# Patient Record
Sex: Male | Born: 1945 | Race: Black or African American | Hispanic: No | Smoking: Current every day smoker
Health system: Southern US, Community
[De-identification: ages and names within clinical notes are randomized; demographics above are authoritative.]

## PROBLEM LIST (undated history)

## (undated) DIAGNOSIS — R911 Solitary pulmonary nodule: Secondary | ICD-10-CM

## (undated) DIAGNOSIS — R918 Other nonspecific abnormal finding of lung field: Secondary | ICD-10-CM

## (undated) DIAGNOSIS — Z923 Personal history of irradiation: Secondary | ICD-10-CM

## (undated) DIAGNOSIS — I639 Cerebral infarction, unspecified: Secondary | ICD-10-CM

## (undated) DIAGNOSIS — R0902 Hypoxemia: Secondary | ICD-10-CM

## (undated) DIAGNOSIS — J449 Chronic obstructive pulmonary disease, unspecified: Secondary | ICD-10-CM

## (undated) DIAGNOSIS — I10 Essential (primary) hypertension: Secondary | ICD-10-CM

## (undated) DIAGNOSIS — E785 Hyperlipidemia, unspecified: Secondary | ICD-10-CM

## (undated) DIAGNOSIS — C3490 Malignant neoplasm of unspecified part of unspecified bronchus or lung: Secondary | ICD-10-CM

## (undated) HISTORY — PX: TONSILLECTOMY: SUR1361

## (undated) HISTORY — DX: Hypoxemia: R09.02

## (undated) HISTORY — DX: Chronic obstructive pulmonary disease, unspecified: J44.9

## (undated) HISTORY — DX: Cerebral infarction, unspecified: I63.9

## (undated) HISTORY — DX: Solitary pulmonary nodule: R91.1

## (undated) HISTORY — DX: Essential (primary) hypertension: I10

## (undated) HISTORY — DX: Hyperlipidemia, unspecified: E78.5

## (undated) HISTORY — DX: Malignant neoplasm of unspecified part of unspecified bronchus or lung: C34.90

## (undated) HISTORY — DX: Personal history of irradiation: Z92.3

## (undated) HISTORY — DX: Other nonspecific abnormal finding of lung field: R91.8

---

## 1997-10-20 ENCOUNTER — Emergency Department (HOSPITAL_COMMUNITY): Admission: EM | Admit: 1997-10-20 | Discharge: 1997-10-20 | Payer: Self-pay | Admitting: Emergency Medicine

## 1998-03-05 ENCOUNTER — Inpatient Hospital Stay (HOSPITAL_COMMUNITY): Admission: EM | Admit: 1998-03-05 | Discharge: 1998-03-10 | Payer: Self-pay | Admitting: Emergency Medicine

## 1998-03-12 ENCOUNTER — Encounter: Admission: RE | Admit: 1998-03-12 | Discharge: 1998-03-12 | Payer: Self-pay | Admitting: Hematology and Oncology

## 1998-05-25 ENCOUNTER — Inpatient Hospital Stay (HOSPITAL_COMMUNITY): Admission: EM | Admit: 1998-05-25 | Discharge: 1998-05-26 | Payer: Self-pay | Admitting: *Deleted

## 1998-05-25 ENCOUNTER — Encounter: Payer: Self-pay | Admitting: Internal Medicine

## 1998-05-25 ENCOUNTER — Encounter: Payer: Self-pay | Admitting: Emergency Medicine

## 1998-07-01 ENCOUNTER — Encounter: Admission: RE | Admit: 1998-07-01 | Discharge: 1998-07-01 | Payer: Self-pay | Admitting: Hematology and Oncology

## 1999-01-03 ENCOUNTER — Encounter: Payer: Self-pay | Admitting: Emergency Medicine

## 1999-01-03 ENCOUNTER — Emergency Department (HOSPITAL_COMMUNITY): Admission: EM | Admit: 1999-01-03 | Discharge: 1999-01-03 | Payer: Self-pay | Admitting: Emergency Medicine

## 2002-03-09 ENCOUNTER — Encounter: Payer: Self-pay | Admitting: *Deleted

## 2002-03-09 ENCOUNTER — Emergency Department (HOSPITAL_COMMUNITY): Admission: EM | Admit: 2002-03-09 | Discharge: 2002-03-09 | Payer: Self-pay | Admitting: Emergency Medicine

## 2003-03-23 ENCOUNTER — Emergency Department (HOSPITAL_COMMUNITY): Admission: EM | Admit: 2003-03-23 | Discharge: 2003-03-23 | Payer: Self-pay | Admitting: Emergency Medicine

## 2005-02-09 ENCOUNTER — Ambulatory Visit (HOSPITAL_COMMUNITY): Admission: RE | Admit: 2005-02-09 | Discharge: 2005-02-09 | Payer: Self-pay | Admitting: Internal Medicine

## 2005-02-09 ENCOUNTER — Ambulatory Visit: Payer: Self-pay | Admitting: Internal Medicine

## 2005-08-03 ENCOUNTER — Encounter: Payer: Self-pay | Admitting: Emergency Medicine

## 2005-08-31 ENCOUNTER — Emergency Department (HOSPITAL_COMMUNITY): Admission: EM | Admit: 2005-08-31 | Discharge: 2005-08-31 | Payer: Self-pay | Admitting: Emergency Medicine

## 2006-02-16 ENCOUNTER — Emergency Department (HOSPITAL_COMMUNITY): Admission: EM | Admit: 2006-02-16 | Discharge: 2006-02-16 | Payer: Self-pay | Admitting: Emergency Medicine

## 2007-05-02 ENCOUNTER — Emergency Department (HOSPITAL_COMMUNITY): Admission: EM | Admit: 2007-05-02 | Discharge: 2007-05-02 | Payer: Self-pay | Admitting: Emergency Medicine

## 2007-06-14 ENCOUNTER — Ambulatory Visit: Payer: Self-pay | Admitting: Internal Medicine

## 2007-06-14 DIAGNOSIS — I10 Essential (primary) hypertension: Secondary | ICD-10-CM | POA: Insufficient documentation

## 2007-06-14 LAB — CONVERTED CEMR LAB
Bilirubin Urine: NEGATIVE
Chloride: 102 meq/L (ref 96–112)
Creatinine, Ser: 0.9 mg/dL (ref 0.40–1.50)
Glucose, Urine, Semiquant: NEGATIVE
Potassium: 4.2 meq/L (ref 3.5–5.3)
Specific Gravity, Urine: 1.015
Urobilinogen, UA: NEGATIVE
pH: 6.5

## 2007-06-22 ENCOUNTER — Ambulatory Visit: Payer: Self-pay | Admitting: Internal Medicine

## 2007-07-06 ENCOUNTER — Ambulatory Visit: Payer: Self-pay | Admitting: Internal Medicine

## 2007-07-25 ENCOUNTER — Ambulatory Visit: Payer: Self-pay | Admitting: *Deleted

## 2007-08-03 ENCOUNTER — Ambulatory Visit: Payer: Self-pay | Admitting: Internal Medicine

## 2007-08-17 ENCOUNTER — Ambulatory Visit: Payer: Self-pay | Admitting: Internal Medicine

## 2007-12-11 ENCOUNTER — Encounter (INDEPENDENT_AMBULATORY_CARE_PROVIDER_SITE_OTHER): Payer: Self-pay | Admitting: *Deleted

## 2007-12-25 ENCOUNTER — Telehealth (INDEPENDENT_AMBULATORY_CARE_PROVIDER_SITE_OTHER): Payer: Self-pay | Admitting: Internal Medicine

## 2007-12-26 ENCOUNTER — Ambulatory Visit: Payer: Self-pay | Admitting: Internal Medicine

## 2008-01-03 ENCOUNTER — Encounter (INDEPENDENT_AMBULATORY_CARE_PROVIDER_SITE_OTHER): Payer: Self-pay | Admitting: Internal Medicine

## 2008-01-22 ENCOUNTER — Encounter (INDEPENDENT_AMBULATORY_CARE_PROVIDER_SITE_OTHER): Payer: Self-pay | Admitting: *Deleted

## 2008-02-16 ENCOUNTER — Emergency Department (HOSPITAL_COMMUNITY): Admission: EM | Admit: 2008-02-16 | Discharge: 2008-02-16 | Payer: Self-pay | Admitting: Emergency Medicine

## 2010-02-05 ENCOUNTER — Inpatient Hospital Stay (HOSPITAL_COMMUNITY)
Admission: EM | Admit: 2010-02-05 | Discharge: 2010-03-09 | Payer: Self-pay | Source: Home / Self Care | Attending: Internal Medicine | Admitting: Internal Medicine

## 2010-02-05 ENCOUNTER — Ambulatory Visit: Payer: Self-pay | Admitting: Cardiology

## 2010-02-05 ENCOUNTER — Encounter (INDEPENDENT_AMBULATORY_CARE_PROVIDER_SITE_OTHER): Payer: Self-pay | Admitting: Internal Medicine

## 2010-02-06 ENCOUNTER — Encounter (INDEPENDENT_AMBULATORY_CARE_PROVIDER_SITE_OTHER): Payer: Self-pay | Admitting: Internal Medicine

## 2010-02-10 ENCOUNTER — Encounter (INDEPENDENT_AMBULATORY_CARE_PROVIDER_SITE_OTHER): Payer: Self-pay | Admitting: Internal Medicine

## 2010-02-18 ENCOUNTER — Telehealth (INDEPENDENT_AMBULATORY_CARE_PROVIDER_SITE_OTHER): Payer: Self-pay | Admitting: Internal Medicine

## 2010-03-03 DIAGNOSIS — I69991 Dysphagia following unspecified cerebrovascular disease: Secondary | ICD-10-CM | POA: Insufficient documentation

## 2010-03-03 DIAGNOSIS — I69959 Hemiplegia and hemiparesis following unspecified cerebrovascular disease affecting unspecified side: Secondary | ICD-10-CM | POA: Insufficient documentation

## 2010-04-27 ENCOUNTER — Ambulatory Visit: Admit: 2010-04-27 | Payer: Self-pay | Admitting: Internal Medicine

## 2010-05-06 NOTE — Progress Notes (Signed)
Summary: HAD A STROKE  Phone Note Other Incoming Call back at [phone] OR [phone]   Caller: STEPDAUGHTER- SOPHIA HARPER Summary of Call: MULBERRY PT. MS HARPER CALLED TO LET YOU KNOW THAT MR Mcauliff IN [place], HE HAD A STROKE. SHE SAYS THAT IF YOU NEED TO TO CALL HER YOU CAN. Initial call taken by: Leodis Rains,  February 18, 2010 9:23 AM  Follow-up for Phone Call        Was admitted 02/05/10 and is still in the hospital, will be going to rehab after discharge.  Need to get him into rehab -- is there anything we can do to help?  Has no medicare or medicaid, can only go for two weeks then needs to go to another facility.  Only has use of of right side.  Is working with social workers at the hospital, but could use any assistance available to get him placed.  Hospital records are on your desk. Follow-up by: Dutch Quint RN,  February 18, 2010 10:11 AM  Additional Follow-up for Phone Call Additional follow up Details #1::        They can apply for disability and see if they can get help through that, but I believe that will take a while.  I believe someone has to be disabled for a period of time before they can get Medicaid or Medicare, but it is worth a try--I'm assuming they are already looking into this.  I am not aware of any place in particular to send him. He has not been here in over 2 years, so I am unsure as to what his family support system is Additional Follow-up by: Julieanne Manson MD,  February 19, 2010 12:08 PM    Additional Follow-up for Phone Call Additional follow up Details #2::    Left message on answer machine for [name] to return call. Gaylyn Cheers RN  February 19, 2010 5:24 PM  Spoke with Joretta Bachelor gave her info Dr. Delrae Alfred had suggested. Also recommended they work directly with the hospital social worker, which she said they have.   Follow-up by: Gaylyn Cheers RN,  February 22, 2010 12:54 PM

## 2010-05-12 NOTE — Letter (Signed)
Summary: Discharge Summary  Discharge Summary   Imported By: Arta Bruce 05/06/2010 10:54:17  _____________________________________________________________________  External Attachment:    Type:   Image     Comment:   External Document

## 2010-06-01 ENCOUNTER — Encounter (INDEPENDENT_AMBULATORY_CARE_PROVIDER_SITE_OTHER): Payer: Self-pay | Admitting: Internal Medicine

## 2010-06-01 ENCOUNTER — Telehealth (INDEPENDENT_AMBULATORY_CARE_PROVIDER_SITE_OTHER): Payer: Self-pay | Admitting: Internal Medicine

## 2010-06-08 ENCOUNTER — Telehealth (INDEPENDENT_AMBULATORY_CARE_PROVIDER_SITE_OTHER): Payer: Self-pay | Admitting: Internal Medicine

## 2010-06-10 NOTE — Progress Notes (Signed)
Summary: Needs meds refills s/p nursing home discharge  Phone Note Call from Patient   Summary of Call: Daughter Sheryle Spray called re father -- he currently hasMedicaid, only lasting until April.  Was just discharged from the nursing home today.  Had stroke in November 2011.  Needs medications refilled -- has only two more pills left of medications.  Is on blood-thinners and blood pressure medications.  Very upset over father's status re health -- Dr. Delrae Alfred had no appointments available this week -- placed in open appointment with Dr. Clelia Croft for 06/02/10.  Initial call taken by: Dutch Quint RN,  June 01, 2010 3:30 PM  Follow-up for Phone Call        This is going to be difficult for Dr. Clelia Croft to get through his records.  Can they not just bring in his meds and we can fill until I have an appt.--if all they need is med refills?  Called and spoke with Aggie Cosier to see if this could be arranged. Follow-up by: Julieanne Manson MD,  June 02, 2010 9:20 AM  Additional Follow-up for Phone Call Additional follow up Details #1::        Pt. arrived with daughter.  Spoke with daughter, pt. only c/o currently is feeling cold.  Discharge medications were ASA 81mg ., HCTZ 25 mg., Lisinopril 10 mg., Toprol XL 25 mg., Plavix 75 mg. and Zocor 40 mg. - all once daily.   Meds per written Rx are as follows:  Aspirin 81 mg. 1 tab by mouth once daily Tylenol 325 mg. 2 tabs by mouth four times a day if needed HCTZ 25 mg. 1 tab by mouth once daily Prinivil 10 mg. 1 tab by mouth once daily Zocor 40 mg. 1 tab by mouth once daily Toprol XL 25 mg. 1 tab by mouth once daily Plavix 75 mg. 1 tab by mouth once daily  They use [facility] on [address].   Additional Follow-up by: Dutch Quint RN,  June 02, 2010 10:25 AM    New/Updated Medications: ASPIR-LOW 81 MG TBEC (ASPIRIN) 1 tab by mouth with meal daily HYDROCHLOROTHIAZIDE 25 MG TABS (HYDROCHLOROTHIAZIDE) 1 tab by mouth in morning daily LISINOPRIL 10 MG  TABS (LISINOPRIL) 1 tab by mouth daily TOPROL XL 25 MG XR24H-TAB (METOPROLOL SUCCINATE) 1 tab by mouth daily PLAVIX 75 MG TABS (CLOPIDOGREL BISULFATE) 1 tab by mouth daily PRAVASTATIN SODIUM 40 MG TABS (PRAVASTATIN SODIUM) 1 tab by mouth daily Prescriptions: PRAVASTATIN SODIUM 40 MG TABS (PRAVASTATIN SODIUM) 1 tab by mouth daily  #30 x 4   Entered and Authorized by:   Julieanne Manson MD   Signed by:   Julieanne Manson MD on 06/02/2010   Method used:   Electronically to        [facility] [phone]* (retail)       [address]       [place], [place]  [address]       Ph: [phone]       Fax: [phone]   RxID:   [id number] PLAVIX 75 MG TABS (CLOPIDOGREL BISULFATE) 1 tab by mouth daily  #30 x 4   Entered and Authorized by:   Julieanne Manson MD   Signed by:   Julieanne Manson MD on 06/02/2010   Method used:   Electronically to        [facility] [address] [phone]* (retail)       [address]       [place], [place]  [address]  Ph: [phone]       Fax: [phone]   RxID:   [id number] TOPROL XL 25 MG XR24H-TAB (METOPROLOL SUCCINATE) 1 tab by mouth daily  #30 x 4   Entered and Authorized by:   Julieanne Manson MD   Signed by:   Julieanne Manson MD on 06/02/2010   Method used:   Electronically to        [facility] [phone]* (retail)       [address]       [place], [place]  [address]       Ph: [phone]       Fax: [phone]   RxID:   [id number] LISINOPRIL 10 MG TABS (LISINOPRIL) 1 tab by mouth daily  #30 x 4   Entered and Authorized by:   Julieanne Manson MD   Signed by:   Julieanne Manson MD on 06/02/2010   Method used:   Electronically to        [facility] [phone]* (retail)       [address]       [place], [place]  [address]       Ph: [phone]       Fax: [phone]   RxID:   [id number] HYDROCHLOROTHIAZIDE 25 MG TABS (HYDROCHLOROTHIAZIDE) 1 tab by mouth in morning daily  #30 x 4   Entered and  Authorized by:   Julieanne Manson MD   Signed by:   Julieanne Manson MD on 06/02/2010   Method used:   Electronically to        [facility] [address] [phone]* (retail)       [address]       [place], [place]  [address]       Ph: [phone]       Fax: [phone]   RxID:   [id number] ASPIR-LOW 81 MG TBEC (ASPIRIN) 1 tab by mouth with meal daily  #30 x 4   Entered and Authorized by:   Julieanne Manson MD   Signed by:   Julieanne Manson MD on 06/02/2010   Method used:   Electronically to        [facility] [address] [phone]* (retail)       [address]       [place], [place]  [address]       Ph: [phone]       Fax: [phone]   RxID:   [id number]

## 2010-06-10 NOTE — Letter (Signed)
Summary: SCRIPT  SCRIPT   Imported By: Arta Bruce 06/02/2010 12:17:39  _____________________________________________________________________  External Attachment:    Type:   Image     Comment:   External Document

## 2010-06-11 ENCOUNTER — Encounter (INDEPENDENT_AMBULATORY_CARE_PROVIDER_SITE_OTHER): Payer: Self-pay | Admitting: Internal Medicine

## 2010-06-11 ENCOUNTER — Encounter: Payer: Self-pay | Admitting: Internal Medicine

## 2010-06-11 DIAGNOSIS — M542 Cervicalgia: Secondary | ICD-10-CM | POA: Insufficient documentation

## 2010-06-11 DIAGNOSIS — F17200 Nicotine dependence, unspecified, uncomplicated: Secondary | ICD-10-CM | POA: Insufficient documentation

## 2010-06-11 DIAGNOSIS — E7800 Pure hypercholesterolemia, unspecified: Secondary | ICD-10-CM | POA: Insufficient documentation

## 2010-06-11 LAB — CONVERTED CEMR LAB: Blood Glucose, Fingerstick: 74

## 2010-06-15 DIAGNOSIS — J4489 Other specified chronic obstructive pulmonary disease: Secondary | ICD-10-CM | POA: Insufficient documentation

## 2010-06-15 DIAGNOSIS — J449 Chronic obstructive pulmonary disease, unspecified: Secondary | ICD-10-CM | POA: Insufficient documentation

## 2010-06-15 LAB — CBC
HCT: 39 % (ref 39.0–52.0)
HCT: 44.3 % (ref 39.0–52.0)
HCT: 47.5 % (ref 39.0–52.0)
HCT: 48.8 % (ref 39.0–52.0)
HCT: 49.3 % (ref 39.0–52.0)
HCT: 49.7 % (ref 39.0–52.0)
HCT: 50.9 % (ref 39.0–52.0)
Hemoglobin: 14.9 g/dL (ref 13.0–17.0)
Hemoglobin: 15.2 g/dL (ref 13.0–17.0)
Hemoglobin: 15.8 g/dL (ref 13.0–17.0)
Hemoglobin: 16.8 g/dL (ref 13.0–17.0)
Hemoglobin: 17 g/dL (ref 13.0–17.0)
Hemoglobin: 17.1 g/dL — ABNORMAL HIGH (ref 13.0–17.0)
Hemoglobin: 17.3 g/dL — ABNORMAL HIGH (ref 13.0–17.0)
Hemoglobin: 18.3 g/dL — ABNORMAL HIGH (ref 13.0–17.0)
MCH: 32.9 pg (ref 26.0–34.0)
MCH: 32.9 pg (ref 26.0–34.0)
MCH: 33.1 pg (ref 26.0–34.0)
MCH: 33.5 pg (ref 26.0–34.0)
MCH: 33.7 pg (ref 26.0–34.0)
MCH: 33.8 pg (ref 26.0–34.0)
MCH: 34.1 pg — ABNORMAL HIGH (ref 26.0–34.0)
MCHC: 33.9 g/dL (ref 30.0–36.0)
MCHC: 34.1 g/dL (ref 30.0–36.0)
MCHC: 34.2 g/dL (ref 30.0–36.0)
MCHC: 34.3 g/dL (ref 30.0–36.0)
MCHC: 34.8 g/dL (ref 30.0–36.0)
MCHC: 35 g/dL (ref 30.0–36.0)
MCHC: 35.1 g/dL (ref 30.0–36.0)
MCHC: 36 g/dL (ref 30.0–36.0)
MCV: 95.1 fL (ref 78.0–100.0)
MCV: 95.2 fL (ref 78.0–100.0)
MCV: 95.4 fL (ref 78.0–100.0)
MCV: 95.9 fL (ref 78.0–100.0)
MCV: 97.2 fL (ref 78.0–100.0)
MCV: 97.8 fL (ref 78.0–100.0)
Platelets: 156 10*3/uL (ref 150–400)
Platelets: 162 10*3/uL (ref 150–400)
Platelets: 187 10*3/uL (ref 150–400)
Platelets: 263 10*3/uL (ref 150–400)
Platelets: 272 10*3/uL (ref 150–400)
Platelets: 294 10*3/uL (ref 150–400)
RBC: 4.33 MIL/uL (ref 4.22–5.81)
RBC: 4.62 MIL/uL (ref 4.22–5.81)
RBC: 4.78 MIL/uL (ref 4.22–5.81)
RBC: 4.98 MIL/uL (ref 4.22–5.81)
RBC: 5.08 MIL/uL (ref 4.22–5.81)
RBC: 5.13 MIL/uL (ref 4.22–5.81)
RBC: 5.38 MIL/uL (ref 4.22–5.81)
RDW: 12.3 % (ref 11.5–15.5)
RDW: 12.4 % (ref 11.5–15.5)
RDW: 12.5 % (ref 11.5–15.5)
RDW: 13.1 % (ref 11.5–15.5)
RDW: 13.3 % (ref 11.5–15.5)
RDW: 13.3 % (ref 11.5–15.5)
RDW: 13.4 % (ref 11.5–15.5)
WBC: 5.6 10*3/uL (ref 4.0–10.5)
WBC: 5.9 10*3/uL (ref 4.0–10.5)
WBC: 6.3 10*3/uL (ref 4.0–10.5)
WBC: 6.3 10*3/uL (ref 4.0–10.5)

## 2010-06-15 LAB — BASIC METABOLIC PANEL
BUN: 10 mg/dL (ref 6–23)
BUN: 11 mg/dL (ref 6–23)
BUN: 11 mg/dL (ref 6–23)
BUN: 15 mg/dL (ref 6–23)
BUN: 18 mg/dL (ref 6–23)
BUN: 18 mg/dL (ref 6–23)
BUN: 19 mg/dL (ref 6–23)
BUN: 20 mg/dL (ref 6–23)
BUN: 23 mg/dL (ref 6–23)
BUN: 27 mg/dL — ABNORMAL HIGH (ref 6–23)
CO2: 22 mEq/L (ref 19–32)
CO2: 24 mEq/L (ref 19–32)
CO2: 25 mEq/L (ref 19–32)
CO2: 26 mEq/L (ref 19–32)
CO2: 26 mEq/L (ref 19–32)
CO2: 26 mEq/L (ref 19–32)
CO2: 26 mEq/L (ref 19–32)
CO2: 28 mEq/L (ref 19–32)
CO2: 33 mEq/L — ABNORMAL HIGH (ref 19–32)
Calcium: 10 mg/dL (ref 8.4–10.5)
Calcium: 10.4 mg/dL (ref 8.4–10.5)
Calcium: 8.9 mg/dL (ref 8.4–10.5)
Calcium: 8.9 mg/dL (ref 8.4–10.5)
Calcium: 9.1 mg/dL (ref 8.4–10.5)
Calcium: 9.1 mg/dL (ref 8.4–10.5)
Calcium: 9.2 mg/dL (ref 8.4–10.5)
Calcium: 9.4 mg/dL (ref 8.4–10.5)
Calcium: 9.6 mg/dL (ref 8.4–10.5)
Calcium: 9.7 mg/dL (ref 8.4–10.5)
Calcium: 9.9 mg/dL (ref 8.4–10.5)
Chloride: 102 mEq/L (ref 96–112)
Chloride: 105 mEq/L (ref 96–112)
Chloride: 106 mEq/L (ref 96–112)
Chloride: 106 mEq/L (ref 96–112)
Chloride: 113 mEq/L — ABNORMAL HIGH (ref 96–112)
Chloride: 115 mEq/L — ABNORMAL HIGH (ref 96–112)
Chloride: 118 mEq/L — ABNORMAL HIGH (ref 96–112)
Creatinine, Ser: 0.9 mg/dL (ref 0.4–1.5)
Creatinine, Ser: 0.92 mg/dL (ref 0.4–1.5)
Creatinine, Ser: 0.93 mg/dL (ref 0.4–1.5)
Creatinine, Ser: 0.94 mg/dL (ref 0.4–1.5)
Creatinine, Ser: 0.97 mg/dL (ref 0.4–1.5)
Creatinine, Ser: 0.97 mg/dL (ref 0.4–1.5)
Creatinine, Ser: 0.98 mg/dL (ref 0.4–1.5)
Creatinine, Ser: 1 mg/dL (ref 0.4–1.5)
Creatinine, Ser: 1.02 mg/dL (ref 0.4–1.5)
Creatinine, Ser: 1.03 mg/dL (ref 0.4–1.5)
Creatinine, Ser: 1.07 mg/dL (ref 0.4–1.5)
Creatinine, Ser: 1.09 mg/dL (ref 0.4–1.5)
GFR calc Af Amer: >60 mL/min (ref >60)
GFR calc Af Amer: >60 mL/min (ref >60)
GFR calc Af Amer: >60 mL/min (ref >60)
GFR calc Af Amer: >60 mL/min (ref >60)
GFR calc Af Amer: >60 mL/min (ref >60)
GFR calc Af Amer: >60 mL/min (ref >60)
GFR calc Af Amer: >60 mL/min (ref >60)
GFR calc Af Amer: >60 mL/min (ref >60)
GFR calc Af Amer: >60 mL/min (ref >60)
GFR calc Af Amer: >60 mL/min (ref >60)
GFR calc non Af Amer: >60 mL/min (ref >60)
GFR calc non Af Amer: >60 mL/min (ref >60)
GFR calc non Af Amer: >60 mL/min (ref >60)
GFR calc non Af Amer: >60 mL/min (ref >60)
GFR calc non Af Amer: >60 mL/min (ref >60)
GFR calc non Af Amer: >60 mL/min (ref >60)
GFR calc non Af Amer: >60 mL/min (ref >60)
GFR calc non Af Amer: >60 mL/min (ref >60)
GFR calc non Af Amer: >60 mL/min (ref >60)
Glucose, Bld: 102 mg/dL — ABNORMAL HIGH (ref 70–99)
Glucose, Bld: 107 mg/dL — ABNORMAL HIGH (ref 70–99)
Glucose, Bld: 115 mg/dL — ABNORMAL HIGH (ref 70–99)
Glucose, Bld: 123 mg/dL — ABNORMAL HIGH (ref 70–99)
Glucose, Bld: 123 mg/dL — ABNORMAL HIGH (ref 70–99)
Glucose, Bld: 159 mg/dL — ABNORMAL HIGH (ref 70–99)
Glucose, Bld: 90 mg/dL (ref 70–99)
Glucose, Bld: 95 mg/dL (ref 70–99)
Glucose, Bld: 96 mg/dL (ref 70–99)
Potassium: 3 mEq/L — ABNORMAL LOW (ref 3.5–5.1)
Potassium: 3.1 mEq/L — ABNORMAL LOW (ref 3.5–5.1)
Potassium: 3.4 mEq/L — ABNORMAL LOW (ref 3.5–5.1)
Potassium: 3.6 mEq/L (ref 3.5–5.1)
Potassium: 3.8 mEq/L (ref 3.5–5.1)
Potassium: 3.9 mEq/L (ref 3.5–5.1)
Potassium: 4 mEq/L (ref 3.5–5.1)
Potassium: 4 mEq/L (ref 3.5–5.1)
Potassium: 4.1 mEq/L (ref 3.5–5.1)
Sodium: 138 mEq/L (ref 135–145)
Sodium: 143 mEq/L (ref 135–145)
Sodium: 144 mEq/L (ref 135–145)
Sodium: 145 mEq/L (ref 135–145)
Sodium: 146 mEq/L — ABNORMAL HIGH (ref 135–145)
Sodium: 146 mEq/L — ABNORMAL HIGH (ref 135–145)
Sodium: 149 mEq/L — ABNORMAL HIGH (ref 135–145)

## 2010-06-15 LAB — GLUCOSE, CAPILLARY
Glucose-Capillary: 100 mg/dL — ABNORMAL HIGH (ref 70–99)
Glucose-Capillary: 101 mg/dL — ABNORMAL HIGH (ref 70–99)
Glucose-Capillary: 102 mg/dL — ABNORMAL HIGH (ref 70–99)
Glucose-Capillary: 102 mg/dL — ABNORMAL HIGH (ref 70–99)
Glucose-Capillary: 103 mg/dL — ABNORMAL HIGH (ref 70–99)
Glucose-Capillary: 103 mg/dL — ABNORMAL HIGH (ref 70–99)
Glucose-Capillary: 106 mg/dL — ABNORMAL HIGH (ref 70–99)
Glucose-Capillary: 108 mg/dL — ABNORMAL HIGH (ref 70–99)
Glucose-Capillary: 108 mg/dL — ABNORMAL HIGH (ref 70–99)
Glucose-Capillary: 109 mg/dL — ABNORMAL HIGH (ref 70–99)
Glucose-Capillary: 110 mg/dL — ABNORMAL HIGH (ref 70–99)
Glucose-Capillary: 110 mg/dL — ABNORMAL HIGH (ref 70–99)
Glucose-Capillary: 111 mg/dL — ABNORMAL HIGH (ref 70–99)
Glucose-Capillary: 112 mg/dL — ABNORMAL HIGH (ref 70–99)
Glucose-Capillary: 112 mg/dL — ABNORMAL HIGH (ref 70–99)
Glucose-Capillary: 114 mg/dL — ABNORMAL HIGH (ref 70–99)
Glucose-Capillary: 117 mg/dL — ABNORMAL HIGH (ref 70–99)
Glucose-Capillary: 120 mg/dL — ABNORMAL HIGH (ref 70–99)
Glucose-Capillary: 121 mg/dL — ABNORMAL HIGH (ref 70–99)
Glucose-Capillary: 121 mg/dL — ABNORMAL HIGH (ref 70–99)
Glucose-Capillary: 121 mg/dL — ABNORMAL HIGH (ref 70–99)
Glucose-Capillary: 122 mg/dL — ABNORMAL HIGH (ref 70–99)
Glucose-Capillary: 123 mg/dL — ABNORMAL HIGH (ref 70–99)
Glucose-Capillary: 123 mg/dL — ABNORMAL HIGH (ref 70–99)
Glucose-Capillary: 126 mg/dL — ABNORMAL HIGH (ref 70–99)
Glucose-Capillary: 127 mg/dL — ABNORMAL HIGH (ref 70–99)
Glucose-Capillary: 127 mg/dL — ABNORMAL HIGH (ref 70–99)
Glucose-Capillary: 133 mg/dL — ABNORMAL HIGH (ref 70–99)
Glucose-Capillary: 133 mg/dL — ABNORMAL HIGH (ref 70–99)
Glucose-Capillary: 133 mg/dL — ABNORMAL HIGH (ref 70–99)
Glucose-Capillary: 136 mg/dL — ABNORMAL HIGH (ref 70–99)
Glucose-Capillary: 136 mg/dL — ABNORMAL HIGH (ref 70–99)
Glucose-Capillary: 137 mg/dL — ABNORMAL HIGH (ref 70–99)
Glucose-Capillary: 141 mg/dL — ABNORMAL HIGH (ref 70–99)
Glucose-Capillary: 143 mg/dL — ABNORMAL HIGH (ref 70–99)
Glucose-Capillary: 148 mg/dL — ABNORMAL HIGH (ref 70–99)
Glucose-Capillary: 152 mg/dL — ABNORMAL HIGH (ref 70–99)
Glucose-Capillary: 71 mg/dL (ref 70–99)
Glucose-Capillary: 78 mg/dL (ref 70–99)
Glucose-Capillary: 80 mg/dL (ref 70–99)
Glucose-Capillary: 83 mg/dL (ref 70–99)
Glucose-Capillary: 86 mg/dL (ref 70–99)
Glucose-Capillary: 87 mg/dL (ref 70–99)
Glucose-Capillary: 88 mg/dL (ref 70–99)
Glucose-Capillary: 89 mg/dL (ref 70–99)
Glucose-Capillary: 89 mg/dL (ref 70–99)
Glucose-Capillary: 91 mg/dL (ref 70–99)
Glucose-Capillary: 92 mg/dL (ref 70–99)
Glucose-Capillary: 96 mg/dL (ref 70–99)
Glucose-Capillary: 98 mg/dL (ref 70–99)
Glucose-Capillary: 99 mg/dL (ref 70–99)

## 2010-06-15 LAB — MAGNESIUM
Magnesium: 1.7 mg/dL (ref 1.5–2.5)
Magnesium: 1.7 mg/dL (ref 1.5–2.5)
Magnesium: 1.9 mg/dL (ref 1.5–2.5)
Magnesium: 1.9 mg/dL (ref 1.5–2.5)
Magnesium: 2.1 mg/dL (ref 1.5–2.5)
Magnesium: 2.2 mg/dL (ref 1.5–2.5)

## 2010-06-15 LAB — BLOOD GAS, ARTERIAL
Drawn by: 270091
O2 Content: 3 L/min
TCO2: 25.7 mmol/L (ref 0–100)
pCO2 arterial: 48.3 mmHg — ABNORMAL HIGH (ref 35.0–45.0)
pH, Arterial: 7.321 — ABNORMAL LOW (ref 7.350–7.450)

## 2010-06-15 LAB — DIFFERENTIAL
Lymphocytes Relative: 35 % (ref 12–46)
Lymphs Abs: 1.9 10*3/uL (ref 0.7–4.0)
Monocytes Absolute: 0.5 10*3/uL (ref 0.1–1.0)
Monocytes Relative: 10 % (ref 3–12)
Neutro Abs: 2.8 10*3/uL (ref 1.7–7.7)

## 2010-06-15 LAB — COMPREHENSIVE METABOLIC PANEL
AST: 22 U/L (ref 0–37)
BUN: 24 mg/dL — ABNORMAL HIGH (ref 6–23)
CO2: 27 mEq/L (ref 19–32)
Chloride: 105 mEq/L (ref 96–112)
Creatinine, Ser: 1.09 mg/dL (ref 0.4–1.5)
GFR calc non Af Amer: >60 mL/min (ref >60)
Total Bilirubin: 0.9 mg/dL (ref 0.3–1.2)

## 2010-06-15 LAB — LIPID PANEL
HDL: 74 mg/dL (ref >39)
Triglycerides: 100 mg/dL (ref <150)

## 2010-06-15 LAB — CARDIAC PANEL(CRET KIN+CKTOT+MB+TROPI)
CK, MB: 2.3 ng/mL (ref 0.3–4.0)
Relative Index: 1.3 (ref 0.0–2.5)
Relative Index: 1.4 (ref 0.0–2.5)

## 2010-06-15 LAB — PHOSPHORUS
Phosphorus: 2.3 mg/dL (ref 2.3–4.6)
Phosphorus: 3.8 mg/dL (ref 2.3–4.6)

## 2010-06-15 LAB — POCT CARDIAC MARKERS
CKMB, poc: 1.4 ng/mL (ref 1.0–8.0)
Myoglobin, poc: 102 ng/mL (ref 12–200)
Troponin i, poc: <0.05 ng/mL (ref 0.00–0.09)

## 2010-06-15 LAB — CK TOTAL AND CKMB (NOT AT ARMC): Total CK: 166 U/L (ref 7–232)

## 2010-06-15 LAB — SAMPLE TO BLOOD BANK

## 2010-06-15 LAB — APTT: aPTT: 32 seconds (ref 24–37)

## 2010-06-15 LAB — PROTIME-INR: INR: 0.96 (ref 0.00–1.49)

## 2010-06-15 LAB — HEPARIN LEVEL (UNFRACTIONATED): Heparin Unfractionated: <0.1 IU/mL — ABNORMAL LOW (ref 0.30–0.70)

## 2010-06-16 ENCOUNTER — Encounter (INDEPENDENT_AMBULATORY_CARE_PROVIDER_SITE_OTHER): Payer: Self-pay | Admitting: Internal Medicine

## 2010-06-22 ENCOUNTER — Telehealth (INDEPENDENT_AMBULATORY_CARE_PROVIDER_SITE_OTHER): Payer: Self-pay | Admitting: Internal Medicine

## 2010-06-22 ENCOUNTER — Emergency Department (HOSPITAL_COMMUNITY)
Admission: EM | Admit: 2010-06-22 | Discharge: 2010-06-22 | Disposition: A | Discharge status: Home / Self Care | Payer: Medicaid Other | Attending: Emergency Medicine | Admitting: Emergency Medicine

## 2010-06-22 ENCOUNTER — Emergency Department (HOSPITAL_COMMUNITY): Payer: Medicaid Other

## 2010-06-22 DIAGNOSIS — Z8673 Personal history of transient ischemic attack (TIA), and cerebral infarction without residual deficits: Secondary | ICD-10-CM | POA: Insufficient documentation

## 2010-06-22 DIAGNOSIS — Z79899 Other long term (current) drug therapy: Secondary | ICD-10-CM | POA: Insufficient documentation

## 2010-06-22 DIAGNOSIS — I959 Hypotension, unspecified: Secondary | ICD-10-CM | POA: Insufficient documentation

## 2010-06-22 DIAGNOSIS — Z7982 Long term (current) use of aspirin: Secondary | ICD-10-CM | POA: Insufficient documentation

## 2010-06-22 DIAGNOSIS — M79609 Pain in unspecified limb: Secondary | ICD-10-CM | POA: Insufficient documentation

## 2010-06-22 DIAGNOSIS — R51 Headache: Secondary | ICD-10-CM | POA: Insufficient documentation

## 2010-06-22 DIAGNOSIS — R29898 Other symptoms and signs involving the musculoskeletal system: Secondary | ICD-10-CM | POA: Insufficient documentation

## 2010-06-22 DIAGNOSIS — I10 Essential (primary) hypertension: Secondary | ICD-10-CM | POA: Insufficient documentation

## 2010-06-22 LAB — CBC
Hemoglobin: 14.5 g/dL (ref 13.0–17.0)
MCH: 32.2 pg (ref 26.0–34.0)
Platelets: 261 10*3/uL (ref 150–400)
RBC: 4.5 MIL/uL (ref 4.22–5.81)
WBC: 5.3 10*3/uL (ref 4.0–10.5)

## 2010-06-22 LAB — POCT CARDIAC MARKERS
Myoglobin, poc: 50.7 ng/mL (ref 12–200)
Troponin i, poc: 0.37 ng/mL (ref 0.00–0.09)

## 2010-06-22 LAB — DIFFERENTIAL
Basophils Absolute: 0.1 10*3/uL (ref 0.0–0.1)
Basophils Relative: 2 % — ABNORMAL HIGH (ref 0–1)
Eosinophils Absolute: 0.3 10*3/uL (ref 0.0–0.7)
Monocytes Relative: 8 % (ref 3–12)
Neutro Abs: 2.1 10*3/uL (ref 1.7–7.7)
Neutrophils Relative %: 39 % — ABNORMAL LOW (ref 43–77)

## 2010-06-22 LAB — URINALYSIS, ROUTINE W REFLEX MICROSCOPIC
Hgb urine dipstick: NEGATIVE
Ketones, ur: 15 mg/dL — AB
Specific Gravity, Urine: 1.018 (ref 1.005–1.030)
Urobilinogen, UA: 1 mg/dL (ref 0.0–1.0)

## 2010-06-22 LAB — COMPREHENSIVE METABOLIC PANEL
ALT: 15 U/L (ref 0–53)
Alkaline Phosphatase: 69 U/L (ref 39–117)
BUN: 33 mg/dL — ABNORMAL HIGH (ref 6–23)
CO2: 24 mEq/L (ref 19–32)
Calcium: 9.5 mg/dL (ref 8.4–10.5)
GFR calc non Af Amer: 49 mL/min — ABNORMAL LOW (ref >60)
Glucose, Bld: 99 mg/dL (ref 70–99)
Potassium: 3.3 mEq/L — ABNORMAL LOW (ref 3.5–5.1)
Sodium: 139 mEq/L (ref 135–145)

## 2010-06-22 LAB — LIPASE, BLOOD: Lipase: 44 U/L (ref 11–59)

## 2010-06-22 NOTE — Letter (Signed)
[facility]: [facility]   Imported By: Arta Bruce 06/16/2010 10:59:18  _____________________________________________________________________  External Attachment:    Type:   Image     Comment:   External Document

## 2010-06-22 NOTE — Assessment & Plan Note (Signed)
Summary: F/u for stroke   Vital Signs:  Patient profile:   65 year old male Height:      685 inches Weight:      127.50 pounds BMI:     0.19 Temp:     98.3 degrees F oral Pulse rate:   80 / minute Pulse rhythm:   regular Resp:     20 per minute BP sitting:   115 / 81  (right arm) Cuff size:   regular  Vitals Entered By: Hale Drone CMA (June 11, 2010 4:48 PM) CC: F/u after having stroke... c/o left arm and continuous HA... Needs form completed for transportation assistance... Ms. Teresita Madura needs a letter from Provider stating that she will be responsible for the pt....  Pain Assessment Patient in pain? yes      CBG Result 74 CBG Device ID non fasting  Does patient need assistance? Functional Status Self care Ambulation Normal   CC:  F/u after having stroke... c/o left arm and continuous HA... Needs form completed for transportation assistance... Ms. Teresita Madura needs a letter from Provider stating that she will be responsible for the pt.... .  History of Present Illness: 1.  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  Needs to be set up with homehealth PT, OT, aide care.  Needs help with dressing, bathing, toileting, food preparation.  Pts stepdaughter is helping with him.  He has no formal medical power of attorney.  Also needs paperwork for [facility] and [employer]. Pt. hospitalized at [facility] from Nov-Dec, then in nursing home until 06/02/10.  Now living in his own home with stepdaughter.  2.  Dysphagia:  on regular diet with soft foods.  To drink from a straw.  To perform Chin tuck with swallow.    3.  Cold all the time, even when anyone else is warm  4.  COPD with chronic cough:  still smoking 2 ppd.    5.  Headache and left shoulder/trap pain.  Does also have hx of post stroke headache documented in discharge summary as well.       Current Medications (verified): 1)  Aspir-Low 81 Mg Tbec (Aspirin) .Marland Kitchen.. 1  Tab By Mouth With Meal Daily 2)  Hydrochlorothiazide 25 Mg Tabs (Hydrochlorothiazide) .Marland Kitchen.. 1 Tab By Mouth in Morning Daily 3)  Lisinopril 10 Mg Tabs (Lisinopril) .Marland Kitchen.. 1 Tab By Mouth Daily 4)  Toprol Xl 25 Mg Xr24h-Tab (Metoprolol Succinate) .Marland Kitchen.. 1 Tab By Mouth Daily 5)  Plavix 75 Mg Tabs (Clopidogrel Bisulfate) .Marland Kitchen.. 1 Tab By Mouth Daily 6)  Pravastatin Sodium 40 Mg Tabs (Pravastatin Sodium) .Marland Kitchen.. 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 Mother, died 62:  not aware of cause of death Father:  never knew Sister, 49:  healthy Brother, died 91s:  kidney failure--ultimately died of MI.  Was a drug and alcohol abuser.  Not certain of cause of kidney failure. No childrean  Social History: Reviewed history from 06/14/2007 and no changes required. Disabled since stroke 2010/02/22 Widower--wife died 10 or more years ago. Lives with stepdaughter since home from NH. Does have a girlfriend.  She checks in on him.  Physical Exam  General:  NAD, thin male,  NAD.  Speaks clearly Eyes:  PERRL, EOMI.  Lungs:  Normal respiratory effort, chest expands symmetrically. Lungs are clear to auscultation, no crackles or wheezes. Heart:  Normal rate and regular rhythm. S1 and S2 normal without gallop, murmur, click, rub or other extra sounds.  Radial pulses normal and equal.   Msk:  Tender over left trap--quit tight.  Tenderness extends up left paracervical musculature to nuchal ridge area--reproduces headache. Neurologic:  Decreased strength in left  arm throughout.  Significantly decreased left grip strength.     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 His updated medication list for this problem includes:    Aspir-low 81 Mg Tbec (Aspirin) .Marland Kitchen... 1 tab by mouth with meal daily    Plavix 75 Mg Tabs (Clopidogrel bisulfate) .Marland Kitchen... 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 1 tab by mouth with meal daily    Plavix 75 Mg Tabs (Clopidogrel bisulfate) .Marland Kitchen... 1 tab by mouth daily  Problem # 3:  TOBACCO ABUSE (ICD-305.1) Stop smoking to help improve stroke risk factors Not sure how motivated pt is. His updated medication list for this problem includes:    Nicotine 21 Mg/24hr Pt24 (Nicotine) .Marland Kitchen... Apply new patch to skin daily for 30 days, then decrease to 14 mg patch    Nicotine 7 Mg/24hr Pt24 (Nicotine) .Marland Kitchen... After 14 mg patches, start 7 mg daily for 14 days, then stop    Nicotine 14 Mg/24hr Pt24 (Nicotine) .Marland Kitchen... After completing 21 mg patches, start 14 mg and change daily for 14 days  Problem # 4:  HYPERCHOLESTEROLEMIA (ICD-272.0) Nonfasting currently-to return for fasting labs His updated medication list for this problem includes:    Pravastatin Sodium 40 Mg Tabs (Pravastatin sodium) .Marland Kitchen... 1 tab by mouth daily  Problem # 5:  HYPERTENSION (ICD-401.9) Controlled His updated medication list for this problem includes:    Hydrochlorothiazide 25 Mg Tabs (Hydrochlorothiazide) .Marland Kitchen... 1 tab by mouth in morning daily    Lisinopril 10 Mg Tabs (Lisinopril) .Marland Kitchen... 1 tab by mouth daily    Toprol Xl 25 Mg Xr24h-tab (Metoprolol succinate) .Marland Kitchen... 1 tab by mouth daily  Problem # 6:  COLD INTOLERANCE (ICD-780.99) Return for CBC and TSH with fasting labs  Problem # 7:  NECK PAIN, LEFT (ICD-723.1)  Suspect this is secondary to mechanics of how he  moves his left arm now. Trap muscle contraction appears to be extending into cervical area and causing radiation of pain for headaches. His updated medication list for this problem includes:    Aspir-low 81 Mg Tbec (Aspirin) .Marland Kitchen... 1 tab by mouth with meal daily  Orders: Physical Therapy Referral (PT)  Complete Medication List: 1)  Aspir-low 81 Mg Tbec (Aspirin) .Marland Kitchen.. 1 tab by mouth with meal daily 2)  Hydrochlorothiazide 25 Mg Tabs (Hydrochlorothiazide) .Marland Kitchen.. 1 tab by mouth in morning daily 3)  Lisinopril 10 Mg Tabs (Lisinopril) .Marland Kitchen.. 1 tab by mouth daily 4)  Toprol Xl 25 Mg Xr24h-tab (Metoprolol succinate) .Marland Kitchen.. 1 tab by mouth daily 5)  Plavix 75 Mg Tabs (Clopidogrel bisulfate) .Marland Kitchen.. 1 tab by mouth daily 6)  Pravastatin Sodium 40 Mg Tabs (Pravastatin sodium) .Marland Kitchen.. 1 tab by mouth daily 7)  Nicotine 21 Mg/24hr Pt24 (Nicotine) .... Apply new patch to skin daily for 30  days, then decrease to 14 mg patch 8)  Nicotine 7 Mg/24hr Pt24 (Nicotine) .... After 14 mg patches, start 7 mg daily for 14 days, then stop 9)  Nicotine 14 Mg/24hr Pt24 (Nicotine) .... After completing 21 mg patches, start 14 mg and change daily for 14 days  Other Orders: Capillary Blood Glucose/CBG (78469) Capillary Blood Glucose/CBG (62952) Capillary Blood Glucose/CBG (84132)  Patient Instructions: 1)  Fasting labs in next 2 weeks:  FLP, CBC, TSH, CMET 2)  Follow up with Dr. Delrae Alfred in 4 months --multiple health concerns 3)  Stop smoking when start patches Prescriptions: NICOTINE 14 MG/24HR PT24 (NICOTINE) After completing 21 mg patches, start 14 mg and change daily for 14 days  #14 x 0   Entered and Authorized by:   Julieanne Manson MD   Signed by:   Julieanne Manson MD on 06/11/2010   Method used:   Electronically to        [facility] [address] (retail)       [address]       [place], [place]  [address]       Ph: [phone]       Fax: [phone]   RxID:   [id number] NICOTINE 7 MG/24HR  PT24 (NICOTINE) After 14 mg patches, start 7 mg daily for 14 days, then stop  #14 x 0   Entered and Authorized by:   Julieanne Manson MD   Signed by:   Julieanne Manson MD on 06/11/2010   Method used:   Electronically to        [facility] [address] [address] (retail)       [address]       [place], [place]  [address]       Ph: [phone]       Fax: [phone]   RxID:   [id number] NICOTINE 21 MG/24HR PT24 (NICOTINE) apply new patch to skin daily for 30 days, then decrease to 14 mg patch  #30 x 0   Entered and Authorized by:   Julieanne Manson MD   Signed by:   Julieanne Manson MD on 06/11/2010   Method used:   Electronically to        [facility] [address] [address] (retail)       [address]       [place], [place]  [address]       Ph: [phone]       Fax: [phone]   RxID:   [id number]    Orders Added: 1)  Capillary Blood Glucose/CBG [27782] 2)  Capillary Blood Glucose/CBG [82948] 3)  Capillary Blood Glucose/CBG [82948] 4)  Est. Patient Level IV [42353] 5)  Physical Therapy Referral [PT]

## 2010-06-22 NOTE — Letter (Signed)
Summary: Generic Letter  [facility]  [address] [place], [place] [address]   Phone: [phone]  Fax: [phone]    06/16/2010  [facility] [address] [place], [place]  [address]  Dear Mr. Hagarty,  We have been unable to contact you by telephone.  Please call our office, at your earliest convenience, so that we may speak with you.  Sincerely,   Dutch Quint RN

## 2010-06-22 NOTE — Progress Notes (Signed)
Summary: IMMEDIATE HOME HEALTH CARE IS NEEDED  Phone Note Call from Patient Call back at [phone]   Reason for Call: Referral Summary of Call: THE FAMILY IS REQUESTING IMMEDIATE HOME HEALTH CARE FROM [facility] AND THEY ARE FAXING OVER PAPERS TODAY TO BE FILLED OUT IMMEDIATELY.  MR Stiner POA IS LANORE BEDDIE AND SHE MAY BE CONTACTED AT PHON# ABOVE. Initial call taken by: Ayesha Rumpf,  June 08, 2010 9:55 AM  Follow-up for Phone Call        Sent to E. Whitaker Holderman.  Has appt. 06/11/10.  Dutch Quint RN  June 08, 2010 1:45 PM   Additional Follow-up for Phone Call Additional follow up Details #1::        I have not seen this paperwork--did we receive yet? Additional Follow-up by: Julieanne Manson MD,  June 09, 2010 3:10 PM    Additional Follow-up for Phone Call Additional follow up Details #2::    Looks like Aggie Cosier got the form.I don't know where she put it.  Arta Bruce,  June 09, 2010 4:24 PM  I never received forms.  Tried to call pt. -- neither number is currently working.  I called [facility] - [phone]- Harriett Sine stated that she already re-faxed.  The referral has to be sent to [facility] and a copy gets sent back to [facility].  ICD-9 code and medical diagnosis needs to be included. Form in outguide on your desk -- please return completed form to me.  Dutch Quint RN  June 15, 2010 2:26 PM  Theresa--I filled out the form--needs my NPI #--did not have with me at time of filling out.  Needs to be copied and faxed.  The stepdaughter can pick up Medicaid transportation voucher.  I thought it was something that needed filling out for future use, but was just for his visit the other day.  He has more attached vouchers, so important it gets back to him.  Julieanne Manson MD  June 15, 2010 10:30 PM   Attempted to call pt./stepdaughter.  Neither telephone # accepting calls/in service.  Holding transportation vouchers at my desk if they call back. Forms faxed to Home  [facility] and [facility].  Letter sent to pt.  Dutch Quint RN  June 16, 2010 2:26 PM

## 2010-07-02 ENCOUNTER — Encounter (INDEPENDENT_AMBULATORY_CARE_PROVIDER_SITE_OTHER): Payer: Self-pay | Admitting: Internal Medicine

## 2010-07-06 NOTE — Progress Notes (Signed)
Summary: BP low, weak -- go to ED?  Phone Note Other Incoming   Caller: Home Health Summary of Call: Benjamin Moss received call from Home Health Nurse-states BP 86/60 and pt. was weak.  Wanted to know if he should go to ER.  We were having trouble with phones, nurse had hung up before speaking with her.  Attempted to call pt. and his stepdaughter, pt.'s phone not in service, other phone did not answer. Initial call taken by: Dutch Quint RN,  June 22, 2010 3:34 PM  Follow-up for Phone Call        Any other symptoms than a low bp and weakness?  What was his pulse? What meds has he taken today? Please come find me with answers. Have them at least hold the HCTZ. Follow-up by: Julieanne Manson MD,  June 22, 2010 4:01 PM  Additional Follow-up for Phone Call Additional follow up Details #1::        Went to ED per echart records.  ED report on your desk in office.  Dutch Quint RN  June 23, 2010 5:32 PM  Normal exam, a bit prerenal.  Bolused fluids and apparently improved and discharged.  No changes to meds noted.   Pt. should follow up with me in next 2-4 weeks for this--please schedule (can wait for fasting labs to be done same day--should have that already scheduled--change to fall on same visit please.  Julieanne Manson MD  June 24, 2010 8:18 AM      Additional Follow-up for Phone Call Additional follow up Details #2::    Please schedule as requested by Dr. Delrae Alfred. Gaylyn Cheers RN  June 24, 2010 12:43 PM *Pt phone out of the area .Marland KitchenCheryll Dessert  June 25, 2010 3:52 PM  I call her sister nobody answer Can I send a letter to pt to call us back to schedule an appt .Marland KitchenCheryll Dessert  June 25, 2010 4:04 PM  Called [name], "not available".  Left message on voicemail of [name] to return my call.   Pt.'s phone not in service.  No lab appt. scheduled presently -- needs fasting labs for FLP, CBC, TSH and CMET.  Dutch Quint RN  June 25, 2010 4:33 PM  May also send letter with request for OV  and labs--do not need to see this back  Julieanne Manson MD  July 02, 2010 8:41 AM   Letter sent.  Dutch Quint RN  July 02, 2010 12:37 PM

## 2010-07-06 NOTE — Letter (Signed)
Summary: Generic Letter  [facility]  [address] [place], [place] [address]   Phone: [phone]  Fax: [phone]        07/02/2010  [facility] [address] [place], [place]  [address]  Dear Mr. Kray,  We have been unable to contact you by telephone regarding your recent call to this office.  Please call our office, at your earliest convenience, so that we may speak with you.  We need to schedule a follow-up office visit and fasting labs.  Sincerely,   Dutch Quint RN

## 2010-07-19 ENCOUNTER — Emergency Department (HOSPITAL_COMMUNITY)
Admission: EM | Admit: 2010-07-19 | Discharge: 2010-07-19 | Disposition: A | Discharge status: Home / Self Care | Payer: Medicaid Other | Attending: Emergency Medicine | Admitting: Emergency Medicine

## 2010-07-19 ENCOUNTER — Emergency Department (HOSPITAL_COMMUNITY): Payer: Medicaid Other

## 2010-07-19 DIAGNOSIS — I440 Atrioventricular block, first degree: Secondary | ICD-10-CM | POA: Insufficient documentation

## 2010-07-19 DIAGNOSIS — I10 Essential (primary) hypertension: Secondary | ICD-10-CM | POA: Insufficient documentation

## 2010-07-19 DIAGNOSIS — Z8679 Personal history of other diseases of the circulatory system: Secondary | ICD-10-CM | POA: Insufficient documentation

## 2010-07-19 DIAGNOSIS — R0609 Other forms of dyspnea: Secondary | ICD-10-CM | POA: Insufficient documentation

## 2010-07-19 DIAGNOSIS — R0989 Other specified symptoms and signs involving the circulatory and respiratory systems: Secondary | ICD-10-CM | POA: Insufficient documentation

## 2010-07-19 LAB — CBC
HCT: 39.4 % (ref 39.0–52.0)
Hemoglobin: 13.7 g/dL (ref 13.0–17.0)
MCH: 31.6 pg (ref 26.0–34.0)
MCHC: 34.8 g/dL (ref 30.0–36.0)
MCV: 91 fL (ref 78.0–100.0)

## 2010-07-19 LAB — BASIC METABOLIC PANEL
CO2: 26 mEq/L (ref 19–32)
Calcium: 9 mg/dL (ref 8.4–10.5)
Chloride: 106 mEq/L (ref 96–112)
GFR calc Af Amer: 50 mL/min — ABNORMAL LOW (ref >60)
Potassium: 3.7 mEq/L (ref 3.5–5.1)
Sodium: 140 mEq/L (ref 135–145)

## 2011-09-12 ENCOUNTER — Ambulatory Visit (HOSPITAL_COMMUNITY)
Admission: RE | Admit: 2011-09-12 | Discharge: 2011-09-12 | Disposition: A | Discharge status: Home / Self Care | Payer: Medicare Other | Source: Ambulatory Visit | Attending: Internal Medicine | Admitting: Internal Medicine

## 2011-09-12 ENCOUNTER — Other Ambulatory Visit: Payer: Self-pay | Admitting: Internal Medicine

## 2011-09-12 DIAGNOSIS — J449 Chronic obstructive pulmonary disease, unspecified: Secondary | ICD-10-CM | POA: Insufficient documentation

## 2011-09-12 DIAGNOSIS — R079 Chest pain, unspecified: Secondary | ICD-10-CM

## 2011-09-12 DIAGNOSIS — R05 Cough: Secondary | ICD-10-CM | POA: Insufficient documentation

## 2011-09-12 DIAGNOSIS — R059 Cough, unspecified: Secondary | ICD-10-CM | POA: Insufficient documentation

## 2011-09-12 DIAGNOSIS — J4489 Other specified chronic obstructive pulmonary disease: Secondary | ICD-10-CM | POA: Insufficient documentation

## 2011-11-11 ENCOUNTER — Other Ambulatory Visit (HOSPITAL_COMMUNITY): Payer: Self-pay | Admitting: Internal Medicine

## 2011-11-11 ENCOUNTER — Ambulatory Visit (HOSPITAL_COMMUNITY)
Admission: RE | Admit: 2011-11-11 | Discharge: 2011-11-11 | Disposition: A | Discharge status: Home / Self Care | Payer: Medicare Other | Source: Ambulatory Visit | Attending: Internal Medicine | Admitting: Internal Medicine

## 2011-11-11 DIAGNOSIS — R9389 Abnormal findings on diagnostic imaging of other specified body structures: Secondary | ICD-10-CM

## 2011-11-11 DIAGNOSIS — R918 Other nonspecific abnormal finding of lung field: Secondary | ICD-10-CM | POA: Insufficient documentation

## 2011-11-11 DIAGNOSIS — R05 Cough: Secondary | ICD-10-CM | POA: Insufficient documentation

## 2011-11-11 DIAGNOSIS — J438 Other emphysema: Secondary | ICD-10-CM | POA: Insufficient documentation

## 2011-11-11 DIAGNOSIS — R059 Cough, unspecified: Secondary | ICD-10-CM | POA: Insufficient documentation

## 2011-11-22 ENCOUNTER — Other Ambulatory Visit (HOSPITAL_COMMUNITY): Payer: Self-pay | Admitting: Internal Medicine

## 2011-11-22 DIAGNOSIS — J439 Emphysema, unspecified: Secondary | ICD-10-CM

## 2011-11-22 DIAGNOSIS — R9389 Abnormal findings on diagnostic imaging of other specified body structures: Secondary | ICD-10-CM

## 2011-11-30 ENCOUNTER — Inpatient Hospital Stay (HOSPITAL_COMMUNITY): Admission: RE | Admit: 2011-11-30 | Payer: Medicare Other | Source: Ambulatory Visit

## 2011-12-06 ENCOUNTER — Other Ambulatory Visit (HOSPITAL_COMMUNITY): Payer: Medicare Other

## 2011-12-14 ENCOUNTER — Ambulatory Visit (HOSPITAL_COMMUNITY)
Admission: RE | Admit: 2011-12-14 | Discharge: 2011-12-14 | Disposition: A | Discharge status: Home / Self Care | Payer: Medicare Other | Source: Ambulatory Visit | Attending: Internal Medicine | Admitting: Internal Medicine

## 2011-12-14 DIAGNOSIS — R9389 Abnormal findings on diagnostic imaging of other specified body structures: Secondary | ICD-10-CM

## 2011-12-14 DIAGNOSIS — R599 Enlarged lymph nodes, unspecified: Secondary | ICD-10-CM | POA: Insufficient documentation

## 2011-12-14 DIAGNOSIS — J438 Other emphysema: Secondary | ICD-10-CM | POA: Insufficient documentation

## 2011-12-14 DIAGNOSIS — J439 Emphysema, unspecified: Secondary | ICD-10-CM

## 2011-12-14 DIAGNOSIS — R911 Solitary pulmonary nodule: Secondary | ICD-10-CM | POA: Insufficient documentation

## 2011-12-14 LAB — CREATININE, SERUM
Creatinine, Ser: 1.18 mg/dL (ref 0.50–1.35)
GFR calc Af Amer: 73 mL/min — ABNORMAL LOW (ref >90)
GFR calc non Af Amer: 63 mL/min — ABNORMAL LOW (ref >90)

## 2011-12-14 MED ORDER — IOHEXOL 300 MG/ML  SOLN
80.0000 mL | Freq: Once | INTRAMUSCULAR | Status: AC | PRN
  Administered 2011-12-14: 80 mL via INTRAVENOUS

## 2011-12-29 ENCOUNTER — Other Ambulatory Visit (HOSPITAL_COMMUNITY): Payer: Self-pay | Admitting: Internal Medicine

## 2011-12-29 DIAGNOSIS — R918 Other nonspecific abnormal finding of lung field: Secondary | ICD-10-CM

## 2011-12-29 DIAGNOSIS — R9389 Abnormal findings on diagnostic imaging of other specified body structures: Secondary | ICD-10-CM

## 2012-01-05 ENCOUNTER — Encounter: Payer: Medicare Other | Admitting: Thoracic Surgery (Cardiothoracic Vascular Surgery)

## 2012-01-05 DIAGNOSIS — J449 Chronic obstructive pulmonary disease, unspecified: Secondary | ICD-10-CM | POA: Insufficient documentation

## 2012-01-05 DIAGNOSIS — I10 Essential (primary) hypertension: Secondary | ICD-10-CM | POA: Insufficient documentation

## 2012-01-05 DIAGNOSIS — I639 Cerebral infarction, unspecified: Secondary | ICD-10-CM | POA: Insufficient documentation

## 2012-01-05 DIAGNOSIS — R918 Other nonspecific abnormal finding of lung field: Secondary | ICD-10-CM | POA: Insufficient documentation

## 2012-01-05 DIAGNOSIS — E785 Hyperlipidemia, unspecified: Secondary | ICD-10-CM | POA: Insufficient documentation

## 2012-01-06 ENCOUNTER — Institutional Professional Consult (permissible substitution) (INDEPENDENT_AMBULATORY_CARE_PROVIDER_SITE_OTHER): Payer: Medicare Other | Admitting: Thoracic Surgery (Cardiothoracic Vascular Surgery)

## 2012-01-06 ENCOUNTER — Encounter: Payer: Self-pay | Admitting: Thoracic Surgery (Cardiothoracic Vascular Surgery)

## 2012-01-06 ENCOUNTER — Other Ambulatory Visit: Payer: Self-pay | Admitting: Thoracic Surgery (Cardiothoracic Vascular Surgery)

## 2012-01-06 ENCOUNTER — Ambulatory Visit
Admission: RE | Admit: 2012-01-06 | Discharge: 2012-01-06 | Disposition: A | Discharge status: Home / Self Care | Payer: Medicare Other | Source: Ambulatory Visit | Attending: Thoracic Surgery (Cardiothoracic Vascular Surgery) | Admitting: Thoracic Surgery (Cardiothoracic Vascular Surgery)

## 2012-01-06 ENCOUNTER — Other Ambulatory Visit: Payer: Self-pay | Admitting: *Deleted

## 2012-01-06 VITALS — BP 100/68 | HR 80 | Resp 18 | Ht 66.0 in | Wt 112.0 lb

## 2012-01-06 DIAGNOSIS — R222 Localized swelling, mass and lump, trunk: Secondary | ICD-10-CM

## 2012-01-06 DIAGNOSIS — R918 Other nonspecific abnormal finding of lung field: Secondary | ICD-10-CM

## 2012-01-06 DIAGNOSIS — R911 Solitary pulmonary nodule: Secondary | ICD-10-CM

## 2012-01-06 DIAGNOSIS — C7931 Secondary malignant neoplasm of brain: Secondary | ICD-10-CM

## 2012-01-06 DIAGNOSIS — J439 Emphysema, unspecified: Secondary | ICD-10-CM

## 2012-01-06 DIAGNOSIS — J438 Other emphysema: Secondary | ICD-10-CM

## 2012-01-06 DIAGNOSIS — D381 Neoplasm of uncertain behavior of trachea, bronchus and lung: Secondary | ICD-10-CM

## 2012-01-06 DIAGNOSIS — R51 Headache: Secondary | ICD-10-CM

## 2012-01-06 NOTE — Progress Notes (Signed)
PCP is Julieanne Manson, MD Referring Provider is Quitman Livings, MD  Chief Complaint  Patient presents with  . Lung Mass    Referral from Dr Roseanne Reno for surgical eval on lung mass,    HPI: Benjamin Moss presents for evaluation of a right lung mass.  He is here alone and is a very poor historian. He was being evaluated for a cough and shortness of breath this summer. Chest x-ray showed a right sided infiltrate. He was treated with antibiotics without relief. A repeat chest x-ray showed persistence of the right lung opacity and a CT scan was ordered. A CT on 12/14/2011 showed a 5 cm mass in the superior segment right lower lobe posteriorly adjacent to the vertebral column. There also was a 1 cm mass in the superior segment of the left lower lobe. There was some mild mediastinal adenopathy. Patient now is referred for surgical consideration.  He says these have this cough for several months. It has been productive of times. He does not have hemoptysis. He says he has had some chest pain with a cough that feels like someone "hitting me with a hammer in the chest." With further questioning he does not have exertional chest tightness or pressure as best I can tell. He does have wheezing "sometimes". He says his appetite is good but he is lost about 40 pounds since his stroke in 2012 appear. 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 He says that he walks with a cane or walker.   Past Medical History  Diagnosis Date  . Hyperlipidemia   . Hypertension   . COPD (chronic obstructive pulmonary disease)   . Stroke     left side weakness, ambulates with cane/ walker  . Right lower lobe lung mass   . Hypoxia   . Nodule of left lung     Past Surgical History  Procedure Date  .  Tonsillectomy     No family history on file. Patient is unaware of cause of death parents. He denies any history of heart disease or lung cancer in his family  Social History History  Substance Use Topics  . Smoking status: Current Every Day Smoker -- 2.0 packs/day for 50 years    Types: Cigarettes  . Smokeless tobacco: Never Used  . Alcohol Use: Yes    Current Outpatient Prescriptions  Medication Sig Dispense Refill  . amLODipine (NORVASC) 10 MG tablet Take 10 mg by mouth daily.       . hydrochlorothiazide (HYDRODIURIL) 25 MG tablet Take 25 mg by mouth daily.       Marland Kitchen HYDROcodone-acetaminophen (NORCO) 10-325 MG per tablet every 6 (six) hours as needed.       Marland Kitchen lisinopril (PRINIVIL,ZESTRIL) 10 MG tablet Take 10 mg by mouth daily.       . pravastatin (PRAVACHOL) 80 MG tablet Take 80 mg by mouth daily.       Marland Kitchen albuterol (PROVENTIL HFA;VENTOLIN HFA) 108 (90 BASE) MCG/ACT inhaler Inhale 2 puffs into the lungs every 6 (six) hours as needed.      . clopidogrel (PLAVIX) 75 MG tablet Take 75 mg by mouth daily.         No Known Allergies  Review of Systems  Constitutional: Positive for unexpected weight change (lost 40  pounds since stroke in 2011).  Respiratory: Positive for cough (productive, no hemoptysis), shortness of breath (with exertion) and wheezing.   Cardiovascular: Negative for palpitations and leg swelling. Chest pain: "like being hit with a hammer in the chest"  Genitourinary: Positive for dysuria and urgency. Negative for discharge.  Neurological: Positive for weakness (left sided since stroke in 2011).       Walks with cane/ walker  All other systems reviewed and are negative.    BP 100/68  Pulse 80  Resp 18  Ht 5\' 6"  (1.676 m)  Wt 112 lb (50.803 kg)  BMI 18.08 kg/m2  SpO2 94% Physical Exam  Vitals reviewed. Constitutional: He is oriented to person, place, and time.       Thin 66 yo male in NAD, appears undernourished with temporal and thenar wasting  HENT:    Head: Normocephalic and atraumatic.  Eyes: EOM are normal. Pupils are equal, round, and reactive to light.  Neck: Neck supple. No thyromegaly present.  Cardiovascular: Normal rate, regular rhythm and normal heart sounds.   No murmur heard. Pulmonary/Chest:       Diminished BS bilaterally  Abdominal: Soft. There is no tenderness.  Lymphadenopathy:    He has no cervical adenopathy.  Neurological: He is alert and oriented to person, place, and time. Coordination (walks with limp, lifting left leg) abnormal.       5/5 on right, 4/5 LUE and LLE  Skin: Skin is warm and dry.     Diagnostic Tests: CXR 11/11/11 Clinical Data: Productive cough for 2 months. Abnormal chest x-  ray.  CHEST - 2 VIEW  Comparison: Two-view chest 09/12/2011.  Findings: The heart size is normal. Ill-defined superior segment  right lower lobe airspace disease is again noted. There is slight  improvement since the prior study. Emphysematous changes are seen.  No other focal airspace disease is evident.  IMPRESSION:  1. Persistent medial superior segment right lower lobe airspace  opacification. Although there is some improvement, this has not  resolved over 2 months. CT of the chest with contrast is  recommended for further evaluation.  2. Emphysema.   CT Chest 12/14/11 Clinical Data: Emphysema. Abnormal chest radiograph.  CT CHEST WITH CONTRAST  Technique: Multidetector CT imaging of the chest was performed  following the standard protocol during bolus administration of  intravenous contrast.  Contrast: 80mL OMNIPAQUE IOHEXOL 300 MG/ML SOLN  Comparison: Chest radiograph 11/11/2011.  Findings: Right paratracheal lymph node measures 12 mm (image 17).  There is right hilar lymphoid tissue without discretely enlarged  lymph node. No left hilar or axillary adenopathy. Atherosclerotic  calcification of the arterial vasculature, including coronary  arteries. Heart size normal. No pericardial effusion.  A  spiculated mass in the superior segment right lower lobe measures  3.9 x 3.8 cm. It retracts the adjacent major fissure. Emphysema.  An irregular nodular density in the superior segment left lower  lobe measures 7 x 9 mm (image 25). No pleural fluid. Airway is  unremarkable.  Incidental imaging of the upper abdomen shows calcification along  the splenic capsule. No worrisome lytic or sclerotic lesions.  Degenerative changes are seen in the spine.  IMPRESSION:  1. Spiculated right lower lobe mass is most consistent with  primary bronchogenic carcinoma. Consultation with the [facility] [phone]) should be  considered. These results will be called to the ordering clinician  or representative by the Radiologist Assistant, and communication  documented in the PACS Dashboard.  2.  Irregular left lower lobe nodule. Synchronous bronchogenic  carcinoma could have this appearance.  3. Minimally enlarged right paratracheal lymph node. Right hilar  lymphoid tissue without discrete lymph node.   Impression: 66 year old gentleman with ongoing heavy tobacco abuse and COPD who has been found to have a 5 cm right lung mass, which almost surely is a non-small cell lung carcinoma. He also has about a 1 cm mass in his superior segment of his left lower lobe, which is suspicious for a synchronous lesion. He has some mildly enlarged mediastinal nodes. I discussed as well as I could with Mr. Kandler these findings. He does seem to understand that this almost certainly is lung cancer, it needs to be treated as such. I discussed with him the diagnostic and staging workup.  I recommended to him that we proceed with clinical staging and biopsy. He will need a PET/CT as well as an MRI of the brain to assess the extent of disease. He also needs pulmonary function testing room air blood gas to assess his pulmonary reserve. Think the best approach in terms of diagnosis would be to  proceed with electromagnetic navigational bronchoscopy to biopsy both lower lobe masses, at the same setting we would also do an EBUS to assess the mediastinal nodes. I discussed in detail with him the operative procedure. He understands that this would be done in the operating room under general anesthesia, we would do this on an outpatient basis. He understands that this is not guaranteed to be definitively diagnostic. We did discuss the risk which include complications related to anesthesia such as stroke or MI, bleeding, pneumothorax, and inability to establish a diagnosis. He accepts these risks and wishes to proceed.  Plan: Needs: 1. PET/ CT- for clinical staging  2. PFTs with RA ABG to determine pulmonary reserve  3. MR Brain- clinical staging to r/o brain mets.  4. ENB to biopsy right and left LL nodules/ EBUS to sample mediastinal lymph nodes- tentatively scheduled for Monday 10/14

## 2012-01-06 NOTE — Patient Instructions (Signed)
Stop taking clopidigrel (plavix) after October 9th. You need to be off of it for 5 days prior to your biopsy  Continue to take your other medications

## 2012-01-09 ENCOUNTER — Ambulatory Visit (HOSPITAL_COMMUNITY)
Admission: RE | Admit: 2012-01-09 | Discharge: 2012-01-09 | Disposition: A | Discharge status: Home / Self Care | Payer: Medicare Other | Source: Ambulatory Visit | Attending: Thoracic Surgery (Cardiothoracic Vascular Surgery) | Admitting: Thoracic Surgery (Cardiothoracic Vascular Surgery)

## 2012-01-09 ENCOUNTER — Emergency Department (HOSPITAL_COMMUNITY)
Admission: RE | Admit: 2012-01-09 | Discharge: 2012-01-09 | Disposition: A | Discharge status: Home / Self Care | Payer: Medicare Other | Source: Ambulatory Visit | Attending: Thoracic Surgery (Cardiothoracic Vascular Surgery) | Admitting: Thoracic Surgery (Cardiothoracic Vascular Surgery)

## 2012-01-09 ENCOUNTER — Other Ambulatory Visit: Payer: Self-pay | Admitting: Thoracic Surgery (Cardiothoracic Vascular Surgery)

## 2012-01-09 ENCOUNTER — Encounter (HOSPITAL_COMMUNITY)
Admission: RE | Admit: 2012-01-09 | Discharge: 2012-01-09 | Disposition: A | Discharge status: Home / Self Care | Payer: Medicare Other | Source: Ambulatory Visit | Attending: Internal Medicine | Admitting: Internal Medicine

## 2012-01-09 ENCOUNTER — Emergency Department (HOSPITAL_COMMUNITY)
Admission: EM | Admit: 2012-01-09 | Discharge: 2012-01-09 | Discharge status: Against Medical Advice | Payer: Medicare Other | Attending: Psychiatry | Admitting: Psychiatry

## 2012-01-09 ENCOUNTER — Ambulatory Visit (HOSPITAL_COMMUNITY): Admission: RE | Admit: 2012-01-09 | Payer: Medicare Other | Source: Ambulatory Visit

## 2012-01-09 DIAGNOSIS — R51 Headache: Secondary | ICD-10-CM

## 2012-01-09 DIAGNOSIS — R9389 Abnormal findings on diagnostic imaging of other specified body structures: Secondary | ICD-10-CM

## 2012-01-09 DIAGNOSIS — D381 Neoplasm of uncertain behavior of trachea, bronchus and lung: Secondary | ICD-10-CM

## 2012-01-09 DIAGNOSIS — C7931 Secondary malignant neoplasm of brain: Secondary | ICD-10-CM

## 2012-01-09 DIAGNOSIS — C3430 Malignant neoplasm of lower lobe, unspecified bronchus or lung: Secondary | ICD-10-CM | POA: Insufficient documentation

## 2012-01-09 DIAGNOSIS — R599 Enlarged lymph nodes, unspecified: Secondary | ICD-10-CM | POA: Insufficient documentation

## 2012-01-09 DIAGNOSIS — C7949 Secondary malignant neoplasm of other parts of nervous system: Secondary | ICD-10-CM | POA: Insufficient documentation

## 2012-01-09 DIAGNOSIS — R222 Localized swelling, mass and lump, trunk: Secondary | ICD-10-CM | POA: Insufficient documentation

## 2012-01-09 DIAGNOSIS — R918 Other nonspecific abnormal finding of lung field: Secondary | ICD-10-CM

## 2012-01-09 DIAGNOSIS — Z8673 Personal history of transient ischemic attack (TIA), and cerebral infarction without residual deficits: Secondary | ICD-10-CM | POA: Insufficient documentation

## 2012-01-09 MED ORDER — GADOBENATE DIMEGLUMINE 529 MG/ML IV SOLN
10.0000 mL | Freq: Once | INTRAVENOUS | Status: AC | PRN
  Administered 2012-01-09: 10 mL via INTRAVENOUS

## 2012-01-09 MED ORDER — FLUDEOXYGLUCOSE F - 18 (FDG) INJECTION
19.1000 | Freq: Once | INTRAVENOUS | Status: AC | PRN
  Administered 2012-01-09: 19.1 via INTRAVENOUS

## 2012-01-10 ENCOUNTER — Ambulatory Visit (HOSPITAL_COMMUNITY)
Admission: RE | Admit: 2012-01-10 | Discharge: 2012-01-10 | Disposition: A | Discharge status: Home / Self Care | Payer: Medicare Other | Source: Ambulatory Visit | Attending: Thoracic Surgery (Cardiothoracic Vascular Surgery) | Admitting: Thoracic Surgery (Cardiothoracic Vascular Surgery)

## 2012-01-10 DIAGNOSIS — F17200 Nicotine dependence, unspecified, uncomplicated: Secondary | ICD-10-CM | POA: Insufficient documentation

## 2012-01-10 DIAGNOSIS — J988 Other specified respiratory disorders: Secondary | ICD-10-CM | POA: Insufficient documentation

## 2012-01-10 DIAGNOSIS — R0609 Other forms of dyspnea: Secondary | ICD-10-CM | POA: Insufficient documentation

## 2012-01-10 DIAGNOSIS — R0989 Other specified symptoms and signs involving the circulatory and respiratory systems: Secondary | ICD-10-CM | POA: Insufficient documentation

## 2012-01-10 LAB — BLOOD GAS, ARTERIAL
Bicarbonate: 23.5 mEq/L (ref 20.0–24.0)
FIO2: 0.21 %
Patient temperature: 98.6
pCO2 arterial: 36.3 mmHg (ref 35.0–45.0)
pH, Arterial: 7.426 (ref 7.350–7.450)
pO2, Arterial: 54.1 mmHg — ABNORMAL LOW (ref 80.0–100.0)

## 2012-01-10 LAB — PULMONARY FUNCTION TEST

## 2012-01-10 MED ORDER — ALBUTEROL SULFATE (5 MG/ML) 0.5% IN NEBU
2.5000 mg | INHALATION_SOLUTION | Freq: Once | RESPIRATORY_TRACT | Status: AC
  Administered 2012-01-10: 2.5 mg via RESPIRATORY_TRACT

## 2012-01-13 ENCOUNTER — Encounter (HOSPITAL_COMMUNITY): Payer: Self-pay | Admitting: *Deleted

## 2012-01-13 NOTE — Progress Notes (Signed)
Pt states he is taking cab here, doesn't have anyone to stay with him after procedure. Notified Dr. Sunday Corn office.

## 2012-01-16 ENCOUNTER — Encounter (HOSPITAL_COMMUNITY)
Admission: RE | Disposition: A | Discharge status: Home / Self Care | Payer: Self-pay | Source: Ambulatory Visit | Attending: Thoracic Surgery (Cardiothoracic Vascular Surgery)

## 2012-01-16 ENCOUNTER — Ambulatory Visit (HOSPITAL_COMMUNITY): Payer: Medicare Other

## 2012-01-16 ENCOUNTER — Encounter (HOSPITAL_COMMUNITY): Payer: Self-pay | Admitting: Certified Registered Nurse Anesthetist

## 2012-01-16 ENCOUNTER — Ambulatory Visit (HOSPITAL_COMMUNITY)
Admission: RE | Admit: 2012-01-16 | Discharge: 2012-01-17 | Disposition: A | Discharge status: Home / Self Care | Payer: Medicare Other | Source: Ambulatory Visit | Attending: Thoracic Surgery (Cardiothoracic Vascular Surgery) | Admitting: Thoracic Surgery (Cardiothoracic Vascular Surgery)

## 2012-01-16 ENCOUNTER — Encounter (HOSPITAL_COMMUNITY): Payer: Self-pay | Admitting: *Deleted

## 2012-01-16 ENCOUNTER — Ambulatory Visit (HOSPITAL_COMMUNITY): Payer: Medicare Other | Admitting: Certified Registered Nurse Anesthetist

## 2012-01-16 DIAGNOSIS — R599 Enlarged lymph nodes, unspecified: Secondary | ICD-10-CM | POA: Insufficient documentation

## 2012-01-16 DIAGNOSIS — J449 Chronic obstructive pulmonary disease, unspecified: Secondary | ICD-10-CM | POA: Insufficient documentation

## 2012-01-16 DIAGNOSIS — R918 Other nonspecific abnormal finding of lung field: Secondary | ICD-10-CM

## 2012-01-16 DIAGNOSIS — D381 Neoplasm of uncertain behavior of trachea, bronchus and lung: Secondary | ICD-10-CM

## 2012-01-16 DIAGNOSIS — Z23 Encounter for immunization: Secondary | ICD-10-CM | POA: Insufficient documentation

## 2012-01-16 DIAGNOSIS — R29898 Other symptoms and signs involving the musculoskeletal system: Secondary | ICD-10-CM | POA: Insufficient documentation

## 2012-01-16 DIAGNOSIS — I69998 Other sequelae following unspecified cerebrovascular disease: Secondary | ICD-10-CM | POA: Insufficient documentation

## 2012-01-16 DIAGNOSIS — E785 Hyperlipidemia, unspecified: Secondary | ICD-10-CM | POA: Insufficient documentation

## 2012-01-16 DIAGNOSIS — C3430 Malignant neoplasm of lower lobe, unspecified bronchus or lung: Secondary | ICD-10-CM | POA: Insufficient documentation

## 2012-01-16 DIAGNOSIS — F17200 Nicotine dependence, unspecified, uncomplicated: Secondary | ICD-10-CM | POA: Insufficient documentation

## 2012-01-16 DIAGNOSIS — J4489 Other specified chronic obstructive pulmonary disease: Secondary | ICD-10-CM | POA: Insufficient documentation

## 2012-01-16 DIAGNOSIS — I10 Essential (primary) hypertension: Secondary | ICD-10-CM | POA: Insufficient documentation

## 2012-01-16 LAB — SURGICAL PCR SCREEN: Staphylococcus aureus: NEGATIVE

## 2012-01-16 LAB — CBC
HCT: 43.6 % (ref 39.0–52.0)
Hemoglobin: 15.5 g/dL (ref 13.0–17.0)
MCHC: 35.6 g/dL (ref 30.0–36.0)
RBC: 4.76 MIL/uL (ref 4.22–5.81)
WBC: 5.4 10*3/uL (ref 4.0–10.5)

## 2012-01-16 LAB — COMPREHENSIVE METABOLIC PANEL
ALT: 9 U/L (ref 0–53)
Alkaline Phosphatase: 94 U/L (ref 39–117)
BUN: 29 mg/dL — ABNORMAL HIGH (ref 6–23)
Chloride: 102 mEq/L (ref 96–112)
GFR calc Af Amer: 77 mL/min — ABNORMAL LOW (ref >90)
Glucose, Bld: 105 mg/dL — ABNORMAL HIGH (ref 70–99)
Potassium: 3.5 mEq/L (ref 3.5–5.1)
Sodium: 139 mEq/L (ref 135–145)
Total Bilirubin: 0.3 mg/dL (ref 0.3–1.2)
Total Protein: 8 g/dL (ref 6.0–8.3)

## 2012-01-16 LAB — PROTIME-INR: Prothrombin Time: 12.8 seconds (ref 11.6–15.2)

## 2012-01-16 LAB — APTT: aPTT: 30 seconds (ref 24–37)

## 2012-01-16 SURGERY — VIDEO BRONCHOSCOPY WITH ENDOBRONCHIAL NAVIGATION
Anesthesia: General | Site: Chest | Wound class: Clean Contaminated

## 2012-01-16 MED ORDER — LACTATED RINGERS IV SOLN
INTRAVENOUS | Status: DC | PRN
  Administered 2012-01-16 (×2): via INTRAVENOUS

## 2012-01-16 MED ORDER — PHENYLEPHRINE HCL 10 MG/ML IJ SOLN
INTRAMUSCULAR | Status: DC | PRN
  Administered 2012-01-16 (×2): 80 ug via INTRAVENOUS
  Administered 2012-01-16 (×3): 40 ug via INTRAVENOUS
  Administered 2012-01-16 (×3): 80 ug via INTRAVENOUS
  Administered 2012-01-16: 40 ug via INTRAVENOUS
  Administered 2012-01-16 (×3): 80 ug via INTRAVENOUS
  Administered 2012-01-16: 40 ug via INTRAVENOUS
  Administered 2012-01-16: 80 ug via INTRAVENOUS
  Administered 2012-01-16: 40 ug via INTRAVENOUS

## 2012-01-16 MED ORDER — LIDOCAINE HCL (CARDIAC) 20 MG/ML IV SOLN
INTRAVENOUS | Status: DC | PRN
  Administered 2012-01-16: 70 mg via INTRAVENOUS

## 2012-01-16 MED ORDER — SIMVASTATIN 40 MG PO TABS
40.0000 mg | ORAL_TABLET | Freq: Every day | ORAL | Status: DC
  Filled 2012-01-16: qty 1

## 2012-01-16 MED ORDER — ACETAMINOPHEN 160 MG/5ML PO SOLN
650.0000 mg | Freq: Four times a day (QID) | ORAL | Status: DC | PRN
  Administered 2012-01-16 – 2012-01-17 (×2): 650 mg via ORAL
  Filled 2012-01-16: qty 20.3

## 2012-01-16 MED ORDER — ONDANSETRON HCL 4 MG/2ML IJ SOLN
INTRAMUSCULAR | Status: DC | PRN
  Administered 2012-01-16: 4 mg via INTRAVENOUS

## 2012-01-16 MED ORDER — ROCURONIUM BROMIDE 100 MG/10ML IV SOLN
INTRAVENOUS | Status: DC | PRN
  Administered 2012-01-16: 5 mg via INTRAVENOUS
  Administered 2012-01-16: 40 mg via INTRAVENOUS

## 2012-01-16 MED ORDER — ALBUTEROL SULFATE HFA 108 (90 BASE) MCG/ACT IN AERS
1.0000 | INHALATION_SPRAY | Freq: Four times a day (QID) | RESPIRATORY_TRACT | Status: DC | PRN
  Filled 2012-01-16: qty 6.7

## 2012-01-16 MED ORDER — DEXTROSE IN LACTATED RINGERS 5 % IV SOLN
INTRAVENOUS | Status: DC
  Administered 2012-01-16 – 2012-01-17 (×2): via INTRAVENOUS

## 2012-01-16 MED ORDER — AMLODIPINE BESYLATE 10 MG PO TABS
10.0000 mg | ORAL_TABLET | Freq: Every day | ORAL | Status: DC
  Administered 2012-01-17: 10 mg via ORAL
  Filled 2012-01-16 (×2): qty 1

## 2012-01-16 MED ORDER — FENTANYL CITRATE 0.05 MG/ML IJ SOLN
INTRAMUSCULAR | Status: DC | PRN
  Administered 2012-01-16: 75 ug via INTRAVENOUS

## 2012-01-16 MED ORDER — HYDROMORPHONE HCL PF 1 MG/ML IJ SOLN: 0.2500 mg | INTRAMUSCULAR | Status: DC | PRN

## 2012-01-16 MED ORDER — EPINEPHRINE HCL 1 MG/ML IJ SOLN
INTRAMUSCULAR | Status: AC
  Filled 2012-01-16: qty 2

## 2012-01-16 MED ORDER — PNEUMOCOCCAL VAC POLYVALENT 25 MCG/0.5ML IJ INJ
0.5000 mL | INJECTION | INTRAMUSCULAR | Status: AC
  Administered 2012-01-17: 0.5 mL via INTRAMUSCULAR
  Filled 2012-01-16: qty 0.5

## 2012-01-16 MED ORDER — ONDANSETRON HCL 4 MG/2ML IJ SOLN: 4.0000 mg | Freq: Three times a day (TID) | INTRAMUSCULAR | Status: DC | PRN

## 2012-01-16 MED ORDER — INFLUENZA VIRUS VACC SPLIT PF IM SUSP
0.5000 mL | INTRAMUSCULAR | Status: AC
  Administered 2012-01-17: 0.5 mL via INTRAMUSCULAR
  Filled 2012-01-16: qty 0.5

## 2012-01-16 MED ORDER — EPHEDRINE SULFATE 50 MG/ML IJ SOLN
INTRAMUSCULAR | Status: DC | PRN
  Administered 2012-01-16: 10 mg via INTRAVENOUS
  Administered 2012-01-16: 20 mg via INTRAVENOUS
  Administered 2012-01-16 (×11): 10 mg via INTRAVENOUS

## 2012-01-16 MED ORDER — PHENYLEPHRINE HCL 10 MG/ML IJ SOLN
10.0000 mg | INTRAVENOUS | Status: DC | PRN
  Administered 2012-01-16: 20 ug/min via INTRAVENOUS

## 2012-01-16 MED ORDER — GLYCOPYRROLATE 0.2 MG/ML IJ SOLN
INTRAMUSCULAR | Status: DC | PRN
  Administered 2012-01-16: .6 mg via INTRAVENOUS

## 2012-01-16 MED ORDER — ALBUMIN HUMAN 5 % IV SOLN
INTRAVENOUS | Status: DC | PRN
  Administered 2012-01-16: 10:00:00 via INTRAVENOUS

## 2012-01-16 MED ORDER — HYDROCODONE-ACETAMINOPHEN 10-325 MG PO TABS
1.0000 | ORAL_TABLET | Freq: Four times a day (QID) | ORAL | Status: DC | PRN
  Administered 2012-01-17: 1 via ORAL
  Filled 2012-01-16: qty 1

## 2012-01-16 MED ORDER — CLOPIDOGREL BISULFATE 75 MG PO TABS
75.0000 mg | ORAL_TABLET | Freq: Every day | ORAL | Status: DC
  Administered 2012-01-17: 75 mg via ORAL
  Filled 2012-01-16 (×2): qty 1

## 2012-01-16 MED ORDER — MIDAZOLAM HCL 5 MG/5ML IJ SOLN
INTRAMUSCULAR | Status: DC | PRN
  Administered 2012-01-16: 2 mg via INTRAVENOUS

## 2012-01-16 MED ORDER — HYDROCHLOROTHIAZIDE 25 MG PO TABS
25.0000 mg | ORAL_TABLET | Freq: Every day | ORAL | Status: DC
  Administered 2012-01-17: 25 mg via ORAL
  Filled 2012-01-16 (×2): qty 1

## 2012-01-16 MED ORDER — LISINOPRIL 10 MG PO TABS
10.0000 mg | ORAL_TABLET | Freq: Every day | ORAL | Status: DC
  Administered 2012-01-17: 10 mg via ORAL
  Filled 2012-01-16 (×2): qty 1

## 2012-01-16 MED ORDER — SODIUM CHLORIDE 0.9 % IR SOLN
Status: DC | PRN
  Administered 2012-01-16: 1000 mL

## 2012-01-16 MED ORDER — PRAVASTATIN SODIUM 80 MG PO TABS
80.0000 mg | ORAL_TABLET | Freq: Every day | ORAL | Status: DC
  Administered 2012-01-16: 80 mg via ORAL
  Filled 2012-01-16 (×2): qty 1

## 2012-01-16 MED ORDER — ARTIFICIAL TEARS OP OINT
TOPICAL_OINTMENT | OPHTHALMIC | Status: DC | PRN
  Administered 2012-01-16: 1 via OPHTHALMIC

## 2012-01-16 MED ORDER — NEOSTIGMINE METHYLSULFATE 1 MG/ML IJ SOLN
INTRAMUSCULAR | Status: DC | PRN
  Administered 2012-01-16: 4 mg via INTRAVENOUS

## 2012-01-16 MED ORDER — MUPIROCIN 2 % EX OINT
TOPICAL_OINTMENT | CUTANEOUS | Status: AC
  Filled 2012-01-16: qty 22

## 2012-01-16 MED ORDER — GI COCKTAIL ~~LOC~~
30.0000 mL | Freq: Three times a day (TID) | ORAL | Status: DC | PRN
  Filled 2012-01-16: qty 30

## 2012-01-16 MED ORDER — MAGNESIUM HYDROXIDE 400 MG/5ML PO SUSP: 30.0000 mL | Freq: Every day | ORAL | Status: DC | PRN

## 2012-01-16 MED ORDER — PANTOPRAZOLE SODIUM 40 MG PO TBEC
40.0000 mg | DELAYED_RELEASE_TABLET | Freq: Every day | ORAL | Status: DC
  Administered 2012-01-16: 40 mg via ORAL
  Filled 2012-01-16: qty 1

## 2012-01-16 MED ORDER — PROPOFOL 10 MG/ML IV BOLUS
INTRAVENOUS | Status: DC | PRN
  Administered 2012-01-16: 20 mg via INTRAVENOUS
  Administered 2012-01-16: 30 mg via INTRAVENOUS
  Administered 2012-01-16: 50 mg via INTRAVENOUS
  Administered 2012-01-16 (×4): 30 mg via INTRAVENOUS
  Administered 2012-01-16: 120 mg via INTRAVENOUS
  Administered 2012-01-16 (×2): 30 mg via INTRAVENOUS

## 2012-01-16 SURGICAL SUPPLY — 47 items
BALL CTTN LRG ABS STRL LF (GAUZE/BANDAGES/DRESSINGS)
BRUSH CYTOL CELLEBRITY 1.5X140 (MISCELLANEOUS) IMPLANT
BRUSH SUPERTRAX BIOPSY (INSTRUMENTS) IMPLANT
BRUSH SUPERTRAX NDL-TIP CYTO (INSTRUMENTS) ×4 IMPLANT
CANISTER SUCTION 2500CC (MISCELLANEOUS) ×2 IMPLANT
CHANNEL WORK EXTEND EDGE 180 (KITS) IMPLANT
CHANNEL WORK EXTEND EDGE 45 (KITS) IMPLANT
CHANNEL WORK EXTEND EDGE 90 (KITS) IMPLANT
CLOTH BEACON ORANGE TIMEOUT ST (SAFETY) ×2 IMPLANT
CONT SPEC 4OZ CLIKSEAL STRL BL (MISCELLANEOUS) ×10 IMPLANT
COTTONBALL LRG STERILE PKG (GAUZE/BANDAGES/DRESSINGS) IMPLANT
COVER TABLE BACK 60X90 (DRAPES) ×2 IMPLANT
FILTER STRAW FLUID ASPIR (MISCELLANEOUS) IMPLANT
FORCEPS BIOP RJ4 1.8 (CUTTING FORCEPS) IMPLANT
FORCEPS BIOP SUPERTRX PREMAR (INSTRUMENTS) ×2 IMPLANT
GLOVE BIOGEL PI IND STRL 6.5 (GLOVE) ×1 IMPLANT
GLOVE BIOGEL PI INDICATOR 6.5 (GLOVE) ×1
GLOVE SURG SIGNA 7.5 PF LTX (GLOVE) ×2 IMPLANT
GOWN BRE IMP SLV AUR LG STRL (GOWN DISPOSABLE) ×2 IMPLANT
GOWN PREVENTION PLUS XLARGE (GOWN DISPOSABLE) IMPLANT
KIT LOCATABLE GUIDE (CANNULA) IMPLANT
KIT MARKER FIDUCIAL DELIVERY (KITS) IMPLANT
KIT PROCEDURE EDGE 180 (KITS) ×2 IMPLANT
KIT PROCEDURE EDGE 45 (KITS) IMPLANT
KIT PROCEDURE EDGE 90 (KITS) ×2 IMPLANT
KIT ROOM TURNOVER OR (KITS) ×2 IMPLANT
MARKER SKIN DUAL TIP RULER LAB (MISCELLANEOUS) ×2 IMPLANT
NEEDLE 22X1 1/2 (OR ONLY) (NEEDLE) IMPLANT
NEEDLE BIOPSY TRANSBRONCH 21G (NEEDLE) IMPLANT
NEEDLE BLUNT 18X1 FOR OR ONLY (NEEDLE) IMPLANT
NEEDLE SUPERTRX PREMARK BIOPSY (NEEDLE) ×2 IMPLANT
NEEDLE SYS SONOTIP II EBUSTBNA (NEEDLE) IMPLANT
NS IRRIG 1000ML POUR BTL (IV SOLUTION) ×2 IMPLANT
OIL SILICONE PENTAX (PARTS (SERVICE/REPAIRS)) ×2 IMPLANT
PAD ARMBOARD 7.5X6 YLW CONV (MISCELLANEOUS) ×4 IMPLANT
PATCHES PATIENT (LABEL) ×2 IMPLANT
SPONGE GAUZE 4X4 12PLY (GAUZE/BANDAGES/DRESSINGS) ×2 IMPLANT
SYR 20ML ECCENTRIC (SYRINGE) ×4 IMPLANT
SYR 30ML LL (SYRINGE) IMPLANT
SYR 5ML LUER SLIP (SYRINGE) IMPLANT
SYR CONTROL 10ML LL (SYRINGE) IMPLANT
TOWEL OR 17X24 6PK STRL BLUE (TOWEL DISPOSABLE) ×2 IMPLANT
TRAP SPECIMEN MUCOUS 40CC (MISCELLANEOUS) ×2 IMPLANT
TUBE CONNECTING 12X1/4 (SUCTIONS) ×2 IMPLANT
VALVE BIOPSY  SINGLE USE (MISCELLANEOUS)
VALVE BIOPSY SINGLE USE (MISCELLANEOUS) IMPLANT
VALVE SUCTION BRONCHIO DISP (MISCELLANEOUS) IMPLANT

## 2012-01-16 NOTE — Anesthesia Preprocedure Evaluation (Addendum)
Anesthesia Evaluation  Patient identified by MRN, date of birth, ID band Patient awake    Reviewed: Allergy & Precautions, H&P , NPO status , Patient's Chart, lab work & pertinent test results  Airway Mallampati: II      Dental   Pulmonary COPD breath sounds clear to auscultation        Cardiovascular hypertension, Rhythm:Regular Rate:Normal     Neuro/Psych CVA    GI/Hepatic negative GI ROS, Neg liver ROS,   Endo/Other  negative endocrine ROS  Renal/GU negative Renal ROS     Musculoskeletal   Abdominal   Peds  Hematology   Anesthesia Other Findings   Reproductive/Obstetrics                          Anesthesia Physical Anesthesia Plan  ASA: III  Anesthesia Plan: General   Post-op Pain Management:    Induction: Intravenous  Airway Management Planned:   Additional Equipment:   Intra-op Plan:   Post-operative Plan: Possible Post-op intubation/ventilation  Informed Consent:   Plan Discussed with: CRNA, Anesthesiologist and Surgeon  Anesthesia Plan Comments:         Anesthesia Quick Evaluation

## 2012-01-16 NOTE — H&P (View-Only) (Signed)
PCP is MULBERRY,ELIZABETH, MD Referring Provider is Hassan, Sami, MD  Chief Complaint  Patient presents with  . Lung Mass    Referral from Dr Hassan for surgical eval on lung mass,    HPI: Benjamin Moss presents for evaluation of a right lung mass.  He is here alone and is a very poor historian. He was being evaluated for a cough and shortness of breath this summer. Chest x-ray showed a right sided infiltrate. He was treated with antibiotics without relief. A repeat chest x-ray showed persistence of the right lung opacity and a CT scan was ordered. A CT on 12/14/2011 showed a 5 cm mass in the superior segment right lower lobe posteriorly adjacent to the vertebral column. There also was a 1 cm mass in the superior segment of the left lower lobe. There was some mild mediastinal adenopathy. Patient now is referred for surgical consideration.  He says these have this cough for several months. It has been productive of times. He does not have hemoptysis. He says he has had some chest pain with a cough that feels like someone "hitting me with a hammer in the chest." With further questioning he does not have exertional chest tightness or pressure as best I can tell. He does have wheezing "sometimes". He says his appetite is good but he is lost about 40 pounds since his stroke in 2012 appear. 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 He says that he walks with a cane or walker.   Past Medical History  Diagnosis Date  . Hyperlipidemia   . Hypertension   . COPD (chronic obstructive pulmonary disease)   . Stroke     left side weakness, ambulates with cane/ walker  . Right lower lobe lung mass   . Hypoxia   . Nodule of left lung     Past Surgical History  Procedure Date  .  Tonsillectomy     No family history on file. Patient is unaware of cause of death parents. He denies any history of heart disease or lung cancer in his family  Social History History  Substance Use Topics  . Smoking status: Current Every Day Smoker -- 2.0 packs/day for 50 years    Types: Cigarettes  . Smokeless tobacco: Never Used  . Alcohol Use: Yes    Current Outpatient Prescriptions  Medication Sig Dispense Refill  . amLODipine (NORVASC) 10 MG tablet Take 10 mg by mouth daily.       . hydrochlorothiazide (HYDRODIURIL) 25 MG tablet Take 25 mg by mouth daily.       . HYDROcodone-acetaminophen (NORCO) 10-325 MG per tablet every 6 (six) hours as needed.       . lisinopril (PRINIVIL,ZESTRIL) 10 MG tablet Take 10 mg by mouth daily.       . pravastatin (PRAVACHOL) 80 MG tablet Take 80 mg by mouth daily.       . albuterol (PROVENTIL HFA;VENTOLIN HFA) 108 (90 BASE) MCG/ACT inhaler Inhale 2 puffs into the lungs every 6 (six) hours as needed.      . clopidogrel (PLAVIX) 75 MG tablet Take 75 mg by mouth daily.         No Known Allergies  Review of Systems  Constitutional: Positive for unexpected weight change (lost 40   pounds since stroke in 2011).  Respiratory: Positive for cough (productive, no hemoptysis), shortness of breath (with exertion) and wheezing.   Cardiovascular: Negative for palpitations and leg swelling. Chest pain: "like being hit with a hammer in the chest"  Genitourinary: Positive for dysuria and urgency. Negative for discharge.  Neurological: Positive for weakness (left sided since stroke in 2011).       Walks with cane/ walker  All other systems reviewed and are negative.    BP 100/68  Pulse 80  Resp 18  Ht 5' 6" (1.676 m)  Wt 112 lb (50.803 kg)  BMI 18.08 kg/m2  SpO2 94% Physical Exam  Vitals reviewed. Constitutional: He is oriented to person, place, and time.       Thin 66 yo male in NAD, appears undernourished with temporal and thenar wasting  HENT:    Head: Normocephalic and atraumatic.  Eyes: EOM are normal. Pupils are equal, round, and reactive to light.  Neck: Neck supple. No thyromegaly present.  Cardiovascular: Normal rate, regular rhythm and normal heart sounds.   No murmur heard. Pulmonary/Chest:       Diminished BS bilaterally  Abdominal: Soft. There is no tenderness.  Lymphadenopathy:    He has no cervical adenopathy.  Neurological: He is alert and oriented to person, place, and time. Coordination (walks with limp, lifting left leg) abnormal.       5/5 on right, 4/5 LUE and LLE  Skin: Skin is warm and dry.     Diagnostic Tests: CXR 11/11/11 Clinical Data: Productive cough for 2 months. Abnormal chest x-  ray.  CHEST - 2 VIEW  Comparison: Two-view chest 09/12/2011.  Findings: The heart size is normal. Ill-defined superior segment  right lower lobe airspace disease is again noted. There is slight  improvement since the prior study. Emphysematous changes are seen.  No other focal airspace disease is evident.  IMPRESSION:  1. Persistent medial superior segment right lower lobe airspace  opacification. Although there is some improvement, this has not  resolved over 2 months. CT of the chest with contrast is  recommended for further evaluation.  2. Emphysema.   CT Chest 12/14/11 Clinical Data: Emphysema. Abnormal chest radiograph.  CT CHEST WITH CONTRAST  Technique: Multidetector CT imaging of the chest was performed  following the standard protocol during bolus administration of  intravenous contrast.  Contrast: 80mL OMNIPAQUE IOHEXOL 300 MG/ML SOLN  Comparison: Chest radiograph 11/11/2011.  Findings: Right paratracheal lymph node measures 12 mm (image 17).  There is right hilar lymphoid tissue without discretely enlarged  lymph node. No left hilar or axillary adenopathy. Atherosclerotic  calcification of the arterial vasculature, including coronary  arteries. Heart size normal. No pericardial effusion.  A  spiculated mass in the superior segment right lower lobe measures  3.9 x 3.8 cm. It retracts the adjacent major fissure. Emphysema.  An irregular nodular density in the superior segment left lower  lobe measures 7 x 9 mm (image 25). No pleural fluid. Airway is  unremarkable.  Incidental imaging of the upper abdomen shows calcification along  the splenic capsule. No worrisome lytic or sclerotic lesions.  Degenerative changes are seen in the spine.  IMPRESSION:  1. Spiculated right lower lobe mass is most consistent with  primary bronchogenic carcinoma. Consultation with the [facility] ([phone]) should be  considered. These results will be called to the ordering clinician  or representative by the Radiologist Assistant, and communication  documented in the PACS Dashboard.  2.   Irregular left lower lobe nodule. Synchronous bronchogenic  carcinoma could have this appearance.  3. Minimally enlarged right paratracheal lymph node. Right hilar  lymphoid tissue without discrete lymph node.   Impression: 66-year-old gentleman with ongoing heavy tobacco abuse and COPD who has been found to have a 5 cm right lung mass, which almost surely is a non-small cell lung carcinoma. He also has about a 1 cm mass in his superior segment of his left lower lobe, which is suspicious for a synchronous lesion. He has some mildly enlarged mediastinal nodes. I discussed as well as I could with Benjamin Moss these findings. He does seem to understand that this almost certainly is lung cancer, it needs to be treated as such. I discussed with him the diagnostic and staging workup.  I recommended to him that we proceed with clinical staging and biopsy. He will need a PET/CT as well as an MRI of the brain to assess the extent of disease. He also needs pulmonary function testing room air blood gas to assess his pulmonary reserve. Think the best approach in terms of diagnosis would be to  proceed with electromagnetic navigational bronchoscopy to biopsy both lower lobe masses, at the same setting we would also do an EBUS to assess the mediastinal nodes. I discussed in detail with him the operative procedure. He understands that this would be done in the operating room under general anesthesia, we would do this on an outpatient basis. He understands that this is not guaranteed to be definitively diagnostic. We did discuss the risk which include complications related to anesthesia such as stroke or MI, bleeding, pneumothorax, and inability to establish a diagnosis. He accepts these risks and wishes to proceed.  Plan: Needs: 1. PET/ CT- for clinical staging  2. PFTs with RA ABG to determine pulmonary reserve  3. MR Brain- clinical staging to r/o brain mets.  4. ENB to biopsy right and left LL nodules/ EBUS to sample mediastinal lymph nodes- tentatively scheduled for Monday 10/14 

## 2012-01-16 NOTE — Preoperative (Signed)
Beta Blockers   Reason not to administer Beta Blockers:Not Applicable 

## 2012-01-16 NOTE — Interval H&P Note (Signed)
History and Physical Interval Note:  01/16/2012 7:30 AM  Benjamin Moss  has presented today for surgery, with the diagnosis of (R)LUNG MASS  The various methods of treatment have been discussed with the patient and family. After consideration of risks, benefits and other options for treatment, the patient has consented to  Procedure(s) (LRB) with comments: VIDEO BRONCHOSCOPY WITH ENDOBRONCHIAL NAVIGATION (N/A) VIDEO BRONCHOSCOPY WITH ENDOBRONCHIAL ULTRASOUND (N/A) as a surgical intervention .  The patient's history has been reviewed, patient examined, no change in status, stable for surgery.  I have reviewed the patient's chart and labs.  Questions were answered to the patient's satisfaction.     Salil Raineri C

## 2012-01-16 NOTE — Anesthesia Postprocedure Evaluation (Signed)
  Anesthesia Post-op Note  Patient: Benjamin Moss  Procedure(s) Performed: Procedure(s) (LRB) with comments: VIDEO BRONCHOSCOPY WITH ENDOBRONCHIAL NAVIGATION (N/A) VIDEO BRONCHOSCOPY WITH ENDOBRONCHIAL ULTRASOUND (N/A)  Patient Location: PACU  Anesthesia Type: General  Level of Consciousness: awake  Airway and Oxygen Therapy: Patient Spontanous Breathing  Post-op Pain: mild  Post-op Assessment: Post-op Vital signs reviewed  Post-op Vital Signs: Reviewed  Complications: No apparent anesthesia complications

## 2012-01-16 NOTE — Brief Op Note (Addendum)
01/16/2012  11:04 AM  PATIENT:  Benjamin Moss  66 y.o. male  PRE-OPERATIVE DIAGNOSIS:  Right LUNG MASS  POST-OPERATIVE DIAGNOSIS:  Right Lung Mass  PROCEDURE:  Procedure(s) (LRB) with comments: VIDEO BRONCHOSCOPY WITH ENDOBRONCHIAL NAVIGATION (N/A) VIDEO BRONCHOSCOPY WITH ENDOBRONCHIAL ULTRASOUND (N/A)  SURGEON:  Surgeon(s) and Role:    * Loreli Slot, MD - Primary   ANESTHESIA:   general  EBL:  Total I/O In: 1000 [I.V.:1000] Out: 0   SPECIMEN:  Source of Specimen:  RLL mass, LLL mass, Level 4R and 7 nodes  DISPOSITION OF SPECIMEN:  PATHOLOGY  PLAN OF CARE: Admit for overnight observation  PATIENT DISPOSITION:  PACU - hemodynamically stable.   Delay start of Pharmacological VTE agent (>24hrs) due to surgical blood loss or risk of bleeding: not applicable  2nd set of brushings from RLL + NSCCA

## 2012-01-16 NOTE — Anesthesia Procedure Notes (Signed)
Procedure Name: Intubation Date/Time: 01/16/2012 7:51 AM Performed by: Angelica Pou Pre-anesthesia Checklist: Patient identified, Emergency Drugs available, Suction available, Patient being monitored and Timeout performed Patient Re-evaluated:Patient Re-evaluated prior to inductionOxygen Delivery Method: Circle system utilized Preoxygenation: Pre-oxygenation with 100% oxygen Intubation Type: IV induction Ventilation: Mask ventilation without difficulty and Oral airway inserted - appropriate to patient size Laryngoscope Size: Mac and 3 Grade View: Grade I Tube type: Oral Number of attempts: 1 Airway Equipment and Method: Stylet and Oral airway Placement Confirmation: ETT inserted through vocal cords under direct vision,  breath sounds checked- equal and bilateral and positive ETCO2 Secured at: 22 cm Tube secured with: Tape Dental Injury: Teeth and Oropharynx as per pre-operative assessment

## 2012-01-16 NOTE — Transfer of Care (Signed)
Immediate Anesthesia Transfer of Care Note  Patient: Benjamin Moss  Procedure(s) Performed: Procedure(s) (LRB) with comments: VIDEO BRONCHOSCOPY WITH ENDOBRONCHIAL NAVIGATION (N/A) VIDEO BRONCHOSCOPY WITH ENDOBRONCHIAL ULTRASOUND (N/A)  Patient Location: PACU  Anesthesia Type: General  Level of Consciousness: awake and sedated  Airway & Oxygen Therapy: Patient Spontanous Breathing and Patient connected to face mask oxygen  Post-op Assessment: Report given to PACU RN and Post -op Vital signs reviewed and stable  Post vital signs: Reviewed and stable  Complications: No apparent anesthesia complications

## 2012-01-17 MED ORDER — HYDROCODONE-ACETAMINOPHEN 10-325 MG PO TABS
1.0000 | ORAL_TABLET | Freq: Four times a day (QID) | ORAL | Status: DC | PRN
Start: 2012-01-17 — End: 2012-02-06

## 2012-01-17 NOTE — Discharge Summary (Signed)
He will follow up at [facility] clinic

## 2012-01-17 NOTE — Progress Notes (Addendum)
                   [address]            [place] [address]          [phone]    1 Day Post-Op Procedure(s) (LRB): VIDEO BRONCHOSCOPY WITH ENDOBRONCHIAL NAVIGATION (N/A) VIDEO BRONCHOSCOPY WITH ENDOBRONCHIAL ULTRASOUND (N/A)  Subjective: Patient's only complaint is that of a little sore throat.  Objective: Vital signs in last 24 hours: Patient Vitals for the past 24 hrs:  BP Temp Temp src Pulse Resp SpO2 Height  01/17/12 0525 118/89 mmHg 97.8 F (36.6 C) Oral 67  16  99 % -  01/16/12 2100 100/71 mmHg 97.9 F (36.6 C) Oral 65  20  90 % -  01/16/12 1804 117/78 mmHg 98.1 F (36.7 C) - 63  18  91 % -  01/16/12 1801 90/58 mmHg - - - - - -  01/16/12 1301 98/56 mmHg 97.5 F (36.4 C) - 50  18  95 % 5\' 5"  (1.651 m)  01/16/12 1249 - 97.5 F (36.4 C) - 212  - - -  01/16/12 1215 105/77 mmHg - - 53  18  93 % -  01/16/12 1200 101/73 mmHg - - 53  19  92 % -  01/16/12 1145 100/73 mmHg 97.5 F (36.4 C) - 54  22  93 % -  01/16/12 1130 100/72 mmHg - - 56  21  94 % -  01/16/12 1118 - - - - - 88 % -  01/16/12 1115 99/76 mmHg - - 57  18  93 % -  01/16/12 1100 107/73 mmHg 96.8 F (36 C) - 58  18  100 % -  01/16/12 1045 110/77 mmHg - - 69  23  100 % -  01/16/12 1038 108/78 mmHg - - 61  19  100 % -      Intake/Output from previous day: 10/14 0701 - 10/15 0700 In: 2058.3 [P.O.:240; I.V.:1818.3] Out: 0    Physical Exam:  Cardiovascular: RRR, no murmurs, gallops, or rubs. Pulmonary: Clear to auscultation bilaterally; no rales, wheezes, or rhonchi.  Lab Results: CBC: Basename 01/16/12 0602  WBC 5.4  HGB 15.5  HCT 43.6  PLT 191   BMET:  Basename 01/16/12 0602  NA 139  K 3.5  CL 102  CO2 20  GLUCOSE 105*  BUN 29*  CREATININE 1.13  CALCIUM 10.0    PT/INR:  Basename 01/16/12 0602  LABPROT 12.8  INR 0.97   ABG:  INR: Will add last result for INR, ABG once components are confirmed Will add last 4 CBG results once components are  confirmed  Assessment/Plan:  1. CV - SR 2.  Pulmonary - stable.Pathology results not available yet. 3.Discharge home  ZIMMERMAN,DONIELLE MPA-C 01/17/2012,8:03 AM   Will d/c home today Informed patient of path results- [facility] of RLL Results for LLL and mediastinal nodes still pending Will have him follow up in [facility] either this week or next

## 2012-01-17 NOTE — Progress Notes (Signed)
Patient discharged to home with instructions. 

## 2012-01-17 NOTE — Discharge Summary (Signed)
Physician Discharge Summary  Patient ID: Benjamin Moss MRN: [id number] DOB/AGE: 65-06-1945 65 y.o.  Admit date: 01/16/2012 Discharge date: 01/17/2012  Admission Diagnoses: 1. RLL lung mass (NSCC) 2.LLL lung mass 3.Mediastinal adenopathy 4.History of COPD 5.History of hyperlipidemia 6.History of HTN 7.History of tobacco abuse 8.History of stroke   Discharge Diagnoses:  1. RLL lung mass (NSCC) 2.LLL lung mass 3.Mediastinal adenopathy 4.History of COPD 5.History of hyperlipidemia 6.History of HTN 7.History of tobacco abuse 8.History of stroke  Procedure (s):  Electromagnetic navigational bronchoscopy with brushings,  biopsies and washings of both right lower lobe and left lower lobe  lesions, endobronchial ultrasound with aspiration of level 4R and 7  lymph nodes.   History of Presenting Illness: This is a 66 year old African American male who was being evaluated for a cough and shortness of breath this summer. Chest x-ray showed a right sided infiltrate. He was treated with antibiotics without relief. A repeat chest x-ray showed a persistent right lung opacity and a CT scan was ordered. A CT on 12/14/2011 showed a 5 cm mass in the superior segment right lower lobe posteriorly adjacent to the vertebral column. There also was a 1 cm mass in the superior segment of the left lower lobe. There was also some mild mediastinal adenopathy. He was then is referred to Dr. Dorris Fetch for surgical consideration.       He says he has had a cough for several months. It has been productive at times. He does not have hemoptysis. He says he has had some chest pain with a cough that feels like someone "hitting me with a hammer in the chest." With further questioning, he does not have exertional chest tightness or pressure as best I can tell. He does have wheezing "sometimes". He says his appetite is good but he is lost about 40 pounds since his stroke in 2012 appear. 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 He says that he walks with a cane or walker. Potential risks, complications, and benefits of the surgery were discussed with the patient and he agreed to proceed. He was admitted to [facility] on 01/16/2012 in order to undergo an electromagnetic navigational bronchoscopy  with brushings and biopsies of RLL and LLL masses and and EBUS of lymph nodes.   Brief Hospital Course:  He has remained afebrile and hemodynamically stable.His only complaint was that of a sore throat. He was tolerating a diet this am. He is felts surgically stable for discharge today.   Latest Vital Signs: Blood pressure 118/89, pulse 67, temperature 97.8 F (36.6 C), temperature source Oral, resp. rate 16, height 5\' 5"  (1.651 m), weight 107 lb 4 oz (48.648 kg), SpO2 99.00%.  Physical Exam:  Cardiovascular: RRR, no murmurs, gallops, or rubs.  Pulmonary: Clear to auscultation bilaterally; no rales, wheezes, or rhonchi.   Discharge Condition:Stable  Recent laboratory studies:  Lab Results  Component Value Date   WBC 5.4 01/16/2012   HGB 15.5 01/16/2012   HCT 43.6 01/16/2012   MCV 91.6 01/16/2012   PLT 191 01/16/2012   Lab Results  Component Value Date   NA 139 01/16/2012   K 3.5 01/16/2012   CL 102 01/16/2012   CO2 20 01/16/2012   CREATININE 1.13 01/16/2012   GLUCOSE  105* 01/16/2012      Diagnostic Studies:    Dg Chest 2 View Within Previous 72 Hours.  Films Obtained On Friday Are Acceptable For Monday And Tuesday Cases  01/16/2012  *RADIOLOGY REPORT*  Clinical Data: Preop for lung surgery and bronchoscopy.  Shortness of breath.  Chest pain.  Headache.  Cough.  CHEST - 2 VIEW  Comparison: 11/10/2037  Findings: Mass in the superior segment right lower lung  medially measuring 3.8 cm diameter.  Stable appearance since previous study. Diffuse emphysematous changes and scattered fibrosis in the lungs. No blunting of costophrenic angles.  No pneumothorax.  Calcified and tortuous aorta.  Normal heart size and pulmonary vascularity. No pneumothorax.  Degenerative changes in the thoracic spine.  IMPRESSION: Stable appearance of mass in the medial superior segment right lower lung.  Diffuse emphysematous changes.   Original Report Authenticated By: Marlon Pel, M.D.    Dg C-arm Bronchoscopy  01/16/2012  CLINICAL DATA: Right Lower Lobe Mass, Navigation Bronchoscopy   C-ARM BRONCHOSCOPY  Fluoroscopy was utilized by the requesting physician.  No radiographic  interpretation.     Discharge Medications:   Medication List     As of 01/17/2012  8:36 AM    TAKE these medications         albuterol 108 (90 BASE) MCG/ACT inhaler   Commonly known as: PROVENTIL HFA;VENTOLIN HFA   Inhale 2 puffs into the lungs every 6 (six) hours as needed. For shortness of breath      amLODipine 10 MG tablet   Commonly known as: NORVASC   Take 10 mg by mouth daily.      clopidogrel 75 MG tablet   Commonly known as: PLAVIX   Take 75 mg by mouth daily.      hydrochlorothiazide 25 MG tablet   Commonly known as: HYDRODIURIL   Take 25 mg by mouth daily.      HYDROcodone-acetaminophen 10-325 MG per tablet   Commonly known as: NORCO   Take 1 tablet by mouth every 6 (six) hours as needed for pain.      lisinopril 10 MG tablet   Commonly known as: PRINIVIL,ZESTRIL   Take 10 mg by mouth daily.      pravastatin 80 MG tablet   Commonly known as: PRAVACHOL   Take 80 mg by mouth daily.        Follow Up Appointments:     Follow-up Information    Follow up with Loreli Slot, MD. (Office will call with an appointment)    Contact information:   [address] [place] [address] [phone]          Signed: Doree Fudge  MPA-C 01/17/2012, 8:36 AM

## 2012-01-17 NOTE — Op Note (Signed)
NAME:  JAMON, HAYHURST NO.:  [id number]  MEDICAL RECORD NO.:  [id number]  LOCATION:  [id number]                        FACILITY:  [facility]  PHYSICIAN:  Salvatore Decent. Roschelle Calandra, M.D.DATE OF BIRTH:  1945-04-09  DATE OF PROCEDURE:  01/16/2012 DATE OF DISCHARGE:                              OPERATIVE REPORT   PREOPERATIVE DIAGNOSES:  Right lower lobe mass, left lower lobe mass, mediastinal adenopathy.  POSTOPERATIVE DIAGNOSES:  Right lower lobe mass, left lower lobe mass, mediastinal adenopathy, right lower lobe mass, non-small cell carcinoma.  PROCEDURE:  Electromagnetic navigational bronchoscopy with brushings, biopsies and washings of both right lower lobe and left lower lobe lesions, endobronchial ultrasound with aspiration of level 4R and 7 lymph nodes.  SURGEON:  Salvatore Decent. Dorris Fetch, MD  ASSISTANT:  None.  ANESTHESIA:  General.  FINDINGS:  Initial quick prep of brushings of right lower lobe mass, nondiagnostic.  Repeat specimens on second try revealed non-small-cell carcinoma.  CLINICAL NOTE:  Mr. Azucena is a 66 year old gentleman with a history of tobacco abuse and COPD, who presented with a persistent cough  Workup revealed a 5-cm mass in the superior segment of the right lower lobe as well as a 1-cm mass in the superior segment of the left lower lobe.  PET scan showed hypermetabolic activity in both these lesions as well as in mildly enlarged mediastinal lymph nodes.  The patient was advised to undergo electromagnetic navigational bronchoscopy and endobronchial ultrasound for diagnostic and staging purposes.  The indications, risks, benefits, and alternatives were discussed in detail with the patient.  He understood and accepted the risks and agreed to proceed.  OPERATIVE NOTE:  Mr. Debella was brought to the preoperative holding area on January 16, 2012, there Anesthesia established intravenous access. He was taken to the operating room, anesthetized, and  intubated. Flexible fiberoptic bronchoscopy was performed.  It revealed normal endobronchial anatomy and no endobronchial lesions.  There were thick clear secretions.  The locatable guide for the electromagnetic navigational bronchoscopy then was advanced.  Initially, a 90-degree sheath was used.  The scope was placed at the origin of the superior segmental bronchus of the right lower lobe and then the sheath and locatable guide were advanced, navigating to the vicinity of the mass.  The locatable guide was removed.  Brushings, biopsies and then a second set of brushings were obtained visualizing the sampling with fluoroscopy.By fluoroscopy and the navigational guide it appeared that this was along the edge of the mass, but did appear to be close enough to potentially make a diagnosis.  After these specimens were sent for quick prep, the bronchoscope then was directed to the origin of the superior segmental bronchus of the left lower lobe.  Again using the 90-degree sheath and locatable guide, the sheath was navigated to within 1.5 cm of the left lower lobe mass. Again, multiple brushings, multiple biopsies and a bronchoalveolar lavage were performed.  The specimens were all sent for permanent pathology.  By this point, the initial quick preps of the brushings from the right lower lobe mass came back nondiagnostic. Decision was made to change out the sheath to a 180-degree sheath. Locatable guide was placed into the  sheath and then once again advanced to the right superior segmental mass.  The locatable guide was removed.  Additional brushings and biopsies were performed.  At this time, fluoroscopy showed the instruments to be more centered within the mass and the brushings were sent for quick prep once again.  Bronchoalveolar lavage was performed while awaiting the results of the brushings, those subsequently returned positive for non-small cell carcinoma.  Next, the bronchoscope was  removed.  The endobronchial ultrasound probe then was advanced through the endotracheal tube.  Level 4R and 7 nodes were identified, these did appear to be slightly enlarged, but were not massively enlarged.  Aspirations were performed with two aspirations performed at both the level of 4R and 7 sites, these were done with visualization of the needle within the node and multiple passes with the needle on each occasion as visualized with the ultrasound probe, these specimens were sent for permanent cytology.  The ultrasound scope was removed.  The regular bronchoscope then was replaced, there was no active ongoing bleeding.  Secretions were cleared, the bronchoscope was removed.  The patient was extubated in the operating room and taken to the postanesthetic care unit in good condition.     Salvatore Decent Dorris Fetch, M.D.     SCH/MEDQ  D:  01/16/2012  T:  01/17/2012  Job:  [id number]

## 2012-01-19 ENCOUNTER — Ambulatory Visit
Admission: RE | Admit: 2012-01-19 | Discharge: 2012-01-19 | Disposition: A | Discharge status: Home / Self Care | Payer: Medicare Other | Source: Ambulatory Visit | Attending: Radiation Oncology | Admitting: Radiation Oncology

## 2012-01-19 ENCOUNTER — Encounter: Payer: Self-pay | Admitting: *Deleted

## 2012-01-19 ENCOUNTER — Encounter: Payer: Self-pay | Admitting: Internal Medicine

## 2012-01-19 ENCOUNTER — Encounter: Payer: Self-pay | Admitting: Radiation Oncology

## 2012-01-19 ENCOUNTER — Ambulatory Visit (HOSPITAL_BASED_OUTPATIENT_CLINIC_OR_DEPARTMENT_OTHER): Payer: Medicare Other | Admitting: Internal Medicine

## 2012-01-19 VITALS — BP 125/84 | HR 79 | Temp 98.0°F | Resp 20 | Ht 66.0 in | Wt 112.0 lb

## 2012-01-19 DIAGNOSIS — F17200 Nicotine dependence, unspecified, uncomplicated: Secondary | ICD-10-CM

## 2012-01-19 DIAGNOSIS — R51 Headache: Secondary | ICD-10-CM

## 2012-01-19 DIAGNOSIS — C3430 Malignant neoplasm of lower lobe, unspecified bronchus or lung: Secondary | ICD-10-CM

## 2012-01-19 DIAGNOSIS — R599 Enlarged lymph nodes, unspecified: Secondary | ICD-10-CM

## 2012-01-19 DIAGNOSIS — C3490 Malignant neoplasm of unspecified part of unspecified bronchus or lung: Secondary | ICD-10-CM

## 2012-01-19 HISTORY — DX: Malignant neoplasm of unspecified part of unspecified bronchus or lung: C34.90

## 2012-01-19 NOTE — Progress Notes (Signed)
[facility] Telephone:([phone]) [phone]   Fax:([phone]  CONSULT NOTE  REASON FOR CONSULTATION:  66 years old [place] American male diagnosed with lung cancer.  HPI Benjamin Moss is a 66 y.o. male was past medical history significant for COPD, dyslipidemia, hypertension and stroke 2 years ago as well as long history of smoking. The patient mentions that he presented in early August of 2013 to the emergency Department complaining of cough, shortness of breath as well as chest pain. Chest x-ray was performed on 11/11/2011 and it showed persistent medial superior segment right lower lobe airspace opacification that has not resolved over 2 months. CT of the chest with contrast was performed on 12/14/2014 and it showed a spiculated mass in the superior segment of the right lower lobe measured 3.9 x 3.8 CM. There was also an irregular left lower lobe nodule. Synchronous bronchogenic carcinoma could have this appearance. There was also many many enlarging right paratracheal lymph node, right hilar lymphoid tissue without discrete lymph node. The patient was seen by Dr. Dorris Fetch and a PET scan was performed on 01/09/2012 and it showed intensely hypermetabolic right lower lobe mass abutting the pleural surfaces consistent with bronchogenic carcinoma. There was also concern for synchronous bronchogenic carcinoma in the superior aspect of the left lower lobe and there was hypermetabolic Precarinal and AP window adenopathy concerning for mediastinal metastasis. MRI of the brain on 01/09/2012 was negative for intracranial metastatic deposits but there was chronic right parietal infarct. There was also chronic ischemic changes and chronic areas of micro hemorrhage present in the brain. On 01/17/2012 the patient underwent electromagnetic navigational bronchoscopy with brushings, biopsies and washings of posterior right lower lobe and left lower lobe lesions, endobronchial ultrasound with aspiration  of level 4R and 7 lymph nodes. 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 Dr. Dorris Fetch kindly referred the patient to me today for further evaluation and recommendation regarding treatment of his condition. The patient was seen at the multidisciplinary thoracic oncology clinic [facility]).  His feeling fine today except for occasional pain in the epigastric area as well as shortness breath on exertion and mild cough but no hemoptysis and he lost around 12 pounds in the last few weeks. The patient also has occasional headache.  He doesn't know much about his family history but his mother died at age 41 and his father died in his 27s. The patient is single and has no children. He lives in [place] and used to work in [employer]. He has a history of smoking 2 packs per day for around 50 years and unfortunately he continues to smoke and I strongly encouraged him to quit smoking and offer him smoke cessation programs. No history of alcohol or drug abuse.  @SFHPI @   Past Medical History  Diagnosis Date  . Hyperlipidemia   . Hypertension   . COPD (chronic obstructive pulmonary disease)   . Stroke     left side weakness, ambulates with cane/ walker  . Right lower lobe lung mass   . Hypoxia   . Nodule of left lung   . Lung cancer 01/19/2012    Past Surgical History  Procedure Date  . Tonsillectomy     No family history on file.  Social History History  Substance Use Topics  . Smoking status: Current Every Day  Smoker -- 2.0 packs/day for 50 years    Types: Cigarettes  . Smokeless tobacco: Never Used  . Alcohol Use: No    No Known Allergies  Current Outpatient Prescriptions  Medication Sig Dispense Refill  . albuterol (PROVENTIL HFA;VENTOLIN HFA) 108 (90 BASE) MCG/ACT inhaler Inhale 2 puffs into the lungs  every 6 (six) hours as needed. For shortness of breath      . amLODipine (NORVASC) 10 MG tablet Take 10 mg by mouth daily.       . clopidogrel (PLAVIX) 75 MG tablet Take 75 mg by mouth daily.       . hydrochlorothiazide (HYDRODIURIL) 25 MG tablet Take 25 mg by mouth daily.       Marland Kitchen HYDROcodone-acetaminophen (NORCO) 10-325 MG per tablet Take 1 tablet by mouth every 6 (six) hours as needed for pain.  20 tablet  0  . lisinopril (PRINIVIL,ZESTRIL) 10 MG tablet Take 10 mg by mouth daily.       . pravastatin (PRAVACHOL) 80 MG tablet Take 80 mg by mouth daily.         Review of Systems  A comprehensive review of systems was negative except for: Constitutional: positive for fatigue and weight loss Respiratory: positive for cough, dyspnea on exertion and pleurisy/chest pain Gastrointestinal: positive for abdominal pain  Physical Exam  ZOX:WRUEA, healthy, no distress, well nourished and well developed SKIN: skin color, texture, turgor are normal HEAD: Normocephalic EYES: normal, PERRLA EARS: External ears normal OROPHARYNX:no exudate and no erythema  NECK: supple, no adenopathy LYMPH:  no palpable lymphadenopathy, no hepatosplenomegaly LUNGS: clear to auscultation  HEART: regular rate & rhythm, no murmurs and no gallops ABDOMEN:abdomen soft, non-tender, normal bowel sounds and no masses or organomegaly BACK: Back symmetric, no curvature. EXTREMITIES:no edema, no skin discoloration, no clubbing  NEURO: alert & oriented x 3 with fluent speech, no focal motor/sensory deficits  PERFORMANCE STATUS: ECOG 1  LABORATORY DATA: Lab Results  Component Value Date   WBC 5.4 01/16/2012   HGB 15.5 01/16/2012   HCT 43.6 01/16/2012   MCV 91.6 01/16/2012   PLT 191 01/16/2012      Chemistry      Component Value Date/Time   NA 139 01/16/2012 0602   K 3.5 01/16/2012 0602   CL 102 01/16/2012 0602   CO2 20 01/16/2012 0602   BUN 29* 01/16/2012 0602   CREATININE 1.13 01/16/2012 0602      Component  Value Date/Time   CALCIUM 10.0 01/16/2012 0602   ALKPHOS 94 01/16/2012 0602   AST 20 01/16/2012 0602   ALT 9 01/16/2012 0602   BILITOT 0.3 01/16/2012 0602       RADIOGRAPHIC STUDIES: Dg Skull 1-3 Views  01/09/2012  *RADIOLOGY REPORT*  Clinical Data: Evaluate for metal in the skull.  SKULL - 1-3 VIEW  Comparison: CT head 02/08/2010.  Findings: The calvarium is intact.  There is no visible radiopaque foreign body or defect.  IMPRESSION: The patient is cleared for MRI on the basis of metallic foreign body in the skull.   Original Report Authenticated By: Elsie Stain, M.D.    Dg Chest 2 View Within Previous 72 Hours.  Films Obtained On Friday Are Acceptable For Monday And Tuesday Cases  01/16/2012  *RADIOLOGY REPORT*  Clinical Data: Preop for lung surgery and bronchoscopy.  Shortness of breath.  Chest pain.  Headache.  Cough.  CHEST - 2 VIEW  Comparison: 11/10/2037  Findings: Mass in the superior segment right lower lung medially measuring 3.8 cm  diameter.  Stable appearance since previous study. Diffuse emphysematous changes and scattered fibrosis in the lungs. No blunting of costophrenic angles.  No pneumothorax.  Calcified and tortuous aorta.  Normal heart size and pulmonary vascularity. No pneumothorax.  Degenerative changes in the thoracic spine.  IMPRESSION: Stable appearance of mass in the medial superior segment right lower lung.  Diffuse emphysematous changes.   Original Report Authenticated By: Marlon Pel, M.D.    Mr Laqueta Jean Wo Contrast  01/09/2012  *RADIOLOGY REPORT*  Clinical Data: New diagnosis of lung mass.  Rule out metastatic disease.  Headache.  MRI HEAD WITHOUT AND WITH CONTRAST  Technique:  Multiplanar, multiecho pulse sequences of the brain and surrounding structures were obtained according to standard protocol without and with intravenous contrast  Contrast: 10mL MULTIHANCE GADOBENATE DIMEGLUMINE 529 MG/ML IV SOLN  Comparison: CT 02/08/2010, MRI 02/06/2010  Findings:  Chronic right parietal infarct.  Chronic ischemia throughout the cerebral white matter bilaterally.  Chronic left thalamic infarct and chronic infarct in the right internal capsule and periventricular white matter.  Brainstem and cerebellum are intact.  Negative for acute infarct.  Scattered areas of micro hemorrhage in the brain.  Chronic hemorrhagic infarct left thalamus again noted.  Findings are suggestive of chronic hypertension and micro bleed.  Postcontrast imaging reveals no enhancing lesion.  No evidence of metastatic disease to the brain.  IMPRESSION: Negative for intracranial metastatic deposits.  Chronic right parietal infarct.  Chronic ischemic changes and chronic areas of micro hemorrhage are present in the brain.   Original Report Authenticated By: Camelia Phenes, M.D.    Nm Pet Image Initial (pi) Skull Base To Thigh  01/09/2012  *RADIOLOGY REPORT*  Clinical Data: Initial treatment strategy for right lung mass.  NUCLEAR MEDICINE PET SKULL BASE TO THIGH  Fasting Blood Glucose:  104  Technique:  19.1 mCi F-18 FDG was injected intravenously. CT data was obtained and used for attenuation correction and anatomic localization only.  (This was not acquired as a diagnostic CT examination.) Additional exam technical data entered on technologist worksheet.  Comparison:  CT 12/14/2011  Findings:  Neck: No hypermetabolic lymph nodes in the neck.  Chest:  There is a hypermetabolic right lower lobe mass measuring 41 by 36 mm with intense metabolic activity ( SUV max = 16.1). Hypermetabolic precarinal lymph node with SUV max = 5.2. There is a hypermetabolic activity in the APwindow  whcih is suspicious for adenopathy. Nodes difficult to eight the difficult to define on this scan.  SUV max = 4.6.  Within the superior aspect of the left lower lobe there is an irregular 8 mm nodule which abuts the fissure with SUV max = 2.3.  Abdomen/Pelvis:  No abnormal hypermetabolic activity within the liver, pancreas, adrenal  glands, or spleen.  No hypermetabolic lymph nodes in the abdomen or pelvis.  Skeleton:  No focal hypermetabolic activity to suggest skeletal metastasis.  IMPRESSION:  1.  Intensely hypermetabolic right lower lobe mass abutting the pleural surfaces consistent with primary bronchogenic carcinoma. 2.  Concern for synchronous bronchogenic carcinoma in the superior aspect of the left lower lobe. 3.  Hypermetabolic precarinal and AP window adenopathy is concerning for mediastinal metastasis.   Original Report Authenticated By: Genevive Bi, M.D.    Ct Super D Chest Wo Contrast  01/06/2012  *RADIOLOGY REPORT*  Clinical Data:  Right lower lobe lung lesion, follow-up  CT CHEST WITHOUT CONTRAST  Technique:  Multidetector CT imaging of the chest was performed using thin slice collimation for  electromagnetic bronchoscopy planning purposes, without intravenous contrast.  Comparison:  CT chest of 12/14/2011  Findings:  Diffuse changes of centrilobular emphysema are noted. The irregularly marginated mass within the superior segment the right lower lobe posteromedially again is noted measuring 3.5 x 3.3 x 3.5 cm consistent with primary lung carcinoma.  This mass does abut the pleura in the posterior medial right lower lobe, as well as abutting the major fissure superiorly.  No additional lung lesion is seen.  No effusion is noted.  Some mucus i1s noted within the right mainstem bronchus but no other bronchial lesion is evident.  On soft tissue window images, the thyroid gland is unremarkable. On this unenhanced study, no mediastinal or hilar adenopathy is evident.  Coronary artery calcifications are noted which are advanced for age.  The portion of the upper abdomen that is visualized is unremarkable other than the presence of bilateral renal calculi which are not obstructing.  No bony abnormality is seen.  IMPRESSION:  1.  3.5 x 3.3 x 3.5 cm irregular mass in the superior segment of the right lower lobe consistent with  primary lung carcinoma. 2.  Centrilobular emphysema. 3.  Small bilateral renal calculi.  No obstruction.   Original Report Authenticated By: Juline Patch, M.D.    Dg C-arm Bronchoscopy  01/16/2012  CLINICAL DATA: Right Lower Lobe Mass, Navigation Bronchoscopy   C-ARM BRONCHOSCOPY  Fluoroscopy was utilized by the requesting physician.  No radiographic  interpretation.      ASSESSMENT: this is a very pleasant 66 years old African American male recently diagnosed with non-small cell lung cancer, squamous cell carcinoma presenting with synchronous tumor as stage IIIA (T2a., N2, M0) in the right lung and questionably stage IA (T1a., N0, M0) in the left lung based on the imaging studies also the final pathology did not show metastatic squamous cell carcinoma in the mediastinal lymph nodes or the left lower lobe mass which could be sampling error.  PLAN:I have a lengthy discussion with the patient today about his disease stage, prognosis and treatment options. We discussed his case earlier today at the weakness thoracic conference and the patient will be seen later today by Dr. Roselind Messier from radiation oncology. Recommendation was to consider the patient for a course of concurrent chemoradiation for the stage IIIa non-small cell lung cancer involving the right lower lobe with close monitoring of the left lower lobe lung nodule and consideration of stereotactic radiotherapy to this area if it showed evidence for progression in the future. I discussed with the patient his initial treatment for with concurrent chemoradiation. The chemotherapy would be in the form of carboplatin for AUC of 2 and paclitaxel 45 mg/M2 on a weekly basis concurrent with radiation for a total of 6-7 weeks. I discussed with the patient adverse effect of the chemotherapy including but not limited to alopecia, myelosuppression, nausea and vomiting, peripheral neuropathy, liver or renal dysfunction.   I will arrange for the patient to have a  chemotherapy education class next week. He is expected to start the first cycle of his treatment on 01/30/2012. The patient would come back for followup visit in 3 weeks for reevaluation and management any adverse effect of his treatment.   I will call his pharmacy with prescription for Compazine 10 mg by mouth every 6 hours as needed for nausea. The patient was also seen today by the thoracic Oncology Navigator for as well as the social worker at the cancer Center.  I gave the patient the time  to ask questions and I answered them completely to his satisfaction. The patient was advised to call immediately if he has any concerning symptoms and interval.  All questions were answered. The patient knows to call the clinic with any problems, questions or concerns. We can certainly see the patient much sooner if necessary.  Thank you so much for allowing me to participate in the care of Benjamin Moss. I will continue to follow up the patient with you and assist in his care.  I spent 30 minutes counseling the patient face to face. The total time spent in the appointment was 60 minutes.  CODE STATUS: Full code.  Elmond Poehlman K. 01/19/2012, 4:24 PM

## 2012-01-19 NOTE — Progress Notes (Signed)
[facility]  Clinical Social Work  Clinical Social Work met with patient and Systems developer at [employer] today. Medical oncologist discussed diagnosis and treatment plan.  Patient is agreeable to chemoradiation and has no questions at this time. Mr. Mastro has minimal support and received ride from friend today.  CSW registered patient for [facility] transportation. CSW will contact patient once appintments have been scheduled.  Kathrin Penner, MSW, LCSW Clinical Social Worker [facility] [phone]

## 2012-01-19 NOTE — Progress Notes (Signed)
Radiation Oncology         (336) [phone] ________________________________  Initial outpatient Consultation  Name: Benjamin Moss MRN: [id number]  Date: 01/19/2012  DOB: May 11, 1945  JX:[id number], MD  Loreli Slot, *   REFERRING PHYSICIAN: Loreli Slot, *  DIAGNOSIS: The encounter diagnosis was Lung cancer.  HISTORY OF PRESENT ILLNESS::Benjamin Moss is a 66 y.o. male who is seen out of the courtesy of Dr. Edwina Barth as part of the multidisciplinary breast clinic along with Dr. Arbutus Ped.  The patient presented earlier this year with cough and shortness of breath.  He was initially treated with antibiotics but a lesion in the right lower lobe persisted. 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  A biopsy was also performed of the left lower lobe lesion which revealed benign lung parenchyma although  given the size of this lesion this may have not been adequately sampled.  A PET scan was performed which revealed stage IIIa disease with activity noted in the precarinal and AP window lymph node  Areas. There was no activity outside the chest on the patient's PET scan. MRI the brain showed no intracranial metastasis.  Patient is now seen to be considered for therapy and evaluation.   PREVIOUS RADIATION THERAPY: No  PAST MEDICAL HISTORY:  has a past medical history of Hyperlipidemia; Hypertension; COPD (chronic obstructive pulmonary disease); Stroke; Right lower lobe lung mass; Hypoxia; Nodule of left lung; and Lung cancer (01/19/2012).    PAST SURGICAL HISTORY: Past Surgical History  Procedure Date  . Tonsillectomy     FAMILY HISTORY: family history is not on file.  SOCIAL HISTORY:  reports that he has been smoking Cigarettes.  He has a 100 pack-year smoking history. He has never used smokeless tobacco. He reports that he does not drink alcohol or use illicit drugs.  ALLERGIES: Review of patient's  allergies indicates no known allergies.  MEDICATIONS:  Current Outpatient Prescriptions  Medication Sig Dispense Refill  . albuterol (PROVENTIL HFA;VENTOLIN HFA) 108 (90 BASE) MCG/ACT inhaler Inhale 2 puffs into the lungs every 6 (six) hours as needed. For shortness of breath      . amLODipine (NORVASC) 10 MG tablet Take 10 mg by mouth daily.       . clopidogrel (PLAVIX) 75 MG tablet Take 75 mg by mouth daily.       . hydrochlorothiazide (HYDRODIURIL) 25 MG tablet Take 25 mg by mouth daily.       Marland Kitchen HYDROcodone-acetaminophen (NORCO) 10-325 MG per tablet Take 1 tablet by mouth every 6 (six) hours as needed for pain.  20 tablet  0  . lisinopril (PRINIVIL,ZESTRIL) 10 MG tablet Take 10 mg by mouth daily.       . pravastatin (PRAVACHOL) 80 MG tablet Take 80 mg by mouth daily.         REVIEW OF SYSTEMS:  A 15 point review of systems is documented in the electronic medical record. This was obtained by the nursing staff. However, I reviewed this with the patient to discuss relevant findings and make appropriate changes.  Patient complains of some weakness in his left hand since his prior stroke. Also complains of weakness in his right lower leg since his stroke. he does have occasional hemoptysis.  he also has some pain along the anterior chest region.  PHYSICAL EXAM:  height is 5\' 6"  (1.676 m) and weight is 112 lb (50.803 kg). His oral temperature is 98 F (36.7  C). His blood pressure is 125/84 and his pulse is 79. His respiration is 20 and oxygen saturation is 93%.  oxygen saturation without supplemental oxygen initially was 84% BP 125/84  Pulse 79  Temp 98 F (36.7 C) (Oral)  Resp 20  Ht 5\' 6"  (1.676 m)  Wt 112 lb (50.803 kg)  BMI 18.08 kg/m2  SpO2 93%     Head:   temporal wasting   Eyes:    PERRL, conjunctiva/corneas clear, EOM's intact,       Nose:   Nares normal, septum midline, mucosa normal, no drainage    or sinus tenderness  Throat:   Lips, mucosa, and tongue normal; teeth and gums  normal, poor dentition with several teeth missing   Neck:   Supple, symmetrical, trachea midline, no adenopathy;       thyroid:  No enlargement/tenderness/nodules; no carotid   bruit or JVD  Back:     Symmetric, no curvature, ROM normal, no CVA tenderness  Lungs:     Clear to auscultation bilaterally, respirations unlabored  Chest wall:    No tenderness or deformity  Heart:    Regular rate and rhythm,   Abdomen:     Soft, non-tender, bowel sounds active all four quadrants,    no masses, no organomegaly        Extremities:   Extremities normal, atraumatic, no cyanosis or edema  Pulses:   2+ and symmetric all extremities  Skin:   Skin color, texture, turgor normal, no rashes or lesions  Lymph nodes:   Cervical, supraclavicular, and axillary nodes normal  Neurologic:   CNII-XII intact. Normal strength, sensation and reflexes      Throughout, possibly left grip strength decreased     LABORATORY DATA:  Lab Results  Component Value Date   WBC 5.4 01/16/2012   HGB 15.5 01/16/2012   HCT 43.6 01/16/2012   MCV 91.6 01/16/2012   PLT 191 01/16/2012   Lab Results  Component Value Date   NA 139 01/16/2012   K 3.5 01/16/2012   CL 102 01/16/2012   CO2 20 01/16/2012   Lab Results  Component Value Date   ALT 9 01/16/2012   AST 20 01/16/2012   ALKPHOS 94 01/16/2012   BILITOT 0.3 01/16/2012     RADIOGRAPHY: Dg Skull 1-3 Views  01/09/2012  *RADIOLOGY REPORT*  Clinical Data: Evaluate for metal in the skull.  SKULL - 1-3 VIEW  Comparison: CT head 02/08/2010.  Findings: The calvarium is intact.  There is no visible radiopaque foreign body or defect.  IMPRESSION: The patient is cleared for MRI on the basis of metallic foreign body in the skull.   Original Report Authenticated By: Elsie Stain, M.D.    Dg Chest 2 View Within Previous 72 Hours.  Films Obtained On Friday Are Acceptable For Monday And Tuesday Cases  01/16/2012  *RADIOLOGY REPORT*  Clinical Data: Preop for lung surgery and  bronchoscopy.  Shortness of breath.  Chest pain.  Headache.  Cough.  CHEST - 2 VIEW  Comparison: 11/10/2037  Findings: Mass in the superior segment right lower lung medially measuring 3.8 cm diameter.  Stable appearance since previous study. Diffuse emphysematous changes and scattered fibrosis in the lungs. No blunting of costophrenic angles.  No pneumothorax.  Calcified and tortuous aorta.  Normal heart size and pulmonary vascularity. No pneumothorax.  Degenerative changes in the thoracic spine.  IMPRESSION: Stable appearance of mass in the medial superior segment right lower lung.  Diffuse emphysematous changes.  Original Report Authenticated By: Marlon Pel, M.D.    Mr Laqueta Jean Wo Contrast  01/09/2012  *RADIOLOGY REPORT*  Clinical Data: New diagnosis of lung mass.  Rule out metastatic disease.  Headache.  MRI HEAD WITHOUT AND WITH CONTRAST  Technique:  Multiplanar, multiecho pulse sequences of the brain and surrounding structures were obtained according to standard protocol without and with intravenous contrast  Contrast: 10mL MULTIHANCE GADOBENATE DIMEGLUMINE 529 MG/ML IV SOLN  Comparison: CT 02/08/2010, MRI 02/06/2010  Findings: Chronic right parietal infarct.  Chronic ischemia throughout the cerebral white matter bilaterally.  Chronic left thalamic infarct and chronic infarct in the right internal capsule and periventricular white matter.  Brainstem and cerebellum are intact.  Negative for acute infarct.  Scattered areas of micro hemorrhage in the brain.  Chronic hemorrhagic infarct left thalamus again noted.  Findings are suggestive of chronic hypertension and micro bleed.  Postcontrast imaging reveals no enhancing lesion.  No evidence of metastatic disease to the brain.  IMPRESSION: Negative for intracranial metastatic deposits.  Chronic right parietal infarct.  Chronic ischemic changes and chronic areas of micro hemorrhage are present in the brain.   Original Report Authenticated By: Camelia Phenes, M.D.    Nm Pet Image Initial (pi) Skull Base To Thigh  01/09/2012  *RADIOLOGY REPORT*  Clinical Data: Initial treatment strategy for right lung mass.  NUCLEAR MEDICINE PET SKULL BASE TO THIGH  Fasting Blood Glucose:  104  Technique:  19.1 mCi F-18 FDG was injected intravenously. CT data was obtained and used for attenuation correction and anatomic localization only.  (This was not acquired as a diagnostic CT examination.) Additional exam technical data entered on technologist worksheet.  Comparison:  CT 12/14/2011  Findings:  Neck: No hypermetabolic lymph nodes in the neck.  Chest:  There is a hypermetabolic right lower lobe mass measuring 41 by 36 mm with intense metabolic activity ( SUV max = 40.9). Hypermetabolic precarinal lymph node with SUV max = 5.2. There is a hypermetabolic activity in the APwindow  whcih is suspicious for adenopathy. Nodes difficult to eight the difficult to define on this scan.  SUV max = 4.6.  Within the superior aspect of the left lower lobe there is an irregular 8 mm nodule which abuts the fissure with SUV max = 2.3.  Abdomen/Pelvis:  No abnormal hypermetabolic activity within the liver, pancreas, adrenal glands, or spleen.  No hypermetabolic lymph nodes in the abdomen or pelvis.  Skeleton:  No focal hypermetabolic activity to suggest skeletal metastasis.  IMPRESSION:  1.  Intensely hypermetabolic right lower lobe mass abutting the pleural surfaces consistent with primary bronchogenic carcinoma. 2.  Concern for synchronous bronchogenic carcinoma in the superior aspect of the left lower lobe. 3.  Hypermetabolic precarinal and AP window adenopathy is concerning for mediastinal metastasis.   Original Report Authenticated By: Genevive Bi, M.D.    Ct Super D Chest Wo Contrast  01/06/2012  *RADIOLOGY REPORT*  Clinical Data:  Right lower lobe lung lesion, follow-up  CT CHEST WITHOUT CONTRAST  Technique:  Multidetector CT imaging of the chest was performed using thin slice  collimation for electromagnetic bronchoscopy planning purposes, without intravenous contrast.  Comparison:  CT chest of 12/14/2011  Findings:  Diffuse changes of centrilobular emphysema are noted. The irregularly marginated mass within the superior segment the right lower lobe posteromedially again is noted measuring 3.5 x 3.3 x 3.5 cm consistent with primary lung carcinoma.  This mass does abut the pleura in the posterior medial right lower  lobe, as well as abutting the major fissure superiorly.  No additional lung lesion is seen.  No effusion is noted.  Some mucus i1s noted within the right mainstem bronchus but no other bronchial lesion is evident.  On soft tissue window images, the thyroid gland is unremarkable. On this unenhanced study, no mediastinal or hilar adenopathy is evident.  Coronary artery calcifications are noted which are advanced for age.  The portion of the upper abdomen that is visualized is unremarkable other than the presence of bilateral renal calculi which are not obstructing.  No bony abnormality is seen.  IMPRESSION:  1.  3.5 x 3.3 x 3.5 cm irregular mass in the superior segment of the right lower lobe consistent with primary lung carcinoma. 2.  Centrilobular emphysema. 3.  Small bilateral renal calculi.  No obstruction.   Original Report Authenticated By: Juline Patch, M.D.    Dg C-arm Bronchoscopy  01/16/2012  CLINICAL DATA: Right Lower Lobe Mass, Navigation Bronchoscopy   C-ARM BRONCHOSCOPY  Fluoroscopy was utilized by the requesting physician.  No radiographic  interpretation.        IMPRESSION: Synchronous right stage III-a, left stage I-A non-small cell lung cancer.  The patient would be a good candidate for definitive treatment with a combination of radiation and radiosensitizing chemotherapy.  Given the very early presentation of the lesion within the left lung, this will be followed but would be amenable to stereotactic body radiotherapy in the future should this become  more of an issue.  Patient has lost a significant amount of weight and is somewhat emaciated. He therefore may not tolerate the treatments very well but an attempt at definitive therapy will be undertaken. He will need nutritional support and social work support given his overall social situation.  PLAN: Simulation and planning early next week with treatments to begin the week of October 28. Anticipate approximately 6-1/2 weeks of radiation therapy as part of his overall management      ------------------------------------------------    Billie Lade, PhD, MD

## 2012-01-19 NOTE — Progress Notes (Signed)
Spoke with pt at [facility] today.  Educational/Resource information given and explained.  Questions and concerns addressed

## 2012-01-20 ENCOUNTER — Telehealth: Payer: Self-pay | Admitting: *Deleted

## 2012-01-20 ENCOUNTER — Telehealth: Payer: Self-pay | Admitting: Internal Medicine

## 2012-01-20 NOTE — Telephone Encounter (Signed)
h# busy x sev. hrs,cell #d/c,called wm pendergrass at [phone] and he states that he was just w/ him but that he went to the store.  he took my # and stateshe will have him c/b when he returns.

## 2012-01-20 NOTE — Telephone Encounter (Signed)
Per staff message and POF I have scheduled appts. JMw 

## 2012-01-21 MED ORDER — PROCHLORPERAZINE MALEATE 10 MG PO TABS: 10.0000 mg | ORAL_TABLET | Freq: Four times a day (QID) | ORAL | Status: DC | PRN

## 2012-01-21 NOTE — Patient Instructions (Signed)
You have synchronous tumor presenting as a stage IIIa in the right lower lobe and a stage IA in the lower lobe. Will consider him for a course of concurrent chemotherapy and radiation

## 2012-01-23 ENCOUNTER — Telehealth: Payer: Self-pay | Admitting: Internal Medicine

## 2012-01-23 NOTE — Telephone Encounter (Signed)
phone still busy/aom

## 2012-01-23 NOTE — Telephone Encounter (Signed)
was able to get a new/correct  home # which is above,pt has class on 10/24 and pt is aware.

## 2012-01-24 ENCOUNTER — Encounter: Payer: Self-pay | Admitting: Internal Medicine

## 2012-01-24 NOTE — Progress Notes (Signed)
Spoke w/ Lauren regarding pt needing financial assistance.  I talked w/ pt and he is aware that he has Medicare and Medicaid.  I talked with Ms. Vear Clock (case worker) and his Medicaid pays for his Medicare premium only.  She sd he makes well over to qualify for full Medicaid.  She sd he's in the best program to benefit his needs right now.  If he switches over to the deductible plan he will have to pay $4830 every 6 months.  He will get reevaluated in Dec 2013 but more than likely he still won't qualify for full Medicaid because his income will increase.  I will give pt an EPP application when he comes in on 01/26/12 for his chemo class. I also requested that pt bring in his SSI letter so we can process the application when he's here.

## 2012-01-26 ENCOUNTER — Telehealth: Payer: Self-pay | Admitting: *Deleted

## 2012-01-26 ENCOUNTER — Ambulatory Visit
Admission: RE | Admit: 2012-01-26 | Discharge: 2012-01-26 | Disposition: A | Discharge status: Home / Self Care | Payer: Medicare Other | Source: Ambulatory Visit | Attending: Radiation Oncology | Admitting: Radiation Oncology

## 2012-01-26 ENCOUNTER — Other Ambulatory Visit: Payer: Medicare Other

## 2012-01-26 ENCOUNTER — Ambulatory Visit: Payer: Medicare Other | Admitting: Radiation Oncology

## 2012-01-26 ENCOUNTER — Encounter: Payer: Self-pay | Admitting: *Deleted

## 2012-01-26 DIAGNOSIS — Z79899 Other long term (current) drug therapy: Secondary | ICD-10-CM | POA: Insufficient documentation

## 2012-01-26 DIAGNOSIS — R1310 Dysphagia, unspecified: Secondary | ICD-10-CM | POA: Insufficient documentation

## 2012-01-26 DIAGNOSIS — M549 Dorsalgia, unspecified: Secondary | ICD-10-CM | POA: Insufficient documentation

## 2012-01-26 DIAGNOSIS — C3490 Malignant neoplasm of unspecified part of unspecified bronchus or lung: Secondary | ICD-10-CM | POA: Insufficient documentation

## 2012-01-26 DIAGNOSIS — R29898 Other symptoms and signs involving the musculoskeletal system: Secondary | ICD-10-CM | POA: Insufficient documentation

## 2012-01-26 DIAGNOSIS — Z7902 Long term (current) use of antithrombotics/antiplatelets: Secondary | ICD-10-CM | POA: Insufficient documentation

## 2012-01-26 DIAGNOSIS — R51 Headache: Secondary | ICD-10-CM | POA: Insufficient documentation

## 2012-01-26 DIAGNOSIS — Z510 Encounter for antineoplastic radiation therapy: Secondary | ICD-10-CM | POA: Insufficient documentation

## 2012-01-26 DIAGNOSIS — I69998 Other sequelae following unspecified cerebrovascular disease: Secondary | ICD-10-CM | POA: Insufficient documentation

## 2012-01-26 DIAGNOSIS — B370 Candidal stomatitis: Secondary | ICD-10-CM | POA: Insufficient documentation

## 2012-01-26 NOTE — Telephone Encounter (Signed)
Left message for pt. To call back to reschedule chemo class.

## 2012-01-27 ENCOUNTER — Telehealth: Payer: Self-pay | Admitting: Internal Medicine

## 2012-01-27 NOTE — Telephone Encounter (Signed)
s/w pt and he is aware of his nut appt on 10/31   aom

## 2012-01-30 ENCOUNTER — Other Ambulatory Visit (HOSPITAL_BASED_OUTPATIENT_CLINIC_OR_DEPARTMENT_OTHER): Payer: Medicare Other | Admitting: Lab

## 2012-01-30 ENCOUNTER — Ambulatory Visit (HOSPITAL_BASED_OUTPATIENT_CLINIC_OR_DEPARTMENT_OTHER): Payer: Medicare Other

## 2012-01-30 ENCOUNTER — Other Ambulatory Visit: Payer: Self-pay | Admitting: Internal Medicine

## 2012-01-30 VITALS — BP 128/87 | HR 55 | Temp 97.7°F | Resp 18

## 2012-01-30 DIAGNOSIS — C3490 Malignant neoplasm of unspecified part of unspecified bronchus or lung: Secondary | ICD-10-CM

## 2012-01-30 DIAGNOSIS — C3430 Malignant neoplasm of lower lobe, unspecified bronchus or lung: Secondary | ICD-10-CM

## 2012-01-30 DIAGNOSIS — Z5111 Encounter for antineoplastic chemotherapy: Secondary | ICD-10-CM

## 2012-01-30 LAB — CBC WITH DIFFERENTIAL/PLATELET
BASO%: 2.5 % — ABNORMAL HIGH (ref 0.0–2.0)
Basophils Absolute: 0.1 10*3/uL (ref 0.0–0.1)
HCT: 39.7 % (ref 38.4–49.9)
HGB: 13.9 g/dL (ref 13.0–17.1)
LYMPH%: 35.1 % (ref 14.0–49.0)
MCH: 31.4 pg (ref 27.2–33.4)
MCHC: 35 g/dL (ref 32.0–36.0)
MONO#: 0.3 10*3/uL (ref 0.1–0.9)
NEUT%: 50.3 % (ref 39.0–75.0)
Platelets: 226 10*3/uL (ref 140–400)
WBC: 4.8 10*3/uL (ref 4.0–10.3)
lymph#: 1.7 10*3/uL (ref 0.9–3.3)

## 2012-01-30 LAB — COMPREHENSIVE METABOLIC PANEL (CC13)
Albumin: 4.2 g/dL (ref 3.5–5.0)
BUN: 12 mg/dL (ref 7.0–26.0)
Calcium: 10.4 mg/dL (ref 8.4–10.4)
Chloride: 101 mEq/L (ref 98–107)
Creatinine: 1.3 mg/dL (ref 0.7–1.3)
Glucose: 91 mg/dl (ref 70–99)
Potassium: 3.3 mEq/L — ABNORMAL LOW (ref 3.5–5.1)

## 2012-01-30 MED ORDER — DIPHENHYDRAMINE HCL 50 MG/ML IJ SOLN
50.0000 mg | Freq: Once | INTRAMUSCULAR | Status: AC
  Administered 2012-01-30: 50 mg via INTRAVENOUS

## 2012-01-30 MED ORDER — PACLITAXEL CHEMO INJECTION 300 MG/50ML
45.0000 mg/m2 | Freq: Once | INTRAVENOUS | Status: AC
  Administered 2012-01-30: 72 mg via INTRAVENOUS
  Filled 2012-01-30: qty 12

## 2012-01-30 MED ORDER — FAMOTIDINE IN NACL 20-0.9 MG/50ML-% IV SOLN
20.0000 mg | Freq: Once | INTRAVENOUS | Status: AC
  Administered 2012-01-30: 20 mg via INTRAVENOUS

## 2012-01-30 MED ORDER — ONDANSETRON 16 MG/50ML IVPB (CHCC)
16.0000 mg | Freq: Once | INTRAVENOUS | Status: AC
  Administered 2012-01-30: 16 mg via INTRAVENOUS

## 2012-01-30 MED ORDER — SODIUM CHLORIDE 0.9 % IV SOLN
Freq: Once | INTRAVENOUS | Status: AC
  Administered 2012-01-30: 11:00:00 via INTRAVENOUS

## 2012-01-30 MED ORDER — SODIUM CHLORIDE 0.9 % IV SOLN
143.6000 mg | Freq: Once | INTRAVENOUS | Status: AC
  Administered 2012-01-30: 140 mg via INTRAVENOUS
  Filled 2012-01-30: qty 14

## 2012-01-30 MED ORDER — DEXAMETHASONE SODIUM PHOSPHATE 4 MG/ML IJ SOLN
20.0000 mg | Freq: Once | INTRAMUSCULAR | Status: AC
  Administered 2012-01-30: 20 mg via INTRAVENOUS

## 2012-01-30 NOTE — Progress Notes (Signed)
  Radiation Oncology         (336) [phone] ________________________________  Name: Benjamin Moss MRN: [id number]  Date: 01/26/2012  DOB: 1945/09/15  SIMULATION AND TREATMENT PLANNING NOTE  DIAGNOSIS:  Stage IIIa non-small cell lung cancer  NARRATIVE:  The patient was brought to the CT Simulation planning suite.  Identity was confirmed.  All relevant records and images related to the planned course of therapy were reviewed.  The patient freely provided informed written consent to proceed with treatment after reviewing the details related to the planned course of therapy. The consent form was witnessed and verified by the simulation staff.  Then, the patient was set-up in a stable reproducible  supine position for radiation therapy.  CT images were obtained.  Surface markings were placed.  The CT images were loaded into the planning software.  Then the target and avoidance structures were contoured.  Treatment planning then occurred.  The radiation prescription was entered and confirmed.  A total of 0 complex treatment devices were fabricated. I have requested : Intensity Modulated Radiotherapy (IMRT) is medically necessary for this case for the following reason:  Spinal cord sparing in light of the  location of the primary tumor (paraspinal) and contralateral mediastinal node involvement.  I have ordered:Nutrition Consult  PLAN:  The patient will receive 63.0 Gy in 35 fractions.  ________________________________   Billie Lade, PhD, MD

## 2012-01-30 NOTE — Patient Instructions (Addendum)
[facility] Discharge Instructions for Patients Receiving Chemotherapy  Today you received the following chemotherapy agents taxol/ carboplatin  To help prevent nausea and vomiting after your treatment, we encourage you to take your nausea medication as needed   If you develop nausea and vomiting that is not controlled by your nausea medication, call the clinic. If it is after clinic hours your family physician or the after hours number for the clinic or go to the Emergency Department.   BELOW ARE SYMPTOMS THAT SHOULD BE REPORTED IMMEDIATELY:  *FEVER GREATER THAN 100.5 F  *CHILLS WITH OR WITHOUT FEVER  NAUSEA AND VOMITING THAT IS NOT CONTROLLED WITH YOUR NAUSEA MEDICATION  *UNUSUAL SHORTNESS OF BREATH  *UNUSUAL BRUISING OR BLEEDING  TENDERNESS IN MOUTH AND THROAT WITH OR WITHOUT PRESENCE OF ULCERS  *URINARY PROBLEMS  *BOWEL PROBLEMS  UNUSUAL RASH Items with * indicate a potential emergency and should be followed up as soon as possible.  One of the nurses will contact you 24 hours after your treatment. Please let the nurse know about any problems that you may have experienced. Feel free to call the clinic you have any questions or concerns. The clinic phone number is [phone].

## 2012-01-31 ENCOUNTER — Telehealth: Payer: Self-pay | Admitting: Internal Medicine

## 2012-01-31 ENCOUNTER — Other Ambulatory Visit: Payer: Self-pay | Admitting: *Deleted

## 2012-01-31 ENCOUNTER — Telehealth: Payer: Self-pay | Admitting: *Deleted

## 2012-01-31 DIAGNOSIS — C3490 Malignant neoplasm of unspecified part of unspecified bronchus or lung: Secondary | ICD-10-CM

## 2012-01-31 MED ORDER — LIDOCAINE-PRILOCAINE 2.5-2.5 % EX CREA: TOPICAL_CREAM | CUTANEOUS | Status: DC | PRN

## 2012-01-31 NOTE — Telephone Encounter (Signed)
Benjamin Moss is doing well.  Denies n/v and reports no side effects.  Bowels and bladder moving well.  Says the "less I knows the better" and left his handouts on the bus.  Instructed to get a second copy on Monday.  He asked about next appointments.  Appointments given through the next week.  Encouraged also to call if any changes.

## 2012-01-31 NOTE — Progress Notes (Signed)
Pt has very poor venous access, pt would like a port.  Per Dr Donnald Garre okay to get a port.  Orders processed, called and informed pt that IR will be calling with an appt.  EMLA cream called into local pharmacy.  Instructions given to pt regarding EMLA cream.  Pt verbalized understanding.  Asked pt to repeat back instructions regarding EMLA cream.  SLJ

## 2012-01-31 NOTE — Telephone Encounter (Signed)
S/w tina and she has the order for the port and will call the pt with the appt.

## 2012-01-31 NOTE — Telephone Encounter (Signed)
Message copied by Augusto Garbe on Tue Jan 31, 2012  3:37 PM ------      Message from: Caren Griffins      Created: Mon Jan 30, 2012  2:12 PM      Regarding: chemo f/u       1st taxol/ Ledell Noss, Dr Donnald Garre

## 2012-02-01 ENCOUNTER — Ambulatory Visit: Payer: Medicare Other

## 2012-02-01 ENCOUNTER — Telehealth: Payer: Self-pay | Admitting: Medical Oncology

## 2012-02-01 NOTE — Telephone Encounter (Signed)
Erroneous encounter

## 2012-02-02 ENCOUNTER — Ambulatory Visit: Payer: Medicare Other | Admitting: Nutrition

## 2012-02-02 ENCOUNTER — Other Ambulatory Visit: Payer: Self-pay | Admitting: Radiology

## 2012-02-02 ENCOUNTER — Ambulatory Visit: Payer: Medicare Other

## 2012-02-02 NOTE — Progress Notes (Signed)
Patient is a 66 year old male diagnosed with lung cancer.  History includes hyperlipidemia, hypertension, COPD and stroke.  Medications include Compazine, Plavix, and Pravachol.  Labs include glucose of 105 and BUN 29 on October 14.  Height: 66 inches. Weight: 112 pounds October 17. Usual body weight: 140 pounds March of 2009. Weight was documented as 127.5 pounds in March of 2012. BMI: 18.09  Patient presents to nutrition appointment alone. He reports he has a good appetite. He does report nausea and vomiting after breakfast. He states that he usually returns to eating after he is done vomiting. He has not taken nausea medication yet because he has not been able to pick it up from the pharmacy. He reports that he plans to pick up his nausea medicine today. He denies vomiting the remainder of the day. He has poor dentition.  Nutrition diagnosis:  Inadequate oral intake related to lung cancer diagnosis and associated treatments as evidenced by BMI of 18.09 and progressive weight loss from usual body weight.  Intervention: I educated patient on strategies for increased oral intake to include small frequent meals and snacks throughout the day. I've educated him on foods that are high in calories and protein. I've also educated him on strategies for eating with nausea. I have encouraged him to take his nausea medications as prescribed. I've also provided him with one complimentary case of Ensure Plus to take with him today. I've given him fact sheets and coupons.   Monitoring, evaluation, goals: Patient will tolerate increased oral intake to minimize any further weight loss.  Next visit: Monday, November 11, during chemotherapy.

## 2012-02-03 ENCOUNTER — Ambulatory Visit: Payer: Medicare Other

## 2012-02-06 ENCOUNTER — Ambulatory Visit (HOSPITAL_BASED_OUTPATIENT_CLINIC_OR_DEPARTMENT_OTHER): Payer: Medicare Other | Admitting: Internal Medicine

## 2012-02-06 ENCOUNTER — Ambulatory Visit (HOSPITAL_BASED_OUTPATIENT_CLINIC_OR_DEPARTMENT_OTHER): Payer: Medicare Other

## 2012-02-06 ENCOUNTER — Telehealth: Payer: Self-pay | Admitting: Internal Medicine

## 2012-02-06 ENCOUNTER — Encounter: Payer: Self-pay | Admitting: Radiation Oncology

## 2012-02-06 ENCOUNTER — Other Ambulatory Visit: Payer: Self-pay | Admitting: Internal Medicine

## 2012-02-06 ENCOUNTER — Other Ambulatory Visit: Payer: Medicare Other | Admitting: Lab

## 2012-02-06 ENCOUNTER — Telehealth: Payer: Self-pay | Admitting: Medical Oncology

## 2012-02-06 ENCOUNTER — Ambulatory Visit (HOSPITAL_COMMUNITY)
Admission: RE | Admit: 2012-02-06 | Discharge: 2012-02-06 | Disposition: A | Discharge status: Home / Self Care | Payer: Medicare Other | Source: Ambulatory Visit | Attending: Internal Medicine | Admitting: Internal Medicine

## 2012-02-06 ENCOUNTER — Encounter: Payer: Self-pay | Admitting: *Deleted

## 2012-02-06 ENCOUNTER — Encounter (HOSPITAL_COMMUNITY): Payer: Self-pay

## 2012-02-06 ENCOUNTER — Ambulatory Visit: Payer: Medicare Other

## 2012-02-06 ENCOUNTER — Ambulatory Visit
Admission: RE | Admit: 2012-02-06 | Discharge: 2012-02-06 | Disposition: A | Discharge status: Home / Self Care | Payer: Medicare Other | Source: Ambulatory Visit | Attending: Radiation Oncology | Admitting: Radiation Oncology

## 2012-02-06 ENCOUNTER — Other Ambulatory Visit: Payer: Self-pay | Admitting: Medical Oncology

## 2012-02-06 VITALS — BP 118/84 | HR 76 | Temp 97.2°F | Wt 115.0 lb

## 2012-02-06 VITALS — BP 115/79 | HR 76 | Temp 97.0°F | Resp 18 | Ht 66.0 in | Wt 115.6 lb

## 2012-02-06 DIAGNOSIS — I10 Essential (primary) hypertension: Secondary | ICD-10-CM | POA: Insufficient documentation

## 2012-02-06 DIAGNOSIS — C3490 Malignant neoplasm of unspecified part of unspecified bronchus or lung: Secondary | ICD-10-CM

## 2012-02-06 DIAGNOSIS — Z7902 Long term (current) use of antithrombotics/antiplatelets: Secondary | ICD-10-CM | POA: Insufficient documentation

## 2012-02-06 DIAGNOSIS — E785 Hyperlipidemia, unspecified: Secondary | ICD-10-CM | POA: Insufficient documentation

## 2012-02-06 DIAGNOSIS — Z5189 Encounter for other specified aftercare: Secondary | ICD-10-CM | POA: Insufficient documentation

## 2012-02-06 DIAGNOSIS — C3430 Malignant neoplasm of lower lobe, unspecified bronchus or lung: Secondary | ICD-10-CM

## 2012-02-06 DIAGNOSIS — R29898 Other symptoms and signs involving the musculoskeletal system: Secondary | ICD-10-CM | POA: Insufficient documentation

## 2012-02-06 DIAGNOSIS — J4489 Other specified chronic obstructive pulmonary disease: Secondary | ICD-10-CM | POA: Insufficient documentation

## 2012-02-06 DIAGNOSIS — I69998 Other sequelae following unspecified cerebrovascular disease: Secondary | ICD-10-CM | POA: Insufficient documentation

## 2012-02-06 DIAGNOSIS — J449 Chronic obstructive pulmonary disease, unspecified: Secondary | ICD-10-CM | POA: Insufficient documentation

## 2012-02-06 DIAGNOSIS — Z5111 Encounter for antineoplastic chemotherapy: Secondary | ICD-10-CM

## 2012-02-06 LAB — BASIC METABOLIC PANEL
BUN: 30 mg/dL — ABNORMAL HIGH (ref 6–23)
Calcium: 10.5 mg/dL (ref 8.4–10.5)
Creatinine, Ser: 1.19 mg/dL (ref 0.50–1.35)
GFR calc non Af Amer: 62 mL/min — ABNORMAL LOW (ref >90)
Glucose, Bld: 92 mg/dL (ref 70–99)
Sodium: 138 mEq/L (ref 135–145)

## 2012-02-06 LAB — HEPATIC FUNCTION PANEL
ALT: 19 U/L (ref 0–53)
Albumin: 4.9 g/dL (ref 3.5–5.2)
Alkaline Phosphatase: 99 U/L (ref 39–117)
Indirect Bilirubin: 0.6 mg/dL (ref 0.3–0.9)
Total Bilirubin: 0.8 mg/dL (ref 0.3–1.2)

## 2012-02-06 LAB — CBC WITH DIFFERENTIAL/PLATELET
Eosinophils Absolute: 0.5 10*3/uL (ref 0.0–0.7)
Lymphs Abs: 1.9 10*3/uL (ref 0.7–4.0)
MCH: 32.4 pg (ref 26.0–34.0)
MCHC: 35.8 g/dL (ref 30.0–36.0)
Monocytes Absolute: 0.4 10*3/uL (ref 0.1–1.0)
Neutrophils Relative %: 57 % (ref 43–77)
Platelets: 266 10*3/uL (ref 150–400)

## 2012-02-06 MED ORDER — FAMOTIDINE IN NACL 20-0.9 MG/50ML-% IV SOLN
20.0000 mg | Freq: Once | INTRAVENOUS | Status: AC
  Administered 2012-02-06: 20 mg via INTRAVENOUS

## 2012-02-06 MED ORDER — MIDAZOLAM HCL 2 MG/2ML IJ SOLN
INTRAMUSCULAR | Status: AC | PRN
  Administered 2012-02-06 (×2): 0.5 mg via INTRAVENOUS

## 2012-02-06 MED ORDER — FENTANYL CITRATE 0.05 MG/ML IJ SOLN
INTRAMUSCULAR | Status: AC | PRN
  Administered 2012-02-06 (×2): 50 ug via INTRAVENOUS

## 2012-02-06 MED ORDER — DIPHENHYDRAMINE HCL 50 MG/ML IJ SOLN
50.0000 mg | Freq: Once | INTRAMUSCULAR | Status: AC
  Administered 2012-02-06: 50 mg via INTRAVENOUS

## 2012-02-06 MED ORDER — PACLITAXEL CHEMO INJECTION 300 MG/50ML
45.0000 mg/m2 | Freq: Once | INTRAVENOUS | Status: AC
  Administered 2012-02-06: 72 mg via INTRAVENOUS
  Filled 2012-02-06: qty 12

## 2012-02-06 MED ORDER — ALBUTEROL SULFATE HFA 108 (90 BASE) MCG/ACT IN AERS: 2.0000 | INHALATION_SPRAY | Freq: Four times a day (QID) | RESPIRATORY_TRACT | Status: DC | PRN

## 2012-02-06 MED ORDER — ONDANSETRON 16 MG/50ML IVPB (CHCC)
16.0000 mg | Freq: Once | INTRAVENOUS | Status: AC
  Administered 2012-02-06: 16 mg via INTRAVENOUS

## 2012-02-06 MED ORDER — HEPARIN SOD (PORK) LOCK FLUSH 100 UNIT/ML IV SOLN
500.0000 [IU] | Freq: Once | INTRAVENOUS | Status: DC | PRN
  Filled 2012-02-06: qty 5

## 2012-02-06 MED ORDER — DEXAMETHASONE SODIUM PHOSPHATE 4 MG/ML IJ SOLN
20.0000 mg | Freq: Once | INTRAMUSCULAR | Status: AC
  Administered 2012-02-06: 20 mg via INTRAVENOUS

## 2012-02-06 MED ORDER — FENTANYL CITRATE 0.05 MG/ML IJ SOLN
INTRAMUSCULAR | Status: AC
  Filled 2012-02-06: qty 6

## 2012-02-06 MED ORDER — SODIUM CHLORIDE 0.9 % IJ SOLN
10.0000 mL | INTRAMUSCULAR | Status: DC | PRN
  Filled 2012-02-06: qty 10

## 2012-02-06 MED ORDER — SODIUM CHLORIDE 0.9 % IV SOLN
INTRAVENOUS | Status: DC
  Administered 2012-02-06: 10:00:00 via INTRAVENOUS

## 2012-02-06 MED ORDER — SODIUM CHLORIDE 0.9 % IV SOLN
143.6000 mg | Freq: Once | INTRAVENOUS | Status: AC
  Administered 2012-02-06: 140 mg via INTRAVENOUS
  Filled 2012-02-06: qty 14

## 2012-02-06 MED ORDER — MIDAZOLAM HCL 2 MG/2ML IJ SOLN
INTRAMUSCULAR | Status: AC
  Filled 2012-02-06: qty 6

## 2012-02-06 MED ORDER — CEFAZOLIN SODIUM 1-5 GM-% IV SOLN
1.0000 g | Freq: Once | INTRAVENOUS | Status: DC
  Filled 2012-02-06: qty 50

## 2012-02-06 MED ORDER — SODIUM CHLORIDE 0.9 % IV SOLN
Freq: Once | INTRAVENOUS | Status: AC
  Administered 2012-02-06: 13:00:00 via INTRAVENOUS

## 2012-02-06 MED ORDER — HYDROCODONE-ACETAMINOPHEN 10-325 MG PO TABS: 1.0000 | ORAL_TABLET | Freq: Four times a day (QID) | ORAL | Status: DC | PRN

## 2012-02-06 NOTE — Telephone Encounter (Signed)
Requests pain refill and albuterol inhaler refill- Refilled both per Dr Arbutus Ped. Pt told he will need to get further inhaler refill from PCP.

## 2012-02-06 NOTE — H&P (Signed)
Chief Complaint: "I'm here for a portacath" Referring Physician:Mohamed HPI: Benjamin Moss MCCLAIN is an 66 y.o. male with lung cancer who is referred for port placement. He was supposed to hold his Plavix for 5 days but actually states he ran out and has not gotten a refill in several months. He offers no other c/o today. He is also scheduled for chemotherapy later today.  Past Medical History:  Past Medical History  Diagnosis Date  . Hyperlipidemia   . Hypertension   . COPD (chronic obstructive pulmonary disease)   . Stroke     left side weakness, ambulates with cane/ walker  . Right lower lobe lung mass   . Hypoxia   . Nodule of left lung   . Lung cancer 01/19/2012    Past Surgical History:  Past Surgical History  Procedure Date  . Tonsillectomy     Family History: No family history on file.  Social History:  reports that he has been smoking Cigarettes.  He has a 100 pack-year smoking history. He has never used smokeless tobacco. He reports that he does not drink alcohol or use illicit drugs.  Allergies: No Known Allergies  Medications: albuterol (PROVENTIL HFA;VENTOLIN HFA) 108 (90 BASE) MCG/ACT inhaler Sig - Route: Inhale 2 puffs into the lungs every 6 (six) hours as needed. For shortness of breath - Inhalation Class: Historical Med amLODipine (NORVASC) 10 MG tablet 12/31/2011 Sig - Route: Take 10 mg by mouth daily. - Oral Class: Historical Med Number of times this order has been changed since signing: hydrochlorothiazide (HYDRODIURIL) 25 MG tablet 12/31/2011 Sig - Route: Take 25 mg by mouth daily. - Oral Class: Historical Med Number of times this order has been changed since signing: 3 [address] HYDROcodone-acetaminophen (NORCO) 10-325 MG per tablet 20 tablet 0 01/17/2012 Sig - Route: Take 1 tablet by mouth every 6 (six) hours as needed for pain. - Oral Class: Print lisinopril (PRINIVIL,ZESTRIL) 10 MG tablet 12/31/2011 Sig - Route: Take 10 mg by mouth daily. - Oral Class:  Historical Med Number of times this order has been changed since signing: 3 Order [address] pravastatin (PRAVACHOL) 80 MG tablet 12/31/2011 Sig - Route: Take 80 mg by mouth daily. - Oral Class: Historical Med Number of times this order has been changed since signing: 3 Order [address] prochlorperazine (COMPAZINE) 10 MG tablet 60 tablet 0 01/21/2012 01/28/2012 Sig - Route: Take 1 tablet (10 mg total) by mouth every 6 (six) hours as needed   Please HPI for pertinent positives, otherwise complete 10 system ROS negative.  Physical Exam: Blood pressure 139/95, pulse 75, temperature 97.8 F (36.6 C), temperature source Oral, resp. rate 18, height 5\' 6"  (1.676 m), weight 112 lb (50.803 kg), SpO2 87.00%. Body mass index is 18.08 kg/(m^2).   General Appearance:  Alert, cooperative, no distress, appears stated age, thin  Head:  Normocephalic, without obvious abnormality, atraumatic  ENT: Unremarkable  Neck: Supple, symmetrical, trachea midline, no adenopathy, thyroid: not enlarged, symmetric, no tenderness/mass/nodules  Lungs:   Clear to auscultation bilaterally, few wheezes on right  Chest Wall:  No tenderness or deformity  Heart:  Regular rate and rhythm, S1, S2 normal, no murmur, rub or gallop. Carotids 2+ without bruit.  Abdomen:   Soft, non-tender, non distended. Bowel sounds active all four quadrants,  no masses, no organomegaly.  Neurologic: Normal affect, no gross deficits.   Results for orders placed during the hospital encounter of 02/06/12 (from the past 48 hour(s))  APTT  Status: Normal   Collection Time   02/06/12  7:50 AM      Component Value Range Comment   aPTT 32  24 - 37 seconds   CBC WITH DIFFERENTIAL     Status: Normal (Preliminary result)   Collection Time   02/06/12  7:50 AM      Component Value Range Comment   WBC 6.7  4.0 - 10.5 K/uL    RBC 4.75  4.22 - 5.81 MIL/uL    Hemoglobin 15.4  13.0 - 17.0 g/dL    HCT 16.1  09.6 - 04.5 %    MCV 90.5  78.0 - 100.0 fL    MCH  32.4  26.0 - 34.0 pg    MCHC 35.8  30.0 - 36.0 g/dL    RDW 40.9  81.1 - 91.4 %    Platelets 266  150 - 400 K/uL    Neutrophils Relative PENDING  43 - 77 %    Neutro Abs PENDING  1.7 - 7.7 K/uL    Band Neutrophils PENDING  0 - 10 %    Lymphocytes Relative PENDING  12 - 46 %    Lymphs Abs PENDING  0.7 - 4.0 K/uL    Monocytes Relative PENDING  3 - 12 %    Monocytes Absolute PENDING  0.1 - 1.0 K/uL    Eosinophils Relative PENDING  0 - 5 %    Eosinophils Absolute PENDING  0.0 - 0.7 K/uL    Basophils Relative PENDING  0 - 1 %    Basophils Absolute PENDING  0.0 - 0.1 K/uL    WBC Morphology PENDING      RBC Morphology PENDING      Smear Review PENDING      nRBC PENDING  0 /100 WBC    Metamyelocytes Relative PENDING      Myelocytes PENDING      Promyelocytes Absolute PENDING      Blasts PENDING     PROTIME-INR     Status: Normal   Collection Time   02/06/12  7:50 AM      Component Value Range Comment   Prothrombin Time 12.6  11.6 - 15.2 seconds    INR 0.95  0.00 - 1.49    No results found.  Assessment/Plan Lung cancer Discussed port placement procedure, including risks. His COPD baseline O2 sats in the high 80s, low 90s, so will try sedation with Fentanyl and local only. Labs ok COnsent signed in chart  Brayton El PA-C 02/06/2012, 8:22 AM

## 2012-02-06 NOTE — Telephone Encounter (Signed)
Infusion nurse calls to report pt is SOB -O2 sat at rest = 83%. Oxygen 2 liter nasal cannula applied and 60 minutes later o2 sat=88% at rest. I will talk to Dr Arbutus Ped about home O2-pt has COPD. Per Dr Arbutus Ped okay to order oxygen 2-3 nasal cannula for comfort prn. Faxed note and order to [facility]

## 2012-02-06 NOTE — Telephone Encounter (Signed)
Called and l/m with appt for 11/18 and for him to stop by and get a sch.    anne

## 2012-02-06 NOTE — Progress Notes (Addendum)
[facility] [phone]   [phone]  OFFICE PROGRESS NOTE  Julieanne Manson, MD [address] [place] [address]  DIAGNOSIS: non-small cell lung cancer, squamous cell carcinoma presenting with synchronous tumor as stage IIIA (T2a., N2, M0) in the right lung and questionably stage IA (T1a., N0, M0) in the left lung based on the imaging studies also the final pathology did not show metastatic squamous cell carcinoma in the mediastinal lymph nodes or the left lower lobe mass   PRIOR THERAPY: None  CURRENT THERAPY: Concurrent chemoradiation with weekly carboplatin for AUC of 2 and paclitaxel 45 mg/M2. He is status post 1 cycle.  INTERVAL HISTORY: Benjamin Moss 66 y.o. male returns to the clinic today for followup visit. The patient tolerated the first cycle of his systemic chemotherapy with carboplatin and paclitaxel well with concurrent radiation. The patient denied having any significant nausea or vomiting, no fever or chills. He denied having any significant chest pain, shortness breath, cough or hemoptysis. No significant peripheral neuropathy. He had a Port-A-Cath placed by IR earlier today.  MEDICAL HISTORY: Past Medical History  Diagnosis Date  . Hyperlipidemia   . Hypertension   . COPD (chronic obstructive pulmonary disease)   . Stroke     left side weakness, ambulates with cane/ walker  . Right lower lobe lung mass   . Hypoxia   . Nodule of left lung   . Lung cancer 01/19/2012    ALLERGIES:   has no known allergies.  MEDICATIONS:  Current Outpatient Prescriptions  Medication Sig Dispense Refill  . albuterol (PROVENTIL HFA;VENTOLIN HFA) 108 (90 BASE) MCG/ACT inhaler Inhale 2 puffs into the lungs every 6 (six) hours as needed. For shortness of breath      . amLODipine (NORVASC) 10 MG tablet Take 10 mg by mouth daily.       . clopidogrel (PLAVIX) 75 MG tablet Take 75 mg by mouth daily.       . hydrochlorothiazide (HYDRODIURIL)  25 MG tablet Take 25 mg by mouth daily.       Marland Kitchen lidocaine-prilocaine (EMLA) cream Apply topically as needed. Apply a small amount to the port 1-2 hrs before chemo.  Cover with clear dsg to keep cream from rubbing off.  30 g  0  . lisinopril (PRINIVIL,ZESTRIL) 10 MG tablet Take 10 mg by mouth daily.       Marland Kitchen HYDROcodone-acetaminophen (NORCO) 10-325 MG per tablet Take 1 tablet by mouth every 6 (six) hours as needed for pain.  20 tablet  0  . pravastatin (PRAVACHOL) 80 MG tablet Take 80 mg by mouth daily.       . prochlorperazine (COMPAZINE) 10 MG tablet Take 1 tablet (10 mg total) by mouth every 6 (six) hours as needed.  60 tablet  0   No current facility-administered medications for this visit.   Facility-Administered Medications Ordered in Other Visits  Medication Dose Route Frequency Provider Last Rate Last Dose  . 0.9 %  sodium chloride infusion   Intravenous Continuous Brayton El, PA 20 mL/hr at 02/06/12 0930    . ceFAZolin (ANCEF) IVPB 1 g/50 mL premix  1 g Intravenous Once [employer], PA      . fentaNYL (SUBLIMAZE) 0.05 MG/ML injection           . [COMPLETED] fentaNYL (SUBLIMAZE) injection   Intravenous PRN Simonne Come, MD   50 mcg at 02/06/12 1007  . midazolam (VERSED) 2 MG/2ML injection           . [  COMPLETED] midazolam (VERSED) injection   Intravenous PRN Simonne Come, MD   0.5 mg at 02/06/12 1007    SURGICAL HISTORY:  Past Surgical History  Procedure Date  . Tonsillectomy     REVIEW OF SYSTEMS:  A comprehensive review of systems was negative.   PHYSICAL EXAMINATION: General appearance: alert, cooperative and no distress Head: Normocephalic, without obvious abnormality, atraumatic Neck: no adenopathy Lymph nodes: Cervical, supraclavicular, and axillary nodes normal. Resp: clear to auscultation bilaterally Cardio: regular rate and rhythm, S1, S2 normal, no murmur, click, rub or gallop GI: soft, non-tender; bowel sounds normal; no masses,  no organomegaly Extremities:  extremities normal, atraumatic, no cyanosis or edema Neurologic: Alert and oriented X 3, normal strength and tone. Normal symmetric reflexes. Normal coordination and gait  ECOG PERFORMANCE STATUS: 0 - Asymptomatic  There were no vitals taken for this visit.  LABORATORY DATA: Lab Results  Component Value Date   WBC 6.7 02/06/2012   HGB 15.4 02/06/2012   HCT 43.0 02/06/2012   MCV 90.5 02/06/2012   PLT 266 02/06/2012      Chemistry      Component Value Date/Time   NA 138 02/06/2012 0750   NA 142 01/30/2012 1029   K 3.2* 02/06/2012 0750   K 3.3* 01/30/2012 1029   CL 95* 02/06/2012 0750   CL 101 01/30/2012 1029   CO2 30 02/06/2012 0750   CO2 29 01/30/2012 1029   BUN 30* 02/06/2012 0750   BUN 12.0 01/30/2012 1029   CREATININE 1.19 02/06/2012 0750   CREATININE 1.3 01/30/2012 1029      Component Value Date/Time   CALCIUM 10.5 02/06/2012 0750   CALCIUM 10.4 01/30/2012 1029   ALKPHOS 99 02/06/2012 0750   ALKPHOS 99 01/30/2012 1029   AST 26 02/06/2012 0750   AST 22 01/30/2012 1029   ALT 19 02/06/2012 0750   ALT 18 01/30/2012 1029   BILITOT 0.8 02/06/2012 0750   BILITOT 0.40 01/30/2012 1029       RADIOGRAPHIC STUDIES: Dg Skull 1-3 Views  01/09/2012  *RADIOLOGY REPORT*  Clinical Data: Evaluate for metal in the skull.  SKULL - 1-3 VIEW  Comparison: CT head 02/08/2010.  Findings: The calvarium is intact.  There is no visible radiopaque foreign body or defect.  IMPRESSION: The patient is cleared for MRI on the basis of metallic foreign body in the skull.   Original Report Authenticated By: Elsie Stain, M.D.    Dg Chest 2 View Within Previous 72 Hours.  Films Obtained On Friday Are Acceptable For Monday And Tuesday Cases  01/16/2012  *RADIOLOGY REPORT*  Clinical Data: Preop for lung surgery and bronchoscopy.  Shortness of breath.  Chest pain.  Headache.  Cough.  CHEST - 2 VIEW  Comparison: 11/10/2037  Findings: Mass in the superior segment right lower lung medially measuring 3.8 cm diameter.   Stable appearance since previous study. Diffuse emphysematous changes and scattered fibrosis in the lungs. No blunting of costophrenic angles.  No pneumothorax.  Calcified and tortuous aorta.  Normal heart size and pulmonary vascularity. No pneumothorax.  Degenerative changes in the thoracic spine.  IMPRESSION: Stable appearance of mass in the medial superior segment right lower lung.  Diffuse emphysematous changes.   Original Report Authenticated By: Marlon Pel, M.D.    Mr Laqueta Jean Wo Contrast  01/09/2012  *RADIOLOGY REPORT*  Clinical Data: New diagnosis of lung mass.  Rule out metastatic disease.  Headache.  MRI HEAD WITHOUT AND WITH CONTRAST  Technique:  Multiplanar, multiecho  pulse sequences of the brain and surrounding structures were obtained according to standard protocol without and with intravenous contrast  Contrast: 10mL MULTIHANCE GADOBENATE DIMEGLUMINE 529 MG/ML IV SOLN  Comparison: CT 02/08/2010, MRI 02/06/2010  Findings: Chronic right parietal infarct.  Chronic ischemia throughout the cerebral white matter bilaterally.  Chronic left thalamic infarct and chronic infarct in the right internal capsule and periventricular white matter.  Brainstem and cerebellum are intact.  Negative for acute infarct.  Scattered areas of micro hemorrhage in the brain.  Chronic hemorrhagic infarct left thalamus again noted.  Findings are suggestive of chronic hypertension and micro bleed.  Postcontrast imaging reveals no enhancing lesion.  No evidence of metastatic disease to the brain.  IMPRESSION: Negative for intracranial metastatic deposits.  Chronic right parietal infarct.  Chronic ischemic changes and chronic areas of micro hemorrhage are present in the brain.   Original Report Authenticated By: Camelia Phenes, M.D.    Nm Pet Image Initial (pi) Skull Base To Thigh  01/09/2012  *RADIOLOGY REPORT*  Clinical Data: Initial treatment strategy for right lung mass.  NUCLEAR MEDICINE PET SKULL BASE TO THIGH   Fasting Blood Glucose:  104  Technique:  19.1 mCi F-18 FDG was injected intravenously. CT data was obtained and used for attenuation correction and anatomic localization only.  (This was not acquired as a diagnostic CT examination.) Additional exam technical data entered on technologist worksheet.  Comparison:  CT 12/14/2011  Findings:  Neck: No hypermetabolic lymph nodes in the neck.  Chest:  There is a hypermetabolic right lower lobe mass measuring 41 by 36 mm with intense metabolic activity ( SUV max = 56.2). Hypermetabolic precarinal lymph node with SUV max = 5.2. There is a hypermetabolic activity in the APwindow  whcih is suspicious for adenopathy. Nodes difficult to eight the difficult to define on this scan.  SUV max = 4.6.  Within the superior aspect of the left lower lobe there is an irregular 8 mm nodule which abuts the fissure with SUV max = 2.3.  Abdomen/Pelvis:  No abnormal hypermetabolic activity within the liver, pancreas, adrenal glands, or spleen.  No hypermetabolic lymph nodes in the abdomen or pelvis.  Skeleton:  No focal hypermetabolic activity to suggest skeletal metastasis.  IMPRESSION:  1.  Intensely hypermetabolic right lower lobe mass abutting the pleural surfaces consistent with primary bronchogenic carcinoma. 2.  Concern for synchronous bronchogenic carcinoma in the superior aspect of the left lower lobe. 3.  Hypermetabolic precarinal and AP window adenopathy is concerning for mediastinal metastasis.   Original Report Authenticated By: Genevive Bi, M.D.    Dg C-arm Bronchoscopy  01/16/2012  CLINICAL DATA: Right Lower Lobe Mass, Navigation Bronchoscopy   C-ARM BRONCHOSCOPY  Fluoroscopy was utilized by the requesting physician.  No radiographic  interpretation.      ASSESSMENT: This is a very pleasant 66 years old African American male with synchronous stage IIIa non-small cell lung cancer involving the right lung as questionably stage IA non-small cell lung cancer involving the  left lower lobe. The patient is currently on concurrent chemotherapy and radiation with weekly carboplatin and paclitaxel.  PLAN: He is tolerating his treatment fairly well with no significant adverse effects. I advised the patient to continue with his current treatment as planned. He will start cycle #2 of his chemotherapy today. We will give him a refill of his albuterol inhaler for now until he is able to contact his primary care physician. We will also give him a refill of his pain medication  in the form of Norco on as-needed basis. He would come back for followup visit in 2 weeks for evaluation and management any adverse effect of his chemotherapy. He was advised to call immediately if he has any concerning symptoms in the interval. He was seen earlier today by the social worker at the cancer Center for evaluation and help with his transportation to treatment.  All questions were answered. The patient knows to call the clinic with any problems, questions or concerns. We can certainly see the patient much sooner if necessary.  I spent 15 minutes counseling the patient face to face. The total time spent in the appointment was 25 minutes.

## 2012-02-06 NOTE — Patient Instructions (Signed)
You are tolerating her chemotherapy fairly well. We'll proceed with cycle #2 today Followup in 2 weeks

## 2012-02-06 NOTE — Patient Instructions (Signed)
Patient aware of next appointment; discharged to Rad Onc for treatment; taxi voucher given to patient.

## 2012-02-06 NOTE — Procedures (Signed)
Successful placement of right IJ approach port-a-cath with tip at superior aspect of the right atrium. The catheter is ready for immediate use. No immediate post procedural complications.  

## 2012-02-06 NOTE — Progress Notes (Signed)
[facility]  Clinical Social Work  Clinical Social Work met with patient in exam room today to assess for additional needs. Mr. Dijulio continues to rely on friends or taxi to get to appointments.  At this time, patient agrees to transportation assistance. Patient and CSW completed [employer] application and CSW will fax to [facility] office.  CSW also has left VM with [employer] representative to determine future interview time.  CSW will follow up with patient regarding transportation concerns.  Kathrin Penner, MSW, LCSW Clinical Social Worker [facility] [phone]

## 2012-02-06 NOTE — Progress Notes (Signed)
Unable to lie still for his treatment today because of pain in his left arm.  He received a pain prescription for Hydrocodone from Dr. Arbutus Ped and was advised by Dr. Roselind Messier to take his pain medications at least 2 hours before treatment every day and he stated understanding.

## 2012-02-07 ENCOUNTER — Ambulatory Visit: Payer: Medicare Other

## 2012-02-07 ENCOUNTER — Ambulatory Visit
Admission: RE | Admit: 2012-02-07 | Discharge: 2012-02-07 | Disposition: A | Discharge status: Home / Self Care | Payer: Medicare Other | Source: Ambulatory Visit | Attending: Radiation Oncology | Admitting: Radiation Oncology

## 2012-02-07 NOTE — Progress Notes (Signed)
Intensity modulated radiation therapy device note:  Today the patient had development of his IMRT device. The patient will be treated with helical intensity modulated radiation therapy. He will be treated with 6.5 sinogram segments. This constitutes 1 IMRT device.

## 2012-02-08 ENCOUNTER — Ambulatory Visit: Payer: Medicare Other

## 2012-02-08 ENCOUNTER — Ambulatory Visit
Admission: RE | Admit: 2012-02-08 | Discharge: 2012-02-08 | Disposition: A | Discharge status: Home / Self Care | Payer: Medicare Other | Source: Ambulatory Visit | Attending: Radiation Oncology | Admitting: Radiation Oncology

## 2012-02-08 ENCOUNTER — Ambulatory Visit
Admission: RE | Admit: 2012-02-08 | Discharge: 2012-02-08 | Payer: Medicare Other | Source: Ambulatory Visit | Attending: Radiation Oncology | Admitting: Radiation Oncology

## 2012-02-08 NOTE — Progress Notes (Signed)
Patient here for patient education for radiation treatment of chest for lung cancer.Reviewed routine of clinic and side effects of treatment to include shortness of breath, cough, fatigue and skin discoloration.Patient will need constant reinforcement of reviewing how to take care of self as had difficulty with teach back.Stated he could read so I gave Radiation Therapy and You Book and my card.Will give radiplex gel on weekly under treat visit when skin changes.Uses ivory soap which I told him was ok.Will bring medications tomorrow or Friday as not good historian of what he takes.

## 2012-02-09 ENCOUNTER — Ambulatory Visit: Payer: Medicare Other

## 2012-02-09 ENCOUNTER — Telehealth: Payer: Self-pay | Admitting: *Deleted

## 2012-02-09 ENCOUNTER — Ambulatory Visit
Admission: RE | Admit: 2012-02-09 | Discharge: 2012-02-09 | Disposition: A | Discharge status: Home / Self Care | Payer: Medicare Other | Source: Ambulatory Visit | Attending: Radiation Oncology | Admitting: Radiation Oncology

## 2012-02-09 VITALS — BP 116/84 | HR 63 | Temp 97.3°F | Wt 118.4 lb

## 2012-02-09 DIAGNOSIS — C3490 Malignant neoplasm of unspecified part of unspecified bronchus or lung: Secondary | ICD-10-CM

## 2012-02-09 NOTE — Progress Notes (Signed)
Here for weekly under treat assessment of radiation for non small cell lung cancer.Patient a little  drowsy as took pain medication prior to treatment.Pani level "4" which patient states he has had headaches since he had stroke.Denies nausea.Chemotherapy scheduled for Monday 02/13/12.

## 2012-02-09 NOTE — Progress Notes (Signed)
[facility]    Radiation Oncology [address] [place]     Maryln Gottron, M.D. [place], [place] [address]               Billie Lade, M.D., Ph.D. Phone: [phone]      Molli Hazard A. Kathrynn Running, M.D. Fax: [phone]      Radene Gunning, M.D., Ph.D.         Lurline Hare, M.D.         Grayland Jack, M.D Weekly Treatment Management Note  Name: Benjamin Moss     MRN: [id number]        CSN: [id number] Date: 02/09/2012      DOB: Sep 03, 1945  CC: Julieanne Manson, MD         [place]    Status: Outpatient  Diagnosis: The encounter diagnosis was Lung cancer.  Current Dose: 5.4 Gy  Current Fraction: 3/35  Planned Dose: 63 Gy  Narrative: Benjamin Moss was seen today for weekly treatment management. The chart was checked and MVCT  were reviewed. He is tolerating his radiation treatments well at this time.  He denies any breathing problems cough or pain within the chest. He denies any swallowing problems.  Review of patient's allergies indicates no known allergies. Current Outpatient Prescriptions  Medication Sig Dispense Refill  . albuterol (PROVENTIL HFA;VENTOLIN HFA) 108 (90 BASE) MCG/ACT inhaler Inhale 2 puffs into the lungs every 6 (six) hours as needed. For shortness of breath  1 Inhaler  0  . amLODipine (NORVASC) 10 MG tablet Take 10 mg by mouth daily.       . clopidogrel (PLAVIX) 75 MG tablet Take 75 mg by mouth daily.       . hydrochlorothiazide (HYDRODIURIL) 25 MG tablet Take 25 mg by mouth daily.       Marland Kitchen HYDROcodone-acetaminophen (NORCO) 10-325 MG per tablet Take 1 tablet by mouth every 6 (six) hours as needed for pain.  30 tablet  0  . lidocaine-prilocaine (EMLA) cream Apply topically as needed. Apply a small amount to the port 1-2 hrs before chemo.  Cover with clear dsg to keep cream from rubbing off.  30 g  0  . lisinopril (PRINIVIL,ZESTRIL) 10 MG tablet Take 10 mg by mouth daily.       . pravastatin (PRAVACHOL) 80 MG tablet Take 80 mg by mouth daily.       .  prochlorperazine (COMPAZINE) 10 MG tablet Take 1 tablet (10 mg total) by mouth every 6 (six) hours as needed.  60 tablet  0   Labs:  Lab Results  Component Value Date   WBC 6.7 02/06/2012   HGB 15.4 02/06/2012   HCT 43.0 02/06/2012   MCV 90.5 02/06/2012   PLT 266 02/06/2012   Lab Results  Component Value Date   CREATININE 1.19 02/06/2012   BUN 30* 02/06/2012   NA 138 02/06/2012   K 3.2* 02/06/2012   CL 95* 02/06/2012   CO2 30 02/06/2012   Lab Results  Component Value Date   ALT 19 02/06/2012   AST 26 02/06/2012   PHOS 3.8 02/10/2010   BILITOT 0.8 02/06/2012    Physical Examination:  weight is 118 lb 6.4 oz (53.706 kg). His temperature is 97.3 F (36.3 C). His blood pressure is 116/84 and his pulse is 63.    Wt Readings from Last 3 Encounters:  02/09/12 118 lb 6.4 oz (53.706 kg)  02/06/12 115 lb (52.164 kg)  02/06/12 115 lb 9.6 oz (52.436  kg)     Lungs - Normal respiratory effort, chest expands symmetrically. Lungs are clear to auscultation, no crackles or wheezes.  Heart has regular rhythm and rate  Abdomen is soft and non tender with normal bowel sounds The oral cavity is moist without secondary infection  Assessment:  Patient tolerating treatments well  Plan: Continue treatment per original radiation prescription

## 2012-02-09 NOTE — Telephone Encounter (Signed)
Received referral form via fax from [facility].  Form filled out, will leave on Dr Donnald Garre for signature.  SLJ

## 2012-02-10 ENCOUNTER — Ambulatory Visit: Payer: Medicare Other

## 2012-02-13 ENCOUNTER — Ambulatory Visit (HOSPITAL_BASED_OUTPATIENT_CLINIC_OR_DEPARTMENT_OTHER): Payer: Medicare Other

## 2012-02-13 ENCOUNTER — Ambulatory Visit: Payer: Medicare Other | Admitting: Nutrition

## 2012-02-13 ENCOUNTER — Ambulatory Visit: Payer: Medicare Other

## 2012-02-13 ENCOUNTER — Ambulatory Visit
Admission: RE | Admit: 2012-02-13 | Discharge: 2012-02-13 | Disposition: A | Discharge status: Home / Self Care | Payer: Medicare Other | Source: Ambulatory Visit | Attending: Radiation Oncology | Admitting: Radiation Oncology

## 2012-02-13 ENCOUNTER — Other Ambulatory Visit (HOSPITAL_BASED_OUTPATIENT_CLINIC_OR_DEPARTMENT_OTHER): Payer: Medicare Other | Admitting: Lab

## 2012-02-13 VITALS — BP 112/73 | HR 72 | Temp 97.7°F | Resp 20

## 2012-02-13 DIAGNOSIS — C3490 Malignant neoplasm of unspecified part of unspecified bronchus or lung: Secondary | ICD-10-CM

## 2012-02-13 DIAGNOSIS — Z5111 Encounter for antineoplastic chemotherapy: Secondary | ICD-10-CM

## 2012-02-13 DIAGNOSIS — C3430 Malignant neoplasm of lower lobe, unspecified bronchus or lung: Secondary | ICD-10-CM

## 2012-02-13 LAB — CBC WITH DIFFERENTIAL/PLATELET
BASO%: 1.7 % (ref 0.0–2.0)
EOS%: 8.7 % — ABNORMAL HIGH (ref 0.0–7.0)
LYMPH%: 33.6 % (ref 14.0–49.0)
MCH: 31.5 pg (ref 27.2–33.4)
MCHC: 34.6 g/dL (ref 32.0–36.0)
MONO#: 0.4 10*3/uL (ref 0.1–0.9)
NEUT%: 45.1 % (ref 39.0–75.0)
Platelets: 190 10*3/uL (ref 140–400)
RBC: 3.9 10*6/uL — ABNORMAL LOW (ref 4.20–5.82)
WBC: 4 10*3/uL (ref 4.0–10.3)
nRBC: 0 % (ref 0–0)

## 2012-02-13 LAB — COMPREHENSIVE METABOLIC PANEL (CC13)
ALT: 12 U/L (ref 0–55)
AST: 15 U/L (ref 5–34)
Alkaline Phosphatase: 79 U/L (ref 40–150)
BUN: 26 mg/dL (ref 7.0–26.0)
Calcium: 9.7 mg/dL (ref 8.4–10.4)
Chloride: 104 mEq/L (ref 98–107)
Creatinine: 1.1 mg/dL (ref 0.7–1.3)
Total Bilirubin: 0.72 mg/dL (ref 0.20–1.20)

## 2012-02-13 MED ORDER — FAMOTIDINE IN NACL 20-0.9 MG/50ML-% IV SOLN
20.0000 mg | Freq: Once | INTRAVENOUS | Status: AC
  Administered 2012-02-13: 20 mg via INTRAVENOUS

## 2012-02-13 MED ORDER — SODIUM CHLORIDE 0.9 % IV SOLN
Freq: Once | INTRAVENOUS | Status: AC
  Administered 2012-02-13: 11:00:00 via INTRAVENOUS

## 2012-02-13 MED ORDER — DEXAMETHASONE SODIUM PHOSPHATE 4 MG/ML IJ SOLN
20.0000 mg | Freq: Once | INTRAMUSCULAR | Status: AC
  Administered 2012-02-13: 20 mg via INTRAVENOUS

## 2012-02-13 MED ORDER — DIPHENHYDRAMINE HCL 50 MG/ML IJ SOLN
50.0000 mg | Freq: Once | INTRAMUSCULAR | Status: AC
Start: 2012-02-13 — End: 2012-02-13
  Administered 2012-02-13: 50 mg via INTRAVENOUS

## 2012-02-13 MED ORDER — SODIUM CHLORIDE 0.9 % IJ SOLN
10.0000 mL | INTRAMUSCULAR | Status: DC | PRN
  Administered 2012-02-13: 10 mL
  Filled 2012-02-13: qty 10

## 2012-02-13 MED ORDER — HEPARIN SOD (PORK) LOCK FLUSH 100 UNIT/ML IV SOLN
500.0000 [IU] | Freq: Once | INTRAVENOUS | Status: AC | PRN
  Administered 2012-02-13: 500 [IU]
  Filled 2012-02-13: qty 5

## 2012-02-13 MED ORDER — SODIUM CHLORIDE 0.9 % IV SOLN
143.6000 mg | Freq: Once | INTRAVENOUS | Status: AC
  Administered 2012-02-13: 140 mg via INTRAVENOUS
  Filled 2012-02-13: qty 14

## 2012-02-13 MED ORDER — ONDANSETRON 16 MG/50ML IVPB (CHCC)
16.0000 mg | Freq: Once | INTRAVENOUS | Status: AC
  Administered 2012-02-13: 16 mg via INTRAVENOUS

## 2012-02-13 MED ORDER — PACLITAXEL CHEMO INJECTION 300 MG/50ML
45.0000 mg/m2 | Freq: Once | INTRAVENOUS | Status: AC
  Administered 2012-02-13: 72 mg via INTRAVENOUS
  Filled 2012-02-13: qty 12

## 2012-02-13 NOTE — Progress Notes (Signed)
I spoke to patient today in the chemotherapy area. He denies nausea. He reports he continues to eat and has a good appetite. He is drinking for Ensure Plus daily. His weight has increased to 118.4 pounds November 7 from 112 pounds November 4.  Nutrition diagnosis: Inadequate oral intake improved.  Intervention: I educated patient to continue high-calorie, high-protein foods throughout the day along with Ensure Plus 4 times a day. I provided him with additional coupons at his request. He was encouraged to take nausea medication as required to prevent nausea.  Monitoring, evaluation, goals: Patient has been able to increase oral intake to promote weight gain.  Next visit: I am available to follow patient as needed throughout treatment. There is no official followup appointment scheduled at this time. Patient has my contact information and will call me if needed.

## 2012-02-13 NOTE — Patient Instructions (Signed)
Patient aware of next appointment; discharged home with no complaints. 

## 2012-02-14 ENCOUNTER — Ambulatory Visit: Payer: Medicare Other

## 2012-02-14 ENCOUNTER — Ambulatory Visit
Admission: RE | Admit: 2012-02-14 | Discharge: 2012-02-14 | Disposition: A | Discharge status: Home / Self Care | Payer: Medicare Other | Source: Ambulatory Visit | Attending: Radiation Oncology | Admitting: Radiation Oncology

## 2012-02-14 VITALS — BP 100/78 | HR 72 | Temp 98.3°F | Wt 118.4 lb

## 2012-02-14 DIAGNOSIS — C3490 Malignant neoplasm of unspecified part of unspecified bronchus or lung: Secondary | ICD-10-CM

## 2012-02-14 MED ORDER — HYDROCODONE-ACETAMINOPHEN 10-325 MG PO TABS: 1.0000 | ORAL_TABLET | Freq: Four times a day (QID) | ORAL | Status: DC | PRN

## 2012-02-14 NOTE — Addendum Note (Signed)
Encounter addended by: Tessa Lerner, RN on: 02/14/2012  3:12 PM<BR>     Documentation filed: Vitals Section

## 2012-02-15 ENCOUNTER — Ambulatory Visit: Payer: Medicare Other

## 2012-02-16 ENCOUNTER — Ambulatory Visit
Admission: RE | Admit: 2012-02-16 | Discharge: 2012-02-16 | Disposition: A | Discharge status: Home / Self Care | Payer: Medicare Other | Source: Ambulatory Visit | Attending: Radiation Oncology | Admitting: Radiation Oncology

## 2012-02-16 ENCOUNTER — Ambulatory Visit: Payer: Medicare Other

## 2012-02-16 ENCOUNTER — Encounter: Payer: Self-pay | Admitting: Radiation Oncology

## 2012-02-16 VITALS — BP 112/81 | HR 103 | Temp 98.6°F | Resp 20 | Wt 119.3 lb

## 2012-02-16 DIAGNOSIS — C3490 Malignant neoplasm of unspecified part of unspecified bronchus or lung: Secondary | ICD-10-CM

## 2012-02-16 NOTE — Progress Notes (Signed)
[facility]    Radiation Oncology [address] [place]     Maryln Gottron, M.D. [place], [place] [address]               Billie Lade, M.D., Ph.D. Phone: [phone]      Molli Hazard A. Kathrynn Running, M.D. Fax: [phone]      Radene Gunning, M.D., Ph.D.         Lurline Hare, M.D.         Grayland Jack, M.D Weekly Treatment Management Note  Name: Benjamin Moss     MRN: [id number]        CSN: [id number] Date: 02/16/2012      DOB: [birth year]  CC: Julieanne Manson, MD         [place]    Status: Outpatient  Diagnosis: The encounter diagnosis was Lung cancer.  Current Dose: 1080 cGy  Current Fraction: 6  Planned Dose: 6300 cGy  Narrative: [name] was seen today for weekly treatment management. The chart was checked and MVCT  were reviewed. He continues to tolerate his radiation therapy well. He denies any breathing problems or swallowing difficulties. He continues to ride the bus in for his treatments.  He did have a stroke approximately a year ago and  developed some left arm  and hand weakness and is inquiring about physical therapy for this issue.  We the patient does have medical insurance and would like to be evaluated concerning this issue.  Review of patient's allergies indicates no known allergies.  Current Outpatient Prescriptions  Medication Sig Dispense Refill  . albuterol (PROVENTIL HFA;VENTOLIN HFA) 108 (90 BASE) MCG/ACT inhaler Inhale 2 puffs into the lungs every 6 (six) hours as needed. For shortness of breath  1 Inhaler  0  . amLODipine (NORVASC) 10 MG tablet Take 10 mg by mouth daily.       . clopidogrel (PLAVIX) 75 MG tablet Take 75 mg by mouth daily.       . hydrochlorothiazide (HYDRODIURIL) 25 MG tablet Take 25 mg by mouth daily.       Marland Kitchen HYDROcodone-acetaminophen (NORCO) 10-325 MG per tablet Take 1 tablet by mouth every 6 (six) hours as needed for pain.  30 tablet  0  . lidocaine-prilocaine (EMLA) cream Apply topically as needed. Apply a small  amount to the port 1-2 hrs before chemo.  Cover with clear dsg to keep cream from rubbing off.  30 g  0  . lisinopril (PRINIVIL,ZESTRIL) 10 MG tablet Take 10 mg by mouth daily.       . pravastatin (PRAVACHOL) 80 MG tablet Take 80 mg by mouth daily.       . prochlorperazine (COMPAZINE) 10 MG tablet Take 1 tablet (10 mg total) by mouth every 6 (six) hours as needed.  60 tablet  0   Labs:  Lab Results  Component Value Date   WBC 4.0 02/13/2012   HGB 12.3* 02/13/2012   HCT 35.6* 02/13/2012   MCV 91.3 02/13/2012   PLT 190 02/13/2012   Lab Results  Component Value Date   CREATININE 1.1 02/13/2012   BUN 26.0 02/13/2012   NA 138 02/13/2012   K 3.7 02/13/2012   CL 104 02/13/2012   CO2 27 02/13/2012   Lab Results  Component Value Date   ALT 12 02/13/2012   AST 15 02/13/2012   PHOS 3.8 02/10/2010   BILITOT 0.72 02/13/2012    Physical Examination:  weight is 119 lb 4.8 oz (54.114 kg). His  oral temperature is 98.6 F (37 C). His blood pressure is 112/81 and his pulse is 103. His respiration is 20 and oxygen saturation is 91%.    Wt Readings from Last 3 Encounters:  02/16/12 119 lb 4.8 oz (54.114 kg)  02/14/12 118 lb 6.4 oz (53.706 kg)  02/09/12 118 lb 6.4 oz (53.706 kg)    The oral cavity is moist without secondary infection Lungs - Normal respiratory effort, chest expands symmetrically. Lungs are clear to auscultation, no crackles or wheezes.  Heart has regular rhythm and rate  Abdomen is soft and non tender with normal bowel sounds Patient has some paralysis of his left arm and hand. He is unable to make a fist.  Assessment:  Patient tolerating treatments well  Plan: Continue treatment per original radiation prescription. He will be scheduled for physical therapy evaluation

## 2012-02-16 NOTE — Progress Notes (Signed)
Patient here via w/c weekly rad txs, has had 6 rad txs so far; patient has  runny clear drainage from his right nare. Dry cough, 91% room air, says his stomach hurts 2 on 1/10 scale, sharp pains, had a bowel movement this morning, a little bit nausea, patient more concerned over left arm wants physical therapy again, said his arm has been like that 1 year, 12:55 PM

## 2012-02-17 ENCOUNTER — Ambulatory Visit: Payer: Medicare Other

## 2012-02-17 ENCOUNTER — Ambulatory Visit
Admission: RE | Admit: 2012-02-17 | Discharge: 2012-02-17 | Disposition: A | Discharge status: Home / Self Care | Payer: Medicare Other | Source: Ambulatory Visit | Attending: Radiation Oncology | Admitting: Radiation Oncology

## 2012-02-20 ENCOUNTER — Ambulatory Visit: Payer: Medicare Other

## 2012-02-20 ENCOUNTER — Ambulatory Visit (HOSPITAL_BASED_OUTPATIENT_CLINIC_OR_DEPARTMENT_OTHER): Payer: Medicare Other

## 2012-02-20 ENCOUNTER — Ambulatory Visit (HOSPITAL_BASED_OUTPATIENT_CLINIC_OR_DEPARTMENT_OTHER): Payer: Medicare Other | Admitting: Internal Medicine

## 2012-02-20 ENCOUNTER — Ambulatory Visit
Admission: RE | Admit: 2012-02-20 | Discharge: 2012-02-20 | Disposition: A | Discharge status: Home / Self Care | Payer: Medicare Other | Source: Ambulatory Visit | Attending: Radiation Oncology | Admitting: Radiation Oncology

## 2012-02-20 ENCOUNTER — Other Ambulatory Visit (HOSPITAL_BASED_OUTPATIENT_CLINIC_OR_DEPARTMENT_OTHER): Payer: Medicare Other | Admitting: Lab

## 2012-02-20 VITALS — BP 102/77 | HR 83 | Temp 96.3°F | Resp 20 | Ht 66.0 in | Wt 113.7 lb

## 2012-02-20 DIAGNOSIS — Z5111 Encounter for antineoplastic chemotherapy: Secondary | ICD-10-CM

## 2012-02-20 DIAGNOSIS — C3490 Malignant neoplasm of unspecified part of unspecified bronchus or lung: Secondary | ICD-10-CM

## 2012-02-20 DIAGNOSIS — C3430 Malignant neoplasm of lower lobe, unspecified bronchus or lung: Secondary | ICD-10-CM

## 2012-02-20 LAB — CBC WITH DIFFERENTIAL/PLATELET
BASO%: 2.2 % — ABNORMAL HIGH (ref 0.0–2.0)
Eosinophils Absolute: 0.2 10*3/uL (ref 0.0–0.5)
LYMPH%: 23 % (ref 14.0–49.0)
MCHC: 34.9 g/dL (ref 32.0–36.0)
MONO#: 0.4 10*3/uL (ref 0.1–0.9)
MONO%: 10.9 % (ref 0.0–14.0)
NEUT#: 1.9 10*3/uL (ref 1.5–6.5)
Platelets: 141 10*3/uL (ref 140–400)
RBC: 3.86 10*6/uL — ABNORMAL LOW (ref 4.20–5.82)
RDW: 13.5 % (ref 11.0–14.6)
WBC: 3.2 10*3/uL — ABNORMAL LOW (ref 4.0–10.3)

## 2012-02-20 LAB — COMPREHENSIVE METABOLIC PANEL (CC13)
BUN: 23 mg/dL (ref 7.0–26.0)
CO2: 26 mEq/L (ref 22–29)
Creatinine: 1 mg/dL (ref 0.7–1.3)
Glucose: 99 mg/dl (ref 70–99)
Total Bilirubin: 0.42 mg/dL (ref 0.20–1.20)

## 2012-02-20 MED ORDER — HEPARIN SOD (PORK) LOCK FLUSH 100 UNIT/ML IV SOLN
500.0000 [IU] | Freq: Once | INTRAVENOUS | Status: AC | PRN
  Administered 2012-02-20: 500 [IU]
  Filled 2012-02-20: qty 5

## 2012-02-20 MED ORDER — SODIUM CHLORIDE 0.9 % IV SOLN
143.6000 mg | Freq: Once | INTRAVENOUS | Status: AC
  Administered 2012-02-20: 140 mg via INTRAVENOUS
  Filled 2012-02-20: qty 14

## 2012-02-20 MED ORDER — SODIUM CHLORIDE 0.9 % IV SOLN
Freq: Once | INTRAVENOUS | Status: AC
  Administered 2012-02-20: 50 mL via INTRAVENOUS

## 2012-02-20 MED ORDER — PACLITAXEL CHEMO INJECTION 300 MG/50ML
45.0000 mg/m2 | Freq: Once | INTRAVENOUS | Status: AC
  Administered 2012-02-20: 72 mg via INTRAVENOUS
  Filled 2012-02-20: qty 12

## 2012-02-20 MED ORDER — SODIUM CHLORIDE 0.9 % IJ SOLN
10.0000 mL | INTRAMUSCULAR | Status: DC | PRN
  Administered 2012-02-20: 10 mL
  Filled 2012-02-20: qty 10

## 2012-02-20 MED ORDER — ONDANSETRON 16 MG/50ML IVPB (CHCC)
16.0000 mg | Freq: Once | INTRAVENOUS | Status: AC
Start: 2012-02-20 — End: 2012-02-20
  Administered 2012-02-20: 16 mg via INTRAVENOUS

## 2012-02-20 MED ORDER — DEXAMETHASONE SODIUM PHOSPHATE 4 MG/ML IJ SOLN
20.0000 mg | Freq: Once | INTRAMUSCULAR | Status: AC
  Administered 2012-02-20: 20 mg via INTRAVENOUS

## 2012-02-20 MED ORDER — DIPHENHYDRAMINE HCL 50 MG/ML IJ SOLN
50.0000 mg | Freq: Once | INTRAMUSCULAR | Status: AC
  Administered 2012-02-20: 50 mg via INTRAVENOUS

## 2012-02-20 MED ORDER — FAMOTIDINE IN NACL 20-0.9 MG/50ML-% IV SOLN
20.0000 mg | Freq: Once | INTRAVENOUS | Status: AC
Start: 2012-02-20 — End: 2012-02-20
  Administered 2012-02-20: 20 mg via INTRAVENOUS

## 2012-02-20 NOTE — Progress Notes (Signed)
[facility] [phone]   [phone]  OFFICE PROGRESS NOTE  Julieanne Manson, MD [address] [place] [address]  DIAGNOSIS: non-small cell lung cancer, squamous cell carcinoma presenting with synchronous tumor as stage IIIA (T2a., N2, M0) in the right lung and questionably stage IA (T1a., N0, M0) in the left lung based on the imaging studies also the final pathology did not show metastatic squamous cell carcinoma in the mediastinal lymph nodes or the left lower lobe mass   PRIOR THERAPY: None   CURRENT THERAPY: Concurrent chemoradiation with weekly carboplatin for AUC of 2 and paclitaxel 45 mg/M2. He is status post 3 cycles.  INTERVAL HISTORY: Benjamin Moss 66 y.o. male returns to the clinic today for followup visit accompanied his caregiver. The patient is tolerating his current treatment with concurrent chemoradiation fairly well with no significant adverse effects. He denied having any significant fever or chills. No nausea or vomiting. He has no significant chest pain, cough or hemoptysis but continues to have shortness breath with exertion.  MEDICAL HISTORY: Past Medical History  Diagnosis Date  . Hyperlipidemia   . Hypertension   . COPD (chronic obstructive pulmonary disease)   . Stroke     left side weakness, ambulates with cane/ walker  . Right lower lobe lung mass   . Hypoxia   . Nodule of left lung   . Lung cancer 01/19/2012    ALLERGIES:   has no known allergies.  MEDICATIONS:  Current Outpatient Prescriptions  Medication Sig Dispense Refill  . albuterol (PROVENTIL HFA;VENTOLIN HFA) 108 (90 BASE) MCG/ACT inhaler Inhale 2 puffs into the lungs every 6 (six) hours as needed. For shortness of breath  1 Inhaler  0  . amLODipine (NORVASC) 10 MG tablet Take 10 mg by mouth daily.       . clopidogrel (PLAVIX) 75 MG tablet Take 75 mg by mouth daily.       . hydrochlorothiazide (HYDRODIURIL) 25 MG tablet Take 25 mg by mouth  daily.       Marland Kitchen HYDROcodone-acetaminophen (NORCO) 10-325 MG per tablet Take 1 tablet by mouth every 6 (six) hours as needed for pain.  30 tablet  0  . lidocaine-prilocaine (EMLA) cream Apply topically as needed. Apply a small amount to the port 1-2 hrs before chemo.  Cover with clear dsg to keep cream from rubbing off.  30 g  0  . lisinopril (PRINIVIL,ZESTRIL) 10 MG tablet Take 10 mg by mouth daily.       . pravastatin (PRAVACHOL) 80 MG tablet Take 80 mg by mouth daily.       . prochlorperazine (COMPAZINE) 10 MG tablet Take 1 tablet (10 mg total) by mouth every 6 (six) hours as needed.  60 tablet  0    SURGICAL HISTORY:  Past Surgical History  Procedure Date  . Tonsillectomy     REVIEW OF SYSTEMS:  A comprehensive review of systems was negative except for: Constitutional: positive for fatigue Respiratory: positive for dyspnea on exertion   PHYSICAL EXAMINATION: General appearance: alert, cooperative, fatigued and no distress Head: Normocephalic, without obvious abnormality, atraumatic Lymph nodes: Cervical, supraclavicular, and axillary nodes normal. Resp: clear to auscultation bilaterally Cardio: regular rate and rhythm, S1, S2 normal, no murmur, click, rub or gallop GI: soft, non-tender; bowel sounds normal; no masses,  no organomegaly Extremities: extremities normal, atraumatic, no cyanosis or edema Neurologic: Alert and oriented X 3, normal strength and tone. Normal symmetric reflexes. Normal coordination and gait  ECOG PERFORMANCE STATUS: 2 - Symptomatic, <50% confined to bed  Blood pressure 102/77, pulse 83, temperature 96.3 F (35.7 C), temperature source Oral, resp. rate 20, height 5\' 6"  (1.676 m), weight 113 lb 11.2 oz (51.574 kg).  LABORATORY DATA: Lab Results  Component Value Date   WBC 3.2* 02/20/2012   HGB 12.1* 02/20/2012   HCT 34.7* 02/20/2012   MCV 89.9 02/20/2012   PLT 141 02/20/2012      Chemistry      Component Value Date/Time   NA 138 02/13/2012 0846    NA 138 02/06/2012 0750   K 3.7 02/13/2012 0846   K 3.2* 02/06/2012 0750   CL 104 02/13/2012 0846   CL 95* 02/06/2012 0750   CO2 27 02/13/2012 0846   CO2 30 02/06/2012 0750   BUN 26.0 02/13/2012 0846   BUN 30* 02/06/2012 0750   CREATININE 1.1 02/13/2012 0846   CREATININE 1.19 02/06/2012 0750      Component Value Date/Time   CALCIUM 9.7 02/13/2012 0846   CALCIUM 10.5 02/06/2012 0750   ALKPHOS 79 02/13/2012 0846   ALKPHOS 99 02/06/2012 0750   AST 15 02/13/2012 0846   AST 26 02/06/2012 0750   ALT 12 02/13/2012 0846   ALT 19 02/06/2012 0750   BILITOT 0.72 02/13/2012 0846   BILITOT 0.8 02/06/2012 0750       RADIOGRAPHIC STUDIES: Ir Fluoro Guide Cv Line Right  02/06/2012  *RADIOLOGY REPORT*  Indication: History of lung cancer, in need of intravenous access for chemotherapy administration.  IMPLANTED PORT A CATH PLACEMENT WITH ULTRASOUND AND FLUOROSCOPIC GUIDANCE  Sedation: Versed 1 mg IV; Fentanyl 100 mcg IV; Ancef 1 gm IV; IV antibiotic was given in an appropriate time interval prior to skin puncture.  Total Moderate Sedation Time: 25 minutes.  Contrast: None  Fluoroscopy Time: 0.6 minutes.  Complications: None immediate  Procedure:  The procedure, risks, benefits, and alternatives were explained to the patient.  Questions regarding the procedure were encouraged and answered.  The patient understands and consents to the procedure.  The right neck and chest were prepped with chlorhexidine in a sterile fashion, and a sterile drape was applied covering the operative field.  Maximum barrier sterile technique with sterile gowns and gloves were used for the procedure.  A timeout was performed prior to the initiation of the procedure.  Local anesthesia was provided with 1% lidocaine with epinephrine.  After creating a small venotomy incision, a micropuncture kit was utilized to access the right internal jugular vein under direct, real-time ultrasound guidance.  Ultrasound image documentation was performed.  The  microwire was kinked to measure appropriate catheter length.  A subcutaneous port pocket was then created along the upper chest wall utilizing a combination of sharp and blunt dissection.  The pocket was irrigated with sterile saline.  A single lumen ISP power injectable port was chosen for placement.  The 8 Fr catheter was tunneled from the port pocket site to the venotomy incision.  The port was placed in the pocket.  The external catheter was trimmed to appropriate length.  At the venotomy, an 8 Fr peel-away sheath was placed over a guidewire under fluoroscopic guidance.  The catheter was then placed through the sheath and the sheath was removed.  Final catheter positioning was confirmed and documented with a fluoroscopic spot radiograph.  The port was accessed with a Huber needle, aspirated and flushed with heparinized saline.  The venotomy site was closed with an interrupted 4-0 Vicryl suture. The port pocket  incision was closed with interrupted 2-0 Vicryl suture and the skin was opposed with a running subcuticular 4-0 Vicryl suture.  Dermabond and Steri-strips were applied to both incisions.  Dressings were placed.  The patient tolerated the procedure well without immediate post procedural complication.  Findings:  After catheter placement, the tip lies at the superior cavoatrial junction. The catheter aspirates and flushes normally and is ready for immediate use.  IMPRESSION:  Successful placement of a right internal jugular approach single lumen power injectable Port-A-Cath.  The catheter is ready for immediate use.   Original Report Authenticated By: Tacey Ruiz, MD    ASSESSMENT: This is a very pleasant 66 years old African American male with a synchronous lung cancer presenting as stage IIIa non-small cell lung cancer and the right lung as well as a stage IA at the left lung base. He is currently undergoing concurrent chemoradiation. He is tolerating his treatment fairly well.  PLAN: We'll proceed  with cycle #4 of his chemotherapy today as scheduled. The patient would come back for followup visit in 2 weeks for reevaluation and management any adverse effect of his chemotherapy.  He was advised to call me immediately if he has any concerning symptoms in the interval.  All questions were answered. The patient knows to call the clinic with any problems, questions or concerns. We can certainly see the patient much sooner if necessary.  I spent 15 minutes counseling the patient face to face. The total time spent in the appointment was 25 minutes.

## 2012-02-20 NOTE — Progress Notes (Signed)
Quick Note:  Call patient with the result and order K Dur 20 meq po qd. ______ 

## 2012-02-20 NOTE — Patient Instructions (Addendum)
[facility] Discharge Instructions for Patients Receiving Chemotherapy  Today you received the following chemotherapy agents Taxol/ Carbo To help prevent nausea and vomiting after your treatment, we encourage you to take your nausea medication  Per Dr. Arbutus Ped  If you develop nausea and vomiting that is not controlled by your nausea medication, call the clinic. If it is after clinic hours your family physician or the after hours number for the clinic or go to the Emergency Department.   BELOW ARE SYMPTOMS THAT SHOULD BE REPORTED IMMEDIATELY:  *FEVER GREATER THAN 100.5 F  *CHILLS WITH OR WITHOUT FEVER  NAUSEA AND VOMITING THAT IS NOT CONTROLLED WITH YOUR NAUSEA MEDICATION  *UNUSUAL SHORTNESS OF BREATH  *UNUSUAL BRUISING OR BLEEDING  TENDERNESS IN MOUTH AND THROAT WITH OR WITHOUT PRESENCE OF ULCERS  *URINARY PROBLEMS  *BOWEL PROBLEMS  UNUSUAL RASH Items with * indicate a potential emergency and should be followed up as soon as possible.  Feel free to call the clinic you have any questions or concerns. The clinic phone number is [phone].   I have been informed and understand all the instructions given to me. I know to contact the clinic, my physician, or go to the Emergency Department if any problems should occur. I do not have any questions at this time, but understand that I may call the clinic during office hours   should I have any questions or need assistance in obtaining follow up care.    __________________________________________  _____________  __________ Signature of Patient or Authorized Representative            Date                   Time    __________________________________________ Nurse's Signature

## 2012-02-21 ENCOUNTER — Ambulatory Visit: Payer: Medicare Other

## 2012-02-21 ENCOUNTER — Encounter: Payer: Self-pay | Admitting: Radiation Oncology

## 2012-02-21 ENCOUNTER — Ambulatory Visit
Admission: RE | Admit: 2012-02-21 | Discharge: 2012-02-21 | Disposition: A | Discharge status: Home / Self Care | Payer: Medicare Other | Source: Ambulatory Visit | Attending: Radiation Oncology | Admitting: Radiation Oncology

## 2012-02-21 ENCOUNTER — Telehealth: Payer: Self-pay | Admitting: Internal Medicine

## 2012-02-21 VITALS — BP 107/81 | HR 71 | Temp 97.5°F | Wt 117.2 lb

## 2012-02-21 DIAGNOSIS — C3490 Malignant neoplasm of unspecified part of unspecified bronchus or lung: Secondary | ICD-10-CM

## 2012-02-21 MED ORDER — FLUCONAZOLE 100 MG PO TABS: 100.0000 mg | ORAL_TABLET | Freq: Every day | ORAL | Status: DC

## 2012-02-21 NOTE — Telephone Encounter (Signed)
S/w pt re appt for 12/2 and also confirmed 11/25. Pt will get new schedule when he comes in for xrt today. Per AJ ok to use break slot on 12/2 so pt can be seen prior to xrt and chemo.

## 2012-02-21 NOTE — Progress Notes (Signed)
Benjamin Moss has received 9/35 fractions to his right lung.  Completed 4th cycle of chemotherapy today.   Denies any pain, nausea nor SOB.  He does c/o sore throat and a headache.  He reports that since his stroke last September he experiences frequent headaches  In the frontal region.  Denies any vision changes. Note whitish area in the roof of his mouth near his front teeth and in the sides at the back of his throat. He states that he has a "sore" throat.

## 2012-02-21 NOTE — Progress Notes (Signed)
[facility]    Radiation Oncology [address] [place]     Benjamin Moss, M.D. [place], [place] [address]               Billie Lade, M.D., Ph.D. Phone: [phone]      Molli Hazard A. Kathrynn Running, M.D. Fax: [phone]      Radene Gunning, M.D., Ph.D.         Lurline Hare, M.D.         Grayland Jack, M.D Weekly Treatment Management Note  Name: Benjamin Moss     MRN: [id number]        CSN: [id number] Date: 02/21/2012      DOB: 1945/12/10  CC: Benjamin Manson, MD         [place]    Status: Outpatient  Diagnosis: The encounter diagnosis was Lung cancer.  Current Dose: 16.2 Gy  Current Fraction: 9  Planned Dose: 63.0 Gy  Narrative: [name] was seen today for weekly treatment management. The chart was checked and MVCT  were reviewed. He is starting to have a sore throat. He continues to complain of chronic headaches this is minimally helped with Norco. He denies any cough or breathing problems.    Review of patient's allergies indicates no known allergies.  Current Outpatient Prescriptions  Medication Sig Dispense Refill  . albuterol (PROVENTIL HFA;VENTOLIN HFA) 108 (90 BASE) MCG/ACT inhaler Inhale 2 puffs into the lungs every 6 (six) hours as needed. For shortness of breath  1 Inhaler  0  . amLODipine (NORVASC) 10 MG tablet Take 10 mg by mouth daily.       . clopidogrel (PLAVIX) 75 MG tablet Take 75 mg by mouth daily.       . fluconazole (DIFLUCAN) 100 MG tablet Take 1 tablet (100 mg total) by mouth daily.  8 tablet  0  . hydrochlorothiazide (HYDRODIURIL) 25 MG tablet Take 25 mg by mouth daily.       Marland Kitchen HYDROcodone-acetaminophen (NORCO) 10-325 MG per tablet Take 1 tablet by mouth every 6 (six) hours as needed for pain.  30 tablet  0  . lidocaine-prilocaine (EMLA) cream Apply topically as needed. Apply a small amount to the port 1-2 hrs before chemo.  Cover with clear dsg to keep cream from rubbing off.  30 g  0  . lisinopril (PRINIVIL,ZESTRIL) 10 MG tablet  Take 10 mg by mouth daily.       . pravastatin (PRAVACHOL) 80 MG tablet Take 80 mg by mouth daily.       . prochlorperazine (COMPAZINE) 10 MG tablet Take 1 tablet (10 mg total) by mouth every 6 (six) hours as needed.  60 tablet  0   No current facility-administered medications for this encounter.   Facility-Administered Medications Ordered in Other Encounters  Medication Dose Route Frequency Provider Last Rate Last Dose  . [COMPLETED] 0.9 %  sodium chloride infusion   Intravenous Once Si Gaul, MD   50 mL at 02/20/12 1342  . [COMPLETED] CARBOplatin (PARAPLATIN) 140 mg in sodium chloride 0.9 % 100 mL chemo infusion  140 mg Intravenous Once Si Gaul, MD   140 mg at 02/20/12 1548  . [COMPLETED] dexamethasone (DECADRON) injection 20 mg  20 mg Intravenous Once Si Gaul, MD   20 mg at 02/20/12 1344  . [COMPLETED] diphenhydrAMINE (BENADRYL) injection 50 mg  50 mg Intravenous Once Si Gaul, MD   50 mg at 02/20/12 1344  . [COMPLETED] famotidine (PEPCID) IVPB 20 mg  20  mg Intravenous Once Si Gaul, MD   20 mg at 02/20/12 1414  . [COMPLETED] heparin lock flush 100 unit/mL  500 Units Intracatheter Once PRN Si Gaul, MD   500 Units at 02/20/12 1623  . [COMPLETED] ondansetron (ZOFRAN) IVPB 16 mg  16 mg Intravenous Once Si Gaul, MD   16 mg at 02/20/12 1344  . [COMPLETED] PACLitaxel (TAXOL) 72 mg in dextrose 5 % 250 mL chemo infusion (</= 80mg /m2)  45 mg/m2 (Treatment Plan Actual) Intravenous Once Si Gaul, MD   72 mg at 02/20/12 1441  . [DISCONTINUED] sodium chloride 0.9 % injection 10 mL  10 mL Intracatheter PRN Si Gaul, MD   10 mL at 02/20/12 1623   Labs:  Lab Results  Component Value Date   WBC 3.2* 02/20/2012   HGB 12.1* 02/20/2012   HCT 34.7* 02/20/2012   MCV 89.9 02/20/2012   PLT 141 02/20/2012   Lab Results  Component Value Date   CREATININE 1.0 02/20/2012   BUN 23.0 02/20/2012   NA 141 02/20/2012   K 3.2* 02/20/2012   CL 108*  02/20/2012   CO2 26 02/20/2012   Lab Results  Component Value Date   ALT 14 02/20/2012   AST 16 02/20/2012   PHOS 3.8 02/10/2010   BILITOT 0.42 02/20/2012    Physical Examination:  weight is 117 lb 3.2 oz (53.162 kg). His temperature is 97.5 F (36.4 C). His blood pressure is 107/81 and his pulse is 71.    Wt Readings from Last 3 Encounters:  02/21/12 117 lb 3.2 oz (53.162 kg)  02/20/12 113 lb 11.2 oz (51.574 kg)  02/16/12 119 lb 4.8 oz (54.114 kg)     Lungs - Normal respiratory effort, chest expands symmetrically. Lungs are clear to auscultation, no crackles or wheezes.  Heart has regular rhythm and rate  Abdomen is soft and non tender with normal bowel sounds The oral cavity reveals a Candida infection  Assessment:  Patient tolerating treatments well  Plan: Continue treatment per original radiation prescription.  The patient was given a Diflucan prescription in light of his Candida  Infection.

## 2012-02-22 ENCOUNTER — Ambulatory Visit: Payer: Medicare Other | Admitting: Physical Therapy

## 2012-02-22 ENCOUNTER — Ambulatory Visit: Payer: Medicare Other

## 2012-02-22 ENCOUNTER — Telehealth: Payer: Self-pay | Admitting: *Deleted

## 2012-02-22 DIAGNOSIS — E876 Hypokalemia: Secondary | ICD-10-CM

## 2012-02-22 MED ORDER — POTASSIUM CHLORIDE ER 10 MEQ PO TBCR: 20.0000 meq | EXTENDED_RELEASE_TABLET | Freq: Every day | ORAL | Status: DC

## 2012-02-22 NOTE — Telephone Encounter (Signed)
Message copied by Caren Griffins on Wed Feb 22, 2012 11:48 AM ------      Message from: Si Gaul      Created: Mon Feb 20, 2012  7:57 PM       Call patient with the result and order K Dur 20 meq po qd

## 2012-02-22 NOTE — Telephone Encounter (Signed)
Called pt, he verbalized understanding regarding taking K x 7 days.  SLJ

## 2012-02-23 ENCOUNTER — Ambulatory Visit: Payer: Medicare Other

## 2012-02-24 ENCOUNTER — Ambulatory Visit: Payer: Medicare Other

## 2012-02-25 ENCOUNTER — Other Ambulatory Visit: Payer: Self-pay | Admitting: Internal Medicine

## 2012-02-27 ENCOUNTER — Ambulatory Visit: Payer: Medicare Other

## 2012-02-27 ENCOUNTER — Other Ambulatory Visit: Payer: Self-pay | Admitting: *Deleted

## 2012-02-27 ENCOUNTER — Other Ambulatory Visit: Payer: Medicare Other | Admitting: Lab

## 2012-02-27 DIAGNOSIS — C3490 Malignant neoplasm of unspecified part of unspecified bronchus or lung: Secondary | ICD-10-CM

## 2012-02-27 MED ORDER — HYDROCODONE-ACETAMINOPHEN 10-325 MG PO TABS: 1.0000 | ORAL_TABLET | Freq: Four times a day (QID) | ORAL | Status: DC | PRN

## 2012-02-28 ENCOUNTER — Ambulatory Visit: Payer: Medicare Other

## 2012-02-28 ENCOUNTER — Telehealth: Payer: Self-pay

## 2012-02-28 NOTE — Telephone Encounter (Signed)
Patient has missed several radiation treatments.Attempted to call home phone, mobile has been disconnected and a neighbor's number without success.He isn't an inpatient.Will try again later today and in morning.

## 2012-02-29 ENCOUNTER — Ambulatory Visit: Payer: Medicare Other

## 2012-02-29 ENCOUNTER — Telehealth: Payer: Self-pay

## 2012-02-29 NOTE — Telephone Encounter (Signed)
Patient finally contacted by phone, states he will try to be here for treatment on Monday but had no money for transportation to get here.Informed to always call us if not coming as I was about to call police to check on home status since he was no show for several treatments and there was no  Answer via home or cell phone.Informed Faith RT of status.

## 2012-03-02 ENCOUNTER — Ambulatory Visit: Payer: Medicare Other

## 2012-03-05 ENCOUNTER — Ambulatory Visit: Payer: Medicare Other

## 2012-03-05 ENCOUNTER — Ambulatory Visit: Payer: Medicare Other | Attending: Radiation Oncology | Admitting: Physical Therapy

## 2012-03-05 ENCOUNTER — Other Ambulatory Visit: Payer: Medicare Other | Admitting: Lab

## 2012-03-05 ENCOUNTER — Ambulatory Visit: Payer: Medicare Other | Admitting: Physician Assistant

## 2012-03-05 DIAGNOSIS — R209 Unspecified disturbances of skin sensation: Secondary | ICD-10-CM | POA: Insufficient documentation

## 2012-03-05 DIAGNOSIS — M25619 Stiffness of unspecified shoulder, not elsewhere classified: Secondary | ICD-10-CM | POA: Insufficient documentation

## 2012-03-05 DIAGNOSIS — IMO0001 Reserved for inherently not codable concepts without codable children: Secondary | ICD-10-CM | POA: Insufficient documentation

## 2012-03-06 ENCOUNTER — Ambulatory Visit
Admission: RE | Admit: 2012-03-06 | Discharge: 2012-03-06 | Disposition: A | Discharge status: Home / Self Care | Payer: Medicare Other | Source: Ambulatory Visit | Attending: Radiation Oncology | Admitting: Radiation Oncology

## 2012-03-06 ENCOUNTER — Ambulatory Visit: Payer: Medicare Other

## 2012-03-06 VITALS — BP 133/118 | HR 74 | Temp 98.6°F | Wt 120.0 lb

## 2012-03-06 DIAGNOSIS — C3490 Malignant neoplasm of unspecified part of unspecified bronchus or lung: Secondary | ICD-10-CM

## 2012-03-06 NOTE — Progress Notes (Signed)
Patient here for weekly assessment after missing several radiation treatments the last on 02/21/12.Denies pain,shortness of breath or cough.

## 2012-03-06 NOTE — Progress Notes (Signed)
[facility]    Radiation Oncology [address] [place]     Maryln Gottron, M.D. [place], [place] [address]               Billie Lade, M.D., Ph.D. Phone: [phone]      Molli Hazard A. Kathrynn Running, M.D. Fax: [phone]      Radene Gunning, M.D., Ph.D.         Lurline Hare, M.D.         Grayland Jack, M.D Weekly Treatment Management Note  Name: Benjamin Moss     MRN: [id number]        CSN: [id number] Date: 03/06/2012      DOB: 13-Dec-1945  CC: Benjamin Manson, MD         [place]    Status: Outpatient  Diagnosis: The encounter diagnosis was Lung cancer.  Current Dose: 18 Gy  Current Fraction: 10  Planned Dose: 63.0 Gy  Narrative: Benjamin Moss was seen today for weekly treatment management. The chart was checked and MVCT  were reviewed. He has missed several treatments secondary to transportation problems he denies any breathing problems pain within the chest area or swallowing difficulties.  Review of patient's allergies indicates no known allergies.  Current Outpatient Prescriptions  Medication Sig Dispense Refill  . albuterol (PROVENTIL HFA;VENTOLIN HFA) 108 (90 BASE) MCG/ACT inhaler Inhale 2 puffs into the lungs every 6 (six) hours as needed. For shortness of breath  1 Inhaler  0  . amLODipine (NORVASC) 10 MG tablet Take 10 mg by mouth daily.       . clopidogrel (PLAVIX) 75 MG tablet Take 75 mg by mouth daily.       . fluconazole (DIFLUCAN) 100 MG tablet Take 1 tablet (100 mg total) by mouth daily.  8 tablet  0  . hydrochlorothiazide (HYDRODIURIL) 25 MG tablet Take 25 mg by mouth daily.       Marland Kitchen HYDROcodone-acetaminophen (NORCO) 10-325 MG per tablet Take 1 tablet by mouth every 6 (six) hours as needed for pain.  30 tablet  0  . lidocaine-prilocaine (EMLA) cream Apply topically as needed. Apply a small amount to the port 1-2 hrs before chemo.  Cover with clear dsg to keep cream from rubbing off.  30 g  0  . lisinopril (PRINIVIL,ZESTRIL) 10 MG tablet Take 10 mg by  mouth daily.       . potassium chloride (KLOR-CON 10) 10 MEQ tablet Take 2 tablets (20 mEq total) by mouth daily. Take daily x 7 days  14 tablet  0  . pravastatin (PRAVACHOL) 80 MG tablet Take 80 mg by mouth daily.       . prochlorperazine (COMPAZINE) 10 MG tablet TAKE ONE TABLET BY MOUTH EVERY SIX HOURS AS NEEDED  60 tablet  0   Labs:  Lab Results  Component Value Date   WBC 3.2* 02/20/2012   HGB 12.1* 02/20/2012   HCT 34.7* 02/20/2012   MCV 89.9 02/20/2012   PLT 141 02/20/2012   Lab Results  Component Value Date   CREATININE 1.0 02/20/2012   BUN 23.0 02/20/2012   NA 141 02/20/2012   K 3.2* 02/20/2012   CL 108* 02/20/2012   CO2 26 02/20/2012   Lab Results  Component Value Date   ALT 14 02/20/2012   AST 16 02/20/2012   PHOS 3.8 02/10/2010   BILITOT 0.42 02/20/2012    Physical Examination:  weight is 120 lb (54.432 kg). His temperature is 98.6 F (37 C).  His blood pressure is 133/118 and his pulse is 74.    Wt Readings from Last 3 Encounters:  03/06/12 120 lb (54.432 kg)  02/21/12 117 lb 3.2 oz (53.162 kg)  02/20/12 113 lb 11.2 oz (51.574 kg)     Lungs - Normal respiratory effort, chest expands symmetrically. Lungs are clear to auscultation, no crackles or wheezes.  Heart has regular rhythm and rate  Abdomen is soft and non tender with normal bowel sounds  Assessment:  Patient tolerating treatments well  Plan: Continue treatment per original radiation prescription

## 2012-03-07 ENCOUNTER — Ambulatory Visit: Payer: Medicare Other | Admitting: Physical Therapy

## 2012-03-07 ENCOUNTER — Ambulatory Visit
Admission: RE | Admit: 2012-03-07 | Discharge: 2012-03-07 | Disposition: A | Discharge status: Home / Self Care | Payer: Medicare Other | Source: Ambulatory Visit | Attending: Radiation Oncology | Admitting: Radiation Oncology

## 2012-03-07 ENCOUNTER — Ambulatory Visit: Payer: Medicare Other

## 2012-03-08 ENCOUNTER — Ambulatory Visit: Payer: Medicare Other

## 2012-03-09 ENCOUNTER — Ambulatory Visit: Payer: Medicare Other

## 2012-03-09 ENCOUNTER — Other Ambulatory Visit: Payer: Self-pay | Admitting: Internal Medicine

## 2012-03-12 ENCOUNTER — Ambulatory Visit: Payer: Medicare Other

## 2012-03-12 ENCOUNTER — Other Ambulatory Visit: Payer: Medicare Other

## 2012-03-13 ENCOUNTER — Ambulatory Visit: Payer: Medicare Other

## 2012-03-13 ENCOUNTER — Ambulatory Visit: Payer: Medicare Other | Admitting: Physical Therapy

## 2012-03-14 ENCOUNTER — Ambulatory Visit: Payer: Medicare Other | Admitting: Physical Therapy

## 2012-03-14 ENCOUNTER — Ambulatory Visit: Payer: Medicare Other

## 2012-03-14 ENCOUNTER — Ambulatory Visit: Admission: RE | Admit: 2012-03-14 | Payer: Medicare Other | Source: Ambulatory Visit

## 2012-03-15 ENCOUNTER — Ambulatory Visit: Admission: RE | Admit: 2012-03-15 | Payer: Medicare Other | Source: Ambulatory Visit

## 2012-03-15 ENCOUNTER — Ambulatory Visit: Payer: Medicare Other

## 2012-03-16 ENCOUNTER — Ambulatory Visit: Payer: Medicare Other

## 2012-03-16 ENCOUNTER — Ambulatory Visit: Admission: RE | Admit: 2012-03-16 | Payer: Medicare Other | Source: Ambulatory Visit

## 2012-03-19 ENCOUNTER — Ambulatory Visit: Payer: Medicare Other

## 2012-03-19 ENCOUNTER — Ambulatory Visit
Admission: RE | Admit: 2012-03-19 | Discharge: 2012-03-19 | Disposition: A | Discharge status: Home / Self Care | Payer: Medicare Other | Source: Ambulatory Visit | Attending: Radiation Oncology | Admitting: Radiation Oncology

## 2012-03-19 ENCOUNTER — Telehealth: Payer: Self-pay | Admitting: Radiation Oncology

## 2012-03-19 ENCOUNTER — Ambulatory Visit: Admission: RE | Admit: 2012-03-19 | Payer: Medicare Other | Source: Ambulatory Visit

## 2012-03-19 VITALS — BP 111/78 | HR 71 | Temp 98.0°F | Wt 115.3 lb

## 2012-03-19 DIAGNOSIS — C3490 Malignant neoplasm of unspecified part of unspecified bronchus or lung: Secondary | ICD-10-CM

## 2012-03-19 NOTE — Telephone Encounter (Signed)
Pt here today to inquire about transportation assistance and was given 3 bus passes. Pt also has an appt w [facility] tmrw for addl transportation assistance and has been working with SW Lauren for assistance with that.

## 2012-03-19 NOTE — Progress Notes (Signed)
Here for routine weekly under treat  Visit.Completed 12 of 35 treatments.Denies pain ,shortness of breath or cough.

## 2012-03-19 NOTE — Progress Notes (Signed)
[facility]    Radiation Oncology [address] [place]     Maryln Gottron, M.D. [place], [place] [address]               Billie Lade, M.D., Ph.D. Phone: [phone]      Molli Hazard A. Kathrynn Running, M.D. Fax: [phone]      Radene Gunning, M.D., Ph.D.         Lurline Hare, M.D.         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 The chart was checked and MVCT  were reviewed. He continues to tolerate his treatments well without any significant fatigue,  swallowing difficulties or pain with swallowing.  He has missed several of his treatments in light of transportation problems.  Review of patient's allergies indicates no known allergies.  Current Outpatient Prescriptions  Medication Sig Dispense Refill  . albuterol (PROVENTIL HFA;VENTOLIN HFA) 108 (90 BASE) MCG/ACT inhaler Inhale 2 puffs into the lungs every 6 (six) hours as needed. For shortness of breath  1 Inhaler  0  . amLODipine (NORVASC) 10 MG tablet Take 10 mg by mouth daily.       . clopidogrel (PLAVIX) 75 MG tablet Take 75 mg by mouth daily.       . fluconazole (DIFLUCAN) 100 MG tablet Take 1 tablet (100 mg total) by mouth daily.  8 tablet  0  . hydrochlorothiazide (HYDRODIURIL) 25 MG tablet Take 25 mg by mouth daily.       Marland Kitchen HYDROcodone-acetaminophen (NORCO) 10-325 MG per tablet Take 1 tablet by mouth every 6 (six) hours as needed for pain.  30 tablet  0  . lidocaine-prilocaine (EMLA) cream Apply topically as needed. Apply a small amount to the port 1-2 hrs before chemo.  Cover with clear dsg to keep cream from  rubbing off.  30 g  0  . lisinopril (PRINIVIL,ZESTRIL) 10 MG tablet Take 10 mg by mouth daily.       . potassium chloride (KLOR-CON 10) 10 MEQ tablet Take 2 tablets (20 mEq total) by mouth daily. Take daily x 7 days  14 tablet  0  . pravastatin (PRAVACHOL) 80 MG tablet Take 80 mg by mouth daily.       . prochlorperazine (COMPAZINE) 10 MG tablet TAKE ONE TABLET BY MOUTH EVERY SIX HOURS AS NEEDED  60 tablet  0   Labs:  Lab Results  Component Value Date   WBC 3.2* 02/20/2012   HGB 12.1* 02/20/2012   HCT 34.7* 02/20/2012   MCV 89.9 02/20/2012   PLT 141 02/20/2012   Lab Results  Component Value Date   CREATININE 1.0 02/20/2012   BUN 23.0 02/20/2012   NA 141 02/20/2012   K 3.2* 02/20/2012   CL 108* 02/20/2012   CO2 26 02/20/2012   Lab Results  Component Value Date   ALT 14 02/20/2012   AST 16 02/20/2012   PHOS 3.8 02/10/2010   BILITOT 0.42 02/20/2012    Physical  Examination:  weight is 115 lb 4.8 oz (52.3 kg). His temperature is 98 F (36.7 C). His blood pressure is 111/78 and his pulse is 71.    Wt Readings from Last 3 Encounters:  03/19/12 115 lb 4.8 oz (52.3 kg)  03/06/12 120 lb (54.432 kg)  02/21/12 117 lb 3.2 oz (53.162 kg)    No palpable supraclavicular or axillary adenopathy Lungs - Normal respiratory effort, chest expands symmetrically. Lungs are clear to auscultation, no crackles or wheezes.  Heart has regular rhythm and rate  Abdomen is soft and non tender with normal bowel sounds  Assessment:  Patient tolerating treatments well  Plan: Continue treatment per original radiation prescription

## 2012-03-20 ENCOUNTER — Ambulatory Visit: Payer: Medicare Other

## 2012-03-20 ENCOUNTER — Ambulatory Visit: Admission: RE | Admit: 2012-03-20 | Payer: Medicare Other | Source: Ambulatory Visit

## 2012-03-20 ENCOUNTER — Ambulatory Visit: Payer: Medicare Other | Admitting: Physical Therapy

## 2012-03-20 ENCOUNTER — Ambulatory Visit
Admission: RE | Admit: 2012-03-20 | Discharge: 2012-03-20 | Disposition: A | Discharge status: Home / Self Care | Payer: Medicare Other | Source: Ambulatory Visit | Attending: Radiation Oncology | Admitting: Radiation Oncology

## 2012-03-21 ENCOUNTER — Ambulatory Visit
Admission: RE | Admit: 2012-03-21 | Discharge: 2012-03-21 | Disposition: A | Discharge status: Home / Self Care | Payer: Medicare Other | Source: Ambulatory Visit | Attending: Radiation Oncology | Admitting: Radiation Oncology

## 2012-03-21 ENCOUNTER — Ambulatory Visit: Admission: RE | Admit: 2012-03-21 | Payer: Medicare Other | Source: Ambulatory Visit

## 2012-03-21 ENCOUNTER — Ambulatory Visit: Payer: Medicare Other

## 2012-03-21 ENCOUNTER — Ambulatory Visit: Payer: Medicare Other | Admitting: Physical Therapy

## 2012-03-22 ENCOUNTER — Ambulatory Visit: Payer: Medicare Other

## 2012-03-22 ENCOUNTER — Ambulatory Visit
Admission: RE | Admit: 2012-03-22 | Discharge: 2012-03-22 | Disposition: A | Discharge status: Home / Self Care | Payer: Medicare Other | Source: Ambulatory Visit | Attending: Radiation Oncology | Admitting: Radiation Oncology

## 2012-03-22 ENCOUNTER — Ambulatory Visit: Admission: RE | Admit: 2012-03-22 | Payer: Medicare Other | Source: Ambulatory Visit

## 2012-03-23 ENCOUNTER — Ambulatory Visit: Admission: RE | Admit: 2012-03-23 | Payer: Medicare Other | Source: Ambulatory Visit

## 2012-03-23 ENCOUNTER — Ambulatory Visit: Payer: Medicare Other

## 2012-03-23 ENCOUNTER — Ambulatory Visit
Admission: RE | Admit: 2012-03-23 | Discharge: 2012-03-23 | Disposition: A | Discharge status: Home / Self Care | Payer: Medicare Other | Source: Ambulatory Visit | Attending: Radiation Oncology | Admitting: Radiation Oncology

## 2012-03-26 ENCOUNTER — Emergency Department (HOSPITAL_COMMUNITY)
Admission: EM | Admit: 2012-03-26 | Discharge: 2012-03-26 | Disposition: A | Discharge status: Home / Self Care | Payer: Medicare Other | Attending: Emergency Medicine | Admitting: Emergency Medicine

## 2012-03-26 ENCOUNTER — Emergency Department (HOSPITAL_COMMUNITY): Payer: Medicare Other

## 2012-03-26 ENCOUNTER — Ambulatory Visit: Payer: Medicare Other

## 2012-03-26 ENCOUNTER — Encounter (HOSPITAL_COMMUNITY): Payer: Self-pay | Admitting: Emergency Medicine

## 2012-03-26 ENCOUNTER — Ambulatory Visit: Admission: RE | Admit: 2012-03-26 | Payer: Medicare Other | Source: Ambulatory Visit

## 2012-03-26 ENCOUNTER — Ambulatory Visit: Payer: Medicare Other | Admitting: Physical Therapy

## 2012-03-26 ENCOUNTER — Ambulatory Visit
Admission: RE | Admit: 2012-03-26 | Discharge: 2012-03-26 | Disposition: A | Discharge status: Home / Self Care | Payer: Medicare Other | Source: Ambulatory Visit | Attending: Radiation Oncology | Admitting: Radiation Oncology

## 2012-03-26 DIAGNOSIS — Z8709 Personal history of other diseases of the respiratory system: Secondary | ICD-10-CM | POA: Insufficient documentation

## 2012-03-26 DIAGNOSIS — X500XXA Overexertion from strenuous movement or load, initial encounter: Secondary | ICD-10-CM | POA: Insufficient documentation

## 2012-03-26 DIAGNOSIS — M25579 Pain in unspecified ankle and joints of unspecified foot: Secondary | ICD-10-CM

## 2012-03-26 DIAGNOSIS — Z79899 Other long term (current) drug therapy: Secondary | ICD-10-CM | POA: Insufficient documentation

## 2012-03-26 DIAGNOSIS — S99919A Unspecified injury of unspecified ankle, initial encounter: Secondary | ICD-10-CM | POA: Insufficient documentation

## 2012-03-26 DIAGNOSIS — Z85118 Personal history of other malignant neoplasm of bronchus and lung: Secondary | ICD-10-CM | POA: Insufficient documentation

## 2012-03-26 DIAGNOSIS — I10 Essential (primary) hypertension: Secondary | ICD-10-CM | POA: Insufficient documentation

## 2012-03-26 DIAGNOSIS — S8990XA Unspecified injury of unspecified lower leg, initial encounter: Secondary | ICD-10-CM | POA: Insufficient documentation

## 2012-03-26 DIAGNOSIS — E785 Hyperlipidemia, unspecified: Secondary | ICD-10-CM | POA: Insufficient documentation

## 2012-03-26 DIAGNOSIS — Y929 Unspecified place or not applicable: Secondary | ICD-10-CM | POA: Insufficient documentation

## 2012-03-26 DIAGNOSIS — J449 Chronic obstructive pulmonary disease, unspecified: Secondary | ICD-10-CM | POA: Insufficient documentation

## 2012-03-26 DIAGNOSIS — Y939 Activity, unspecified: Secondary | ICD-10-CM | POA: Insufficient documentation

## 2012-03-26 DIAGNOSIS — F17200 Nicotine dependence, unspecified, uncomplicated: Secondary | ICD-10-CM | POA: Insufficient documentation

## 2012-03-26 DIAGNOSIS — R269 Unspecified abnormalities of gait and mobility: Secondary | ICD-10-CM | POA: Insufficient documentation

## 2012-03-26 DIAGNOSIS — J4489 Other specified chronic obstructive pulmonary disease: Secondary | ICD-10-CM | POA: Insufficient documentation

## 2012-03-26 DIAGNOSIS — Z8673 Personal history of transient ischemic attack (TIA), and cerebral infarction without residual deficits: Secondary | ICD-10-CM | POA: Insufficient documentation

## 2012-03-26 NOTE — ED Notes (Signed)
Per EMS: Slipped down steps on bus and caught leg underneath. C/o of pain in left lower leg, no abrasions or physical injuries.

## 2012-03-26 NOTE — ED Provider Notes (Signed)
History     CSN: [id number]  Arrival date & time 03/26/12  1227   First MD Initiated Contact with Patient 03/26/12 1403      Chief Complaint  Patient presents with  . Fall  . Ankle Pain    (Consider location/radiation/quality/duration/timing/severity/associated sxs/prior treatment) Patient is a 66 y.o. male presenting with ankle pain. The history is provided by the patient and a relative. No language interpreter was used.  Ankle Pain  The incident occurred 1 to 2 hours ago. Injury mechanism: twisting. The pain is at a severity of 5/10. The pain is moderate. The pain has been constant since onset. Pertinent negatives include no inability to bear weight, no loss of motion, no loss of sensation and no tingling. He reports no foreign bodies present. The symptoms are aggravated by bearing weight. He has tried nothing for the symptoms.  66 yo male with ankle injury while getting on the bus.  Better with rest. Worsening symptoms with weight bearing.  No prior injury.  pmh of lung cancer. 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   Receiving radiation  treatments and chemo therapy. No acute distress.    Past Medical History  Diagnosis Date  . Hyperlipidemia   . Hypertension   . COPD (chronic obstructive pulmonary disease)   . Stroke     left side weakness, ambulates with cane/ walker  . Right lower lobe lung mass   . Hypoxia   . Nodule of left lung   . Lung cancer 01/19/2012    Past Surgical History  Procedure Date  . Tonsillectomy     No family history on file.  History  Substance Use Topics  . Smoking status: Current Every Day Smoker -- 2.0 packs/day for 50 years    Types: Cigarettes  . Smokeless tobacco: Never Used  . Alcohol Use: No      Review of Systems  Constitutional: Negative.   HENT: Negative.   Eyes: Negative.   Respiratory: Negative.   Cardiovascular: Negative.   Gastrointestinal: Negative.   Musculoskeletal: Positive for  gait problem.  Neurological: Negative.  Negative for tingling.  Psychiatric/Behavioral: Negative.   All other systems reviewed and are negative.    Allergies  Review of patient's allergies indicates no known allergies.  Home Medications   Current Outpatient Rx  Name  Route  Sig  Dispense  Refill  . ALBUTEROL SULFATE HFA 108 (90 BASE) MCG/ACT IN AERS   Inhalation   Inhale 2 puffs into the lungs every 6 (six) hours as needed. For shortness of breath   1 Inhaler   0   . AMLODIPINE BESYLATE 10 MG PO TABS   Oral   Take 10 mg by mouth daily.          Marland Kitchen CLOPIDOGREL BISULFATE 75 MG PO TABS   Oral   Take 75 mg by mouth daily.          Marland Kitchen FLUCONAZOLE 100 MG PO TABS   Oral   Take 1 tablet (100 mg total) by mouth daily.   8 tablet   0     Take 2 tablets day #1   . HYDROCHLOROTHIAZIDE 25 MG PO TABS   Oral   Take 25 mg by mouth daily.          Marland Kitchen HYDROCODONE-ACETAMINOPHEN 10-325 MG PO TABS   Oral   Take 1 tablet by mouth every 6 (six) hours as needed for pain.   30 tablet  0     DR.MOHAMED IS AWARE THAT DR.KINARD FILLED THIS MED ...   . LIDOCAINE-PRILOCAINE 2.5-2.5 % EX CREA   Topical   Apply topically as needed. Apply a small amount to the port 1-2 hrs before chemo.  Cover with clear dsg to keep cream from rubbing off.   30 g   0   . LISINOPRIL 10 MG PO TABS   Oral   Take 10 mg by mouth daily.          Marland Kitchen PRAVASTATIN SODIUM 80 MG PO TABS   Oral   Take 80 mg by mouth daily.          Marland Kitchen PROCHLORPERAZINE MALEATE 10 MG PO TABS   Oral   Take 10 mg by mouth every 6 (six) hours as needed. For nausea           BP 140/106  Pulse 85  Temp 97.5 F (36.4 C) (Oral)  Resp 16  SpO2 90%  Physical Exam  Nursing note and vitals reviewed. Constitutional: He is oriented to person, place, and time. He appears well-developed and well-nourished.  HENT:  Head: Normocephalic.  Eyes: Conjunctivae normal and EOM are normal. Pupils are equal, round, and reactive to  light.  Neck: Normal range of motion. Neck supple.  Cardiovascular: Normal rate.   Pulmonary/Chest: Effort normal.  Abdominal: Soft.  Musculoskeletal: He exhibits tenderness. He exhibits no edema.       Left ankle tenderness. No swelling or erythema.  +CMS below injury.   Neurological: He is alert and oriented to person, place, and time.  Skin: Skin is warm and dry.  Psychiatric: He has a normal mood and affect.    ED Course  Procedures (including critical care time)  Labs Reviewed - No data to display Dg Ankle Complete Left  03/26/2012  *RADIOLOGY REPORT*  Clinical Data: Larey Seat with medial ankle pain.  LEFT ANKLE COMPLETE - 3+ VIEW  Comparison: None.  Findings: There is no evidence of fracture or dislocation.  There is no evidence of arthropathy or other focal bony abnormality. Soft tissues are unremarkable except for mild vascular calcification.  IMPRESSION: No acute osseous abnormality.   Original Report Authenticated By: Davonna Belling, M.D.      No diagnosis found.    MDM  L ankle injury with no fracture.  Using cane to ambulate prior to injury.  Has is own pain meds at home for his lung cancer.  L ankle film reviewed by myself.  Will follow up with ortho.  Ice and elevate.  Understands to return for worsening symptoms.          Remi Haggard, NP 03/26/12 2314

## 2012-03-26 NOTE — ED Notes (Signed)
ZOX:[id number]<AV> Expected date:03/26/12<BR> Expected time:12:23 PM<BR> Means of arrival:Ambulance<BR> Comments:<BR> 66yo/Fall

## 2012-03-26 NOTE — ED Notes (Signed)
Pt c/o of left ankle pain that worsens with pressure.

## 2012-03-27 ENCOUNTER — Ambulatory Visit: Payer: Medicare Other

## 2012-03-27 ENCOUNTER — Ambulatory Visit: Admission: RE | Admit: 2012-03-27 | Payer: Medicare Other | Source: Ambulatory Visit

## 2012-03-27 NOTE — ED Provider Notes (Signed)
Medical screening examination/treatment/procedure(s) were performed by non-physician practitioner and as supervising physician I was immediately available for consultation/collaboration.   Benny Lennert, MD 03/27/12 [phone]

## 2012-03-29 ENCOUNTER — Ambulatory Visit: Payer: Medicare Other

## 2012-03-29 ENCOUNTER — Ambulatory Visit: Admission: RE | Admit: 2012-03-29 | Payer: Medicare Other | Source: Ambulatory Visit

## 2012-03-30 ENCOUNTER — Ambulatory Visit: Payer: Medicare Other

## 2012-04-02 ENCOUNTER — Ambulatory Visit: Payer: Medicare Other

## 2012-04-02 ENCOUNTER — Ambulatory Visit: Admission: RE | Admit: 2012-04-02 | Payer: Medicare Other | Source: Ambulatory Visit

## 2012-04-02 ENCOUNTER — Other Ambulatory Visit: Payer: Self-pay | Admitting: Internal Medicine

## 2012-04-02 ENCOUNTER — Other Ambulatory Visit: Payer: Self-pay | Admitting: *Deleted

## 2012-04-02 NOTE — Progress Notes (Signed)
Pt FTKA for f/u a few weeks ago, per Dr Donnald Garre pt is to see Dimas [place] this week with lab work.  Rx's will not be refilled until pt is seen.  SLJ

## 2012-04-03 ENCOUNTER — Telehealth: Payer: Self-pay

## 2012-04-03 ENCOUNTER — Telehealth: Payer: Self-pay | Admitting: Internal Medicine

## 2012-04-03 ENCOUNTER — Ambulatory Visit: Payer: Medicare Other

## 2012-04-03 NOTE — Telephone Encounter (Signed)
s/w sophia and she will give him the appt info for 04/05/12       anne

## 2012-04-03 NOTE — Telephone Encounter (Signed)
Patient hasn't shown up for radiation treatment since 03/26/12.Attempted to call home phone and mobile phone, both have been temporarily disconnected.

## 2012-04-05 ENCOUNTER — Ambulatory Visit (HOSPITAL_BASED_OUTPATIENT_CLINIC_OR_DEPARTMENT_OTHER): Payer: Medicare Other | Admitting: Physician Assistant

## 2012-04-05 ENCOUNTER — Ambulatory Visit
Admission: RE | Admit: 2012-04-05 | Discharge: 2012-04-05 | Disposition: A | Discharge status: Home / Self Care | Payer: Medicare Other | Source: Ambulatory Visit | Attending: Radiation Oncology | Admitting: Radiation Oncology

## 2012-04-05 ENCOUNTER — Other Ambulatory Visit (HOSPITAL_BASED_OUTPATIENT_CLINIC_OR_DEPARTMENT_OTHER): Payer: Medicare Other | Admitting: Lab

## 2012-04-05 ENCOUNTER — Ambulatory Visit: Payer: Medicare Other | Admitting: Radiation Oncology

## 2012-04-05 ENCOUNTER — Ambulatory Visit: Payer: Medicare Other

## 2012-04-05 ENCOUNTER — Telehealth: Payer: Self-pay | Admitting: *Deleted

## 2012-04-05 ENCOUNTER — Encounter: Payer: Self-pay | Admitting: Radiation Oncology

## 2012-04-05 ENCOUNTER — Other Ambulatory Visit: Payer: Self-pay | Admitting: Medical Oncology

## 2012-04-05 VITALS — BP 150/98 | HR 88 | Temp 98.6°F | Resp 20 | Ht 66.0 in | Wt 115.3 lb

## 2012-04-05 VITALS — BP 134/102 | HR 79 | Temp 98.0°F | Resp 16 | Wt 116.7 lb

## 2012-04-05 DIAGNOSIS — C3490 Malignant neoplasm of unspecified part of unspecified bronchus or lung: Secondary | ICD-10-CM

## 2012-04-05 DIAGNOSIS — C3430 Malignant neoplasm of lower lobe, unspecified bronchus or lung: Secondary | ICD-10-CM

## 2012-04-05 DIAGNOSIS — R5381 Other malaise: Secondary | ICD-10-CM

## 2012-04-05 DIAGNOSIS — M545 Low back pain: Secondary | ICD-10-CM

## 2012-04-05 LAB — CBC WITH DIFFERENTIAL/PLATELET
Eosinophils Absolute: 0.2 10*3/uL (ref 0.0–0.5)
HCT: 35.5 % — ABNORMAL LOW (ref 38.4–49.9)
LYMPH%: 12.6 % — ABNORMAL LOW (ref 14.0–49.0)
MCHC: 34.5 g/dL (ref 32.0–36.0)
MONO#: 0.7 10*3/uL (ref 0.1–0.9)
NEUT#: 3.5 10*3/uL (ref 1.5–6.5)
NEUT%: 67.9 % (ref 39.0–75.0)
Platelets: 224 10*3/uL (ref 140–400)
WBC: 5.1 10*3/uL (ref 4.0–10.3)

## 2012-04-05 LAB — COMPREHENSIVE METABOLIC PANEL (CC13)
ALT: 8 U/L (ref 0–55)
AST: 13 U/L (ref 5–34)
Albumin: 3.8 g/dL (ref 3.5–5.0)
Alkaline Phosphatase: 86 U/L (ref 40–150)
BUN: 16 mg/dL (ref 7.0–26.0)
CO2: 26 meq/L (ref 22–29)
Calcium: 9.7 mg/dL (ref 8.4–10.4)
Chloride: 108 meq/L — ABNORMAL HIGH (ref 98–107)
Creatinine: 1.1 mg/dL (ref 0.7–1.3)
Glucose: 88 mg/dL (ref 70–99)
Potassium: 3.5 meq/L (ref 3.5–5.1)
Sodium: 146 meq/L — ABNORMAL HIGH (ref 136–145)
Total Bilirubin: 0.38 mg/dL (ref 0.20–1.20)
Total Protein: 7.2 g/dL (ref 6.4–8.3)

## 2012-04-05 MED ORDER — HYDROCODONE-ACETAMINOPHEN 10-325 MG PO TABS: 1.0000 | ORAL_TABLET | Freq: Four times a day (QID) | ORAL | Status: DC | PRN

## 2012-04-05 NOTE — Telephone Encounter (Signed)
Per charge RN and [facility] APP I have scheduled patietn for treatment. Radiation to give patient schedule.  JMW

## 2012-04-05 NOTE — Progress Notes (Addendum)
Patient reports he had a fall, "facedown" on 03/26/12 while getting on the [place] city bus resulting in a left ankle injury.  He states he now has pain in his bilateral ribs, left inside of his left ankle, and stomach.  He continues to have the same frontal headache that he reported on his initial consultation visit.  He did not receive any pain medication in the ED nor any scripts for pain.  He currently is out of his Hydrocodone prescription and cannot pick it up until he receives his Social Security check on tomorrow.  He denies any SOB, but note elevated BP today despite use of his antihypertensive medications today.

## 2012-04-05 NOTE — Patient Instructions (Addendum)
Continue with your course of concurrent chemotherapy and radiation therapy Follow up in 2 weeks

## 2012-04-05 NOTE — Progress Notes (Signed)
[facility]    Radiation Oncology [address] [place]     Maryln Gottron, M.D. [place], [place] [address]               Billie Lade, M.D., Ph.D. Phone: [phone]      Molli Hazard A. Kathrynn Running, M.D. Fax: [phone]      Radene Gunning, M.D., Ph.D.         Lurline Hare, M.D.         Grayland Jack, M.D Weekly Treatment Management Note  Name: Benjamin Moss     MRN: [id number]        CSN: [id number] Date: 04/05/2012      DOB: Aug 15, 1945  CC: Julieanne Manson, MD         [place]    Status: Outpatient  Diagnosis: The encounter diagnosis was Lung cancer.  Current Dose: 32.4 Gy  Current Fraction: 18  Planned Dose: 63.0 Gy  Narrative: Benjamin Moss was seen today for weekly treatment management. The chart was checked and MVCT  were reviewed. He is tolerating his treatments fairly well. He does have some esophageal symptoms and I've refilled his hydrocodone prescription today.  he is also having pain along the rib cage area and left ankle region after he fell getting off the bus last week. He was evaluated in the emergency room for this issue. Left ankle x-ray showed no fracture.  He denies any breathing problems.  Review of patient's allergies indicates no known allergies.  Current Outpatient Prescriptions  Medication Sig Dispense Refill  . albuterol (PROVENTIL HFA;VENTOLIN HFA) 108 (90 BASE) MCG/ACT inhaler Inhale 2 puffs into the lungs every 6 (six) hours as needed. For shortness of breath  1 Inhaler  0  . amLODipine (NORVASC) 10 MG tablet Take 10 mg by mouth daily.       . clopidogrel (PLAVIX) 75 MG tablet Take 75 mg by mouth daily.       . fluconazole (DIFLUCAN) 100 MG tablet Take 1 tablet (100 mg total) by mouth daily.  8 tablet  0  . HYDROcodone-acetaminophen (NORCO) 10-325 MG per tablet Take 1 tablet by mouth every 6 (six) hours as needed for pain.  30 tablet  0  . lidocaine-prilocaine (EMLA) cream Apply topically as needed. Apply a small amount to the port 1-2  hrs before chemo.  Cover with clear dsg to keep cream from rubbing off.  30 g  0  . lisinopril (PRINIVIL,ZESTRIL) 10 MG tablet Take 10 mg by mouth daily.       . pravastatin (PRAVACHOL) 80 MG tablet Take 80 mg by mouth daily.       . prochlorperazine (COMPAZINE) 10 MG tablet Take 10 mg by mouth every 6 (six) hours as needed. For nausea      . hydrochlorothiazide (HYDRODIURIL) 25 MG tablet Take 25 mg by mouth daily.        Labs:  Lab Results  Component Value Date   WBC 5.1 04/05/2012   HGB 12.2* 04/05/2012   HCT 35.5* 04/05/2012   MCV 95.6 04/05/2012   PLT 224 04/05/2012   Lab Results  Component Value Date   CREATININE 1.1 04/05/2012   BUN 16.0 04/05/2012   NA 146* 04/05/2012   K 3.5 04/05/2012   CL 108* 04/05/2012   CO2 26 04/05/2012   Lab Results  Component Value Date   ALT 8 04/05/2012   AST 13 04/05/2012   PHOS 3.8 02/10/2010   BILITOT 0.38 04/05/2012  Physical Examination:  weight is 116 lb 11.2 oz (52.935 kg). His temperature is 98 F (36.7 C). His blood pressure is 134/102 and his pulse is 79. His respiration is 16.    Wt Readings from Last 3 Encounters:  04/05/12 116 lb 11.2 oz (52.935 kg)  04/05/12 115 lb 4.8 oz (52.3 kg)  03/19/12 115 lb 4.8 oz (52.3 kg)    There is no obvious trauma to the face or head area Lungs - Normal respiratory effort, chest expands symmetrically. Lungs are clear to auscultation, no crackles or wheezes. No point tenderness with palpation Heart has regular rhythm and rate  Abdomen is soft and non tender with normal bowel sounds Examination left ankle reveals no significant swelling. Peripheral pulses are good.  Assessment:  Patient tolerating treatments well except for issues as above  Plan: Continue treatment per original radiation prescription

## 2012-04-06 ENCOUNTER — Telehealth: Payer: Self-pay | Admitting: Internal Medicine

## 2012-04-06 ENCOUNTER — Ambulatory Visit
Admission: RE | Admit: 2012-04-06 | Discharge: 2012-04-06 | Disposition: A | Discharge status: Home / Self Care | Payer: Medicare Other | Source: Ambulatory Visit | Attending: Radiation Oncology | Admitting: Radiation Oncology

## 2012-04-06 ENCOUNTER — Other Ambulatory Visit: Payer: Self-pay | Admitting: *Deleted

## 2012-04-06 ENCOUNTER — Ambulatory Visit: Payer: Medicare Other

## 2012-04-06 MED ORDER — PROCHLORPERAZINE MALEATE 10 MG PO TABS: 10.0000 mg | ORAL_TABLET | Freq: Four times a day (QID) | ORAL | Status: DC | PRN

## 2012-04-06 NOTE — Telephone Encounter (Signed)
appts made and printed for pt  Note to pt to get a new sch today    Benjamin Moss
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
## 2012-04-09 ENCOUNTER — Other Ambulatory Visit: Payer: Medicare Other | Admitting: Lab

## 2012-04-09 ENCOUNTER — Ambulatory Visit: Payer: Medicare Other

## 2012-04-10 ENCOUNTER — Ambulatory Visit: Payer: Medicare Other

## 2012-04-11 ENCOUNTER — Ambulatory Visit: Payer: Medicare Other

## 2012-04-12 ENCOUNTER — Ambulatory Visit: Payer: Medicare Other | Admitting: Radiation Oncology

## 2012-04-12 ENCOUNTER — Ambulatory Visit
Admission: RE | Admit: 2012-04-12 | Discharge: 2012-04-12 | Disposition: A | Discharge status: Home / Self Care | Payer: Medicare Other | Source: Ambulatory Visit | Attending: Radiation Oncology | Admitting: Radiation Oncology

## 2012-04-12 VITALS — BP 98/75 | HR 78 | Temp 98.4°F | Resp 20

## 2012-04-12 DIAGNOSIS — C3490 Malignant neoplasm of unspecified part of unspecified bronchus or lung: Secondary | ICD-10-CM

## 2012-04-12 MED ORDER — MORPHINE SULFATE 4 MG/ML IJ SOLN
4.0000 mg | Freq: Once | INTRAMUSCULAR | Status: AC
  Administered 2012-04-12: 4 mg via INTRAMUSCULAR
  Filled 2012-04-12: qty 1

## 2012-04-12 MED ORDER — HYDROCODONE-ACETAMINOPHEN 10-325 MG PO TABS: 1.0000 | ORAL_TABLET | Freq: Four times a day (QID) | ORAL | Status: DC | PRN

## 2012-04-12 NOTE — Progress Notes (Signed)
[facility]    Radiation Oncology [address] [place]     Maryln Gottron, M.D. [place], [place] [address]               Billie Lade, M.D., Ph.D. Phone: [phone]      Molli Hazard A. Kathrynn Running, M.D. Fax: [phone]      Radene Gunning, M.D., Ph.D.         Lurline Hare, M.D.         Grayland Jack, M.D Weekly Treatment Management Note  Name: Benjamin Moss     MRN: [id number]        CSN: [id number] Date: 04/12/2012      DOB: 01/20/1946  CC: Julieanne Manson, MD         [place]    Status: Outpatient  Diagnosis: The encounter diagnosis was Lung cancer.  Current Dose: 34.2 Gy  Current Fraction: 19  Planned Dose: 63 Gy  Narrative: [name] was seen today for weekly treatment management. The chart was checked and MVCT  were reviewed. He unfortunately has missed several treatments due to transportation issues. Patient does have a lot of pain lying on the table in light of the lack of adipose tissue in the lower back. Patient was given morphine 4 mg to assist in treating him today. He denies any breathing problems.  Review of patient's allergies indicates no known allergies. Current Outpatient Prescriptions  Medication Sig Dispense Refill  . albuterol (PROVENTIL HFA;VENTOLIN HFA) 108 (90 BASE) MCG/ACT inhaler Inhale 2 puffs into the lungs every 6 (six) hours as needed. For shortness of breath  1 Inhaler  0  . amLODipine (NORVASC) 10 MG tablet Take 10 mg by mouth daily.       . clopidogrel (PLAVIX) 75 MG tablet Take 75 mg by mouth daily.       . fluconazole (DIFLUCAN) 100 MG tablet Take 1 tablet (100 mg total) by mouth daily.  8 tablet  0  . hydrochlorothiazide (HYDRODIURIL) 25 MG tablet Take 25 mg by mouth daily.       Marland Kitchen HYDROcodone-acetaminophen (NORCO) 10-325 MG per tablet Take 1 tablet by mouth every 6 (six) hours as needed for pain.  30 tablet  0  . lidocaine-prilocaine (EMLA) cream Apply topically as needed. Apply a small amount to the port 1-2 hrs before chemo.   Cover with clear dsg to keep cream from rubbing off.  30 g  0  . lisinopril (PRINIVIL,ZESTRIL) 10 MG tablet Take 10 mg by mouth daily.       . pravastatin (PRAVACHOL) 80 MG tablet Take 80 mg by mouth daily.       . prochlorperazine (COMPAZINE) 10 MG tablet Take 1 tablet (10 mg total) by mouth every 6 (six) hours as needed. For nausea  30 tablet  1   Labs:  Lab Results  Component Value Date   WBC 5.1 04/05/2012   HGB 12.2* 04/05/2012   HCT 35.5* 04/05/2012   MCV 95.6 04/05/2012   PLT 224 04/05/2012   Lab Results  Component Value Date   CREATININE 1.1 04/05/2012   BUN 16.0 04/05/2012   NA 146* 04/05/2012   K 3.5 04/05/2012   CL 108* 04/05/2012   CO2 26 04/05/2012   Lab Results  Component Value Date   ALT 8 04/05/2012   AST 13 04/05/2012   PHOS 3.8 02/10/2010   BILITOT 0.38 04/05/2012    Physical Examination:  temperature is 98.4 F (36.9 C). His blood pressure  is 98/75 and his pulse is 78. His respiration is 20.    Wt Readings from Last 3 Encounters:  04/05/12 116 lb 11.2 oz (52.935 kg)  04/05/12 115 lb 4.8 oz (52.3 kg)  03/19/12 115 lb 4.8 oz (52.3 kg)    Patient has some discomfort with palpation along the right sacroiliac region. There is no obvious palpable mass. Patient has very minimal adipose tissue throughout his pelvis region. Lungs - Normal respiratory effort, chest expands symmetrically. Lungs are clear to auscultation, no crackles or wheezes.  Heart has regular rhythm and rate  Abdomen is soft and non tender with normal bowel sounds  Assessment:  Patient tolerating treatments well  Plan: Continue treatment per original radiation prescription

## 2012-04-12 NOTE — Progress Notes (Signed)
Patient given 4 mg morphine intramuscularly.Headache has been relieved but continues to have back pain.pain level from 10 to 6 or 8.

## 2012-04-12 NOTE — Progress Notes (Signed)
Patient brought to nursing in w/c before rad tx, stating"pain a 10 in his back" on 1-10 scale, vitals taken, last pain med taken at 8am, put in dressing room 12, notified Dr.Kinard,  Patient to be given Morphine 4mg  IM per verbal order Dr.Kinard,  Val Malloy,RN to give to patient, then patient to go for rad tx and will see Dr.Kinard  After treatment 12:20 PM

## 2012-04-13 ENCOUNTER — Ambulatory Visit: Payer: Medicare Other

## 2012-04-16 ENCOUNTER — Ambulatory Visit (HOSPITAL_BASED_OUTPATIENT_CLINIC_OR_DEPARTMENT_OTHER): Payer: Medicare Other

## 2012-04-16 ENCOUNTER — Telehealth: Payer: Self-pay | Admitting: Radiation Oncology

## 2012-04-16 ENCOUNTER — Ambulatory Visit: Admission: RE | Admit: 2012-04-16 | Payer: Medicare Other | Source: Ambulatory Visit

## 2012-04-16 ENCOUNTER — Other Ambulatory Visit (HOSPITAL_BASED_OUTPATIENT_CLINIC_OR_DEPARTMENT_OTHER): Payer: Medicare Other | Admitting: Lab

## 2012-04-16 VITALS — BP 128/84 | HR 88 | Temp 98.6°F | Resp 20

## 2012-04-16 DIAGNOSIS — C3490 Malignant neoplasm of unspecified part of unspecified bronchus or lung: Secondary | ICD-10-CM

## 2012-04-16 DIAGNOSIS — C3430 Malignant neoplasm of lower lobe, unspecified bronchus or lung: Secondary | ICD-10-CM

## 2012-04-16 DIAGNOSIS — Z5111 Encounter for antineoplastic chemotherapy: Secondary | ICD-10-CM

## 2012-04-16 LAB — CBC WITH DIFFERENTIAL/PLATELET
Eosinophils Absolute: 0.4 10*3/uL (ref 0.0–0.5)
HCT: 35.7 % — ABNORMAL LOW (ref 38.4–49.9)
LYMPH%: 22.2 % (ref 14.0–49.0)
MONO#: 1 10*3/uL — ABNORMAL HIGH (ref 0.1–0.9)
NEUT#: 2.4 10*3/uL (ref 1.5–6.5)
NEUT%: 48 % (ref 39.0–75.0)
Platelets: 217 10*3/uL (ref 140–400)
RBC: 3.78 10*6/uL — ABNORMAL LOW (ref 4.20–5.82)
WBC: 5 10*3/uL (ref 4.0–10.3)
nRBC: 0 % (ref 0–0)

## 2012-04-16 LAB — COMPREHENSIVE METABOLIC PANEL (CC13)
ALT: <6 U/L (ref 0–55)
Albumin: 3.1 g/dL — ABNORMAL LOW (ref 3.5–5.0)
CO2: 28 mEq/L (ref 22–29)
Calcium: 9 mg/dL (ref 8.4–10.4)
Chloride: 104 mEq/L (ref 98–107)
Creatinine: 1 mg/dL (ref 0.7–1.3)

## 2012-04-16 MED ORDER — ONDANSETRON 16 MG/50ML IVPB (CHCC)
16.0000 mg | Freq: Once | INTRAVENOUS | Status: AC
  Administered 2012-04-16: 16 mg via INTRAVENOUS

## 2012-04-16 MED ORDER — DEXAMETHASONE SODIUM PHOSPHATE 10 MG/ML IJ SOLN
20.0000 mg | Freq: Once | INTRAMUSCULAR | Status: AC
  Administered 2012-04-16: 20 mg via INTRAVENOUS

## 2012-04-16 MED ORDER — DIPHENHYDRAMINE HCL 50 MG/ML IJ SOLN
50.0000 mg | Freq: Once | INTRAMUSCULAR | Status: AC
  Administered 2012-04-16: 50 mg via INTRAVENOUS

## 2012-04-16 MED ORDER — SODIUM CHLORIDE 0.9 % IJ SOLN
10.0000 mL | INTRAMUSCULAR | Status: DC | PRN
  Administered 2012-04-16: 10 mL
  Filled 2012-04-16: qty 10

## 2012-04-16 MED ORDER — HEPARIN SOD (PORK) LOCK FLUSH 100 UNIT/ML IV SOLN
500.0000 [IU] | Freq: Once | INTRAVENOUS | Status: AC | PRN
  Administered 2012-04-16: 500 [IU]
  Filled 2012-04-16: qty 5

## 2012-04-16 MED ORDER — SODIUM CHLORIDE 0.9 % IV SOLN
143.6000 mg | Freq: Once | INTRAVENOUS | Status: AC
  Administered 2012-04-16: 140 mg via INTRAVENOUS
  Filled 2012-04-16: qty 14

## 2012-04-16 MED ORDER — PACLITAXEL CHEMO INJECTION 300 MG/50ML
45.0000 mg/m2 | Freq: Once | INTRAVENOUS | Status: AC
  Administered 2012-04-16: 72 mg via INTRAVENOUS
  Filled 2012-04-16: qty 12

## 2012-04-16 MED ORDER — FAMOTIDINE IN NACL 20-0.9 MG/50ML-% IV SOLN
20.0000 mg | Freq: Once | INTRAVENOUS | Status: AC
  Administered 2012-04-16: 20 mg via INTRAVENOUS

## 2012-04-16 MED ORDER — SODIUM CHLORIDE 0.9 % IV SOLN
Freq: Once | INTRAVENOUS | Status: AC
  Administered 2012-04-16: 11:00:00 via INTRAVENOUS

## 2012-04-16 NOTE — Patient Instructions (Signed)
[facility] Discharge Instructions for Patients Receiving Chemotherapy  Today you received the following chemotherapy agents Taxol/Carboplatin  To help prevent nausea and vomiting after your treatment, we encourage you to take your nausea medication   If you develop nausea and vomiting that is not controlled by your nausea medication, call the clinic. If it is after clinic hours your family physician or the after hours number for the clinic or go to the Emergency Department.   BELOW ARE SYMPTOMS THAT SHOULD BE REPORTED IMMEDIATELY:  *FEVER GREATER THAN 100.5 F  *CHILLS WITH OR WITHOUT FEVER  NAUSEA AND VOMITING THAT IS NOT CONTROLLED WITH YOUR NAUSEA MEDICATION  *UNUSUAL SHORTNESS OF BREATH  *UNUSUAL BRUISING OR BLEEDING  TENDERNESS IN MOUTH AND THROAT WITH OR WITHOUT PRESENCE OF ULCERS  *URINARY PROBLEMS  *BOWEL PROBLEMS  UNUSUAL RASH Items with * indicate a potential emergency and should be followed up as soon as possible.  One of the nurses will contact you 24 hours after your treatment. Please let the nurse know about any problems that you may have experienced. Feel free to call the clinic you have any questions or concerns. The clinic phone number is [phone].   I have been informed and understand all the instructions given to me. I know to contact the clinic, my physician, or go to the Emergency Department if any problems should occur. I do not have any questions at this time, but understand that I may call the clinic during office hours   should I have any questions or need assistance in obtaining follow up care.    __________________________________________  _____________  __________ Signature of Patient or Authorized Representative            Date                   Time    __________________________________________ Nurse's Signature    

## 2012-04-16 NOTE — Telephone Encounter (Signed)
Notified by therapist that patient did not show for radiation treatment today. Phoned cell number but, recording reports the number has been disconnected. Phoned home number and left message requesting return call. Routed this message to Dr. Roselind Messier.

## 2012-04-17 ENCOUNTER — Encounter: Payer: Self-pay | Admitting: Radiation Oncology

## 2012-04-17 ENCOUNTER — Ambulatory Visit
Admission: RE | Admit: 2012-04-17 | Discharge: 2012-04-17 | Disposition: A | Discharge status: Home / Self Care | Payer: Medicare Other | Source: Ambulatory Visit | Attending: Radiation Oncology | Admitting: Radiation Oncology

## 2012-04-17 VITALS — BP 95/73 | HR 68 | Resp 16 | Wt 118.8 lb

## 2012-04-17 DIAGNOSIS — C3490 Malignant neoplasm of unspecified part of unspecified bronchus or lung: Secondary | ICD-10-CM

## 2012-04-17 NOTE — Progress Notes (Signed)
Patient presents to the clinic today for PUT with Dr. Roselind Messier. Patient alert and oriented to person, place, and time. No distress noted. Patient being pushed in wheelchair due to generalized weakness but, able to stand on scale of weight without assistance. Pleasant affect noted. Patient reports a headache and low back pain 10 on a scale of 0-10 for which he has taken norco 10/325. Patient reports a dry cough. Patient reports shortness of breath with exertion. Patient denies nausea or vomiting. Patient reports that he had a bowel movement yesterday morning. Reported all findings to Dr. Roselind Messier.

## 2012-04-17 NOTE — Progress Notes (Signed)
[facility]    Radiation Oncology [address] [place]     Maryln Gottron, M.D. [place], [place] [address]               Billie Lade, M.D., Ph.D. Phone: [phone]      Molli Hazard A. Kathrynn Running, M.D. Fax: [phone]      Radene Gunning, M.D., Ph.D.         Lurline Hare, M.D.         Grayland Jack, M.D Weekly Treatment Management Note  Name: Benjamin Moss     MRN: [id number]        CSN: [id number] Date: 04/17/2012      DOB: November 06, 1945  CC: Julieanne Manson, MD         [place]    Status: Outpatient  Diagnosis: The encounter diagnosis was Lung cancer.  Current Dose: 36 Gy  Current Fraction: 20  Planned Dose: 63 Gy  Narrative: [name] was seen today for weekly treatment management. The chart was checked and MVCT  were reviewed. He is tolerating his treatments recently well. He does have some fatigue but denies any significant swallowing problems at this time. he complains of a mild headache and mild back pain today.  He continues to Miss treatments secondary to transportation difficulties.   Review of patient's allergies indicates no known allergies.  Current Outpatient Prescriptions  Medication Sig Dispense Refill  . albuterol (PROVENTIL HFA;VENTOLIN HFA) 108 (90 BASE) MCG/ACT inhaler Inhale 2 puffs into the lungs every 6 (six) hours as needed. For shortness of breath  1 Inhaler  0  . amLODipine (NORVASC) 10 MG tablet Take 10 mg by mouth daily.       . clopidogrel (PLAVIX) 75 MG tablet Take 75 mg by mouth daily.       . fluconazole (DIFLUCAN) 100 MG tablet Take 1 tablet (100 mg total) by mouth daily.  8 tablet  0  . hydrochlorothiazide (HYDRODIURIL) 25 MG tablet Take 25 mg by mouth daily.       Marland Kitchen HYDROcodone-acetaminophen (NORCO) 10-325 MG per tablet Take 1 tablet by mouth every 6 (six) hours as needed for pain.  30 tablet  0  . lidocaine-prilocaine (EMLA) cream Apply topically as needed. Apply a small amount to the port 1-2 hrs before chemo.  Cover with  clear dsg to keep cream from rubbing off.  30 g  0  . lisinopril (PRINIVIL,ZESTRIL) 10 MG tablet Take 10 mg by mouth daily.       . pravastatin (PRAVACHOL) 80 MG tablet Take 80 mg by mouth daily.       . prochlorperazine (COMPAZINE) 10 MG tablet Take 1 tablet (10 mg total) by mouth every 6 (six) hours as needed. For nausea  30 tablet  1   Labs:  Lab Results  Component Value Date   WBC 5.0 04/16/2012   HGB 12.0* 04/16/2012   HCT 35.7* 04/16/2012   MCV 94.4 04/16/2012   PLT 217 04/16/2012   Lab Results  Component Value Date   CREATININE 1.0 04/16/2012   BUN 18.0 04/16/2012   NA 137 04/16/2012   K 3.4* 04/16/2012   CL 104 04/16/2012   CO2 28 04/16/2012   Lab Results  Component Value Date   ALT <6 Repeated and Verified 04/16/2012   AST 10 04/16/2012   PHOS 3.8 02/10/2010   BILITOT 0.27 04/16/2012    Physical Examination:  weight is 118 lb 12.8 oz (53.887 kg). His blood pressure is  95/73 and his pulse is 68. His respiration is 16 and oxygen saturation is 95%.    Wt Readings from Last 3 Encounters:  04/17/12 118 lb 12.8 oz (53.887 kg)  04/05/12 116 lb 11.2 oz (52.935 kg)  04/05/12 115 lb 4.8 oz (52.3 kg)    The oral cavity is moist without secondary infection Lungs - Normal respiratory effort, chest expands symmetrically. Lungs are clear to auscultation, no crackles or wheezes.  Heart has regular rhythm and rate  Abdomen is soft and non tender with normal bowel sounds  Assessment:  Patient tolerating treatments well except for issues as above.  Plan: Continue treatment per original radiation prescription

## 2012-04-18 ENCOUNTER — Ambulatory Visit
Admission: RE | Admit: 2012-04-18 | Discharge: 2012-04-18 | Disposition: A | Discharge status: Home / Self Care | Payer: Medicare Other | Source: Ambulatory Visit | Attending: Radiation Oncology | Admitting: Radiation Oncology

## 2012-04-19 ENCOUNTER — Ambulatory Visit
Admission: RE | Admit: 2012-04-19 | Discharge: 2012-04-19 | Disposition: A | Discharge status: Home / Self Care | Payer: Medicare Other | Source: Ambulatory Visit | Attending: Radiation Oncology | Admitting: Radiation Oncology

## 2012-04-20 ENCOUNTER — Telehealth: Payer: Self-pay | Admitting: *Deleted

## 2012-04-20 ENCOUNTER — Telehealth: Payer: Self-pay | Admitting: Internal Medicine

## 2012-04-20 ENCOUNTER — Other Ambulatory Visit (HOSPITAL_BASED_OUTPATIENT_CLINIC_OR_DEPARTMENT_OTHER): Payer: Medicare Other | Admitting: Lab

## 2012-04-20 ENCOUNTER — Ambulatory Visit: Payer: Medicare Other

## 2012-04-20 ENCOUNTER — Ambulatory Visit
Admission: RE | Admit: 2012-04-20 | Discharge: 2012-04-20 | Disposition: A | Discharge status: Home / Self Care | Payer: Medicare Other | Source: Ambulatory Visit | Attending: Radiation Oncology | Admitting: Radiation Oncology

## 2012-04-20 ENCOUNTER — Ambulatory Visit (HOSPITAL_BASED_OUTPATIENT_CLINIC_OR_DEPARTMENT_OTHER): Payer: Medicare Other | Admitting: Physician Assistant

## 2012-04-20 VITALS — BP 115/85 | HR 76 | Temp 97.0°F | Resp 20 | Ht 66.0 in | Wt 113.4 lb

## 2012-04-20 DIAGNOSIS — C3490 Malignant neoplasm of unspecified part of unspecified bronchus or lung: Secondary | ICD-10-CM

## 2012-04-20 DIAGNOSIS — C3430 Malignant neoplasm of lower lobe, unspecified bronchus or lung: Secondary | ICD-10-CM

## 2012-04-20 DIAGNOSIS — M545 Low back pain: Secondary | ICD-10-CM

## 2012-04-20 DIAGNOSIS — R51 Headache: Secondary | ICD-10-CM

## 2012-04-20 LAB — CBC WITH DIFFERENTIAL/PLATELET
BASO%: 1.6 % (ref 0.0–2.0)
HCT: 36.2 % — ABNORMAL LOW (ref 38.4–49.9)
MCHC: 34.7 g/dL (ref 32.0–36.0)
MONO#: 0.2 10*3/uL (ref 0.1–0.9)
NEUT%: 65.6 % (ref 39.0–75.0)
RBC: 3.84 10*6/uL — ABNORMAL LOW (ref 4.20–5.82)
RDW: 15.8 % — ABNORMAL HIGH (ref 11.0–14.6)
WBC: 2.8 10*3/uL — ABNORMAL LOW (ref 4.0–10.3)
lymph#: 0.5 10*3/uL — ABNORMAL LOW (ref 0.9–3.3)

## 2012-04-20 LAB — COMPREHENSIVE METABOLIC PANEL (CC13)
ALT: 15 U/L (ref 0–55)
Albumin: 3.5 g/dL (ref 3.5–5.0)
CO2: 28 mEq/L (ref 22–29)
Calcium: 9.5 mg/dL (ref 8.4–10.4)
Chloride: 101 mEq/L (ref 98–107)
Potassium: 3.4 mEq/L — ABNORMAL LOW (ref 3.5–5.1)
Sodium: 141 mEq/L (ref 136–145)
Total Protein: 7.5 g/dL (ref 6.4–8.3)

## 2012-04-20 MED ORDER — HYDROCODONE-ACETAMINOPHEN 10-325 MG PO TABS: 1.0000 | ORAL_TABLET | Freq: Four times a day (QID) | ORAL | Status: DC | PRN

## 2012-04-20 NOTE — Telephone Encounter (Signed)
gv and printed appt schedule for pt for Jan....emailed michelle to add tx.... Pt awre

## 2012-04-20 NOTE — Patient Instructions (Addendum)
Continue your course of concurrent chemoradiation as scheduled Followup in 2 weeks 

## 2012-04-20 NOTE — Telephone Encounter (Signed)
Per staff message and POF I have scheduled appts.  JMW  

## 2012-04-22 NOTE — Progress Notes (Signed)
[facility] Telephone:([phone]   [phone]  OFFICE PROGRESS NOTE  Julieanne Manson, MD [address] [place] [address]  DIAGNOSIS: non-small cell lung cancer, squamous cell carcinoma presenting with synchronous tumor as stage IIIA (T2a., N2, M0) in the right lung and questionably stage IA (T1a., N0, M0) in the left lung based on the imaging studies also the final pathology did not show metastatic squamous cell carcinoma in the mediastinal lymph nodes or the left lower lobe mass   PRIOR THERAPY: None   CURRENT THERAPY: Concurrent chemoradiation with weekly carboplatin for AUC of 2 and paclitaxel 45 mg/M2.   INTERVAL HISTORY: Benjamin Moss 67 y.o. male returns to the clinic today for followup visit. He is tolerating his course of concurrent chemotherapy relatively well. He does continue to complain of some headaches not associated with any blurred or double vision or any nausea or vomiting. He complains of back pain a request a refill for his pain medication. He voiced no other specific complaints today. He denied having any significant fever or chills. No nausea or vomiting. He has no significant chest pain, cough or hemoptysis but continues to have shortness breath with exertion.  MEDICAL HISTORY: Past Medical History  Diagnosis Date  . Hyperlipidemia   . Hypertension   . COPD (chronic obstructive pulmonary disease)   . Stroke     left side weakness, ambulates with cane/ walker  . Right lower lobe lung mass   . Hypoxia   . Nodule of left lung   . Lung cancer 01/19/2012    ALLERGIES:   has no known allergies.  MEDICATIONS:  Current Outpatient Prescriptions  Medication Sig Dispense Refill  . amLODipine (NORVASC) 10 MG tablet Take 10 mg by mouth daily.      . clopidogrel (PLAVIX) 75 MG tablet Take 75 mg by mouth daily.      . hydrochlorothiazide (HYDRODIURIL) 25 MG tablet Take 25 mg by mouth daily.      Marland Kitchen  HYDROcodone-acetaminophen (NORCO) 10-325 MG per tablet Take 1 tablet by mouth every 6 (six) hours as needed.  40 tablet  0  . lisinopril (PRINIVIL,ZESTRIL) 10 MG tablet Take 10 mg by mouth daily.        SURGICAL HISTORY:  Past Surgical History  Procedure Date  . Tonsillectomy     REVIEW OF SYSTEMS:  Pertinent items are noted in HPI.   PHYSICAL EXAMINATION: General appearance: alert, cooperative, fatigued and no distress Head: Normocephalic, without obvious abnormality, atraumatic Lymph nodes: Cervical, supraclavicular, and axillary nodes normal. Resp: clear to auscultation bilaterally Cardio: regular rate and rhythm, S1, S2 normal, no murmur, click, rub or gallop GI: soft, non-tender; bowel sounds normal; no masses,  no organomegaly Extremities: extremities normal, atraumatic, no cyanosis or edema Neurologic: Alert and oriented X 3, normal strength and tone. Normal symmetric reflexes. Normal coordination and gait  ECOG PERFORMANCE STATUS: 2 - Symptomatic, <50% confined to bed  Blood pressure 115/85, pulse 76, temperature 97 F (36.1 C), temperature source Oral, resp. rate 20, height 5\' 6"  (1.676 m), weight 113 lb 6.4 oz (51.438 kg).  LABORATORY DATA: Lab Results  Component Value Date   WBC 2.8* 04/20/2012   HGB 12.6* 04/20/2012   HCT 36.2* 04/20/2012   MCV 94.1 04/20/2012   PLT 223 04/20/2012      Chemistry      Component Value Date/Time   NA 141 04/20/2012 0944   NA 138 02/06/2012 0750  K 3.4* 04/20/2012 0944   K 3.2* 02/06/2012 0750   CL 101 04/20/2012 0944   CL 95* 02/06/2012 0750   CO2 28 04/20/2012 0944   CO2 30 02/06/2012 0750   BUN 14.0 04/20/2012 0944   BUN 30* 02/06/2012 0750   CREATININE 1.1 04/20/2012 0944   CREATININE 1.19 02/06/2012 0750      Component Value Date/Time   CALCIUM 9.5 04/20/2012 0944   CALCIUM 10.5 02/06/2012 0750   ALKPHOS 70 04/20/2012 0944   ALKPHOS 99 02/06/2012 0750   AST 18 04/20/2012 0944   AST 26 02/06/2012 0750   ALT 15 04/20/2012 0944   ALT 19  02/06/2012 0750   BILITOT 0.68 04/20/2012 0944   BILITOT 0.8 02/06/2012 0750       RADIOGRAPHIC STUDIES: Ir Fluoro Guide Cv Line Right  02/06/2012  *RADIOLOGY REPORT*  Indication: History of lung cancer, in need of intravenous access for chemotherapy administration.  IMPLANTED PORT A CATH PLACEMENT WITH ULTRASOUND AND FLUOROSCOPIC GUIDANCE  Sedation: Versed 1 mg IV; Fentanyl 100 mcg IV; Ancef 1 gm IV; IV antibiotic was given in an appropriate time interval prior to skin puncture.  Total Moderate Sedation Time: 25 minutes.  Contrast: None  Fluoroscopy Time: 0.6 minutes.  Complications: None immediate  Procedure:  The procedure, risks, benefits, and alternatives were explained to the patient.  Questions regarding the procedure were encouraged and answered.  The patient understands and consents to the procedure.  The right neck and chest were prepped with chlorhexidine in a sterile fashion, and a sterile drape was applied covering the operative field.  Maximum barrier sterile technique with sterile gowns and gloves were used for the procedure.  A timeout was performed prior to the initiation of the procedure.  Local anesthesia was provided with 1% lidocaine with epinephrine.  After creating a small venotomy incision, a micropuncture kit was utilized to access the right internal jugular vein under direct, real-time ultrasound guidance.  Ultrasound image documentation was performed.  The microwire was kinked to measure appropriate catheter length.  A subcutaneous port pocket was then created along the upper chest wall utilizing a combination of sharp and blunt dissection.  The pocket was irrigated with sterile saline.  A single lumen ISP power injectable port was chosen for placement.  The 8 Fr catheter was tunneled from the port pocket site to the venotomy incision.  The port was placed in the pocket.  The external catheter was trimmed to appropriate length.  At the venotomy, an 8 Fr peel-away sheath was placed  over a guidewire under fluoroscopic guidance.  The catheter was then placed through the sheath and the sheath was removed.  Final catheter positioning was confirmed and documented with a fluoroscopic spot radiograph.  The port was accessed with a Huber needle, aspirated and flushed with heparinized saline.  The venotomy site was closed with an interrupted 4-0 Vicryl suture. The port pocket incision was closed with interrupted 2-0 Vicryl suture and the skin was opposed with a running subcuticular 4-0 Vicryl suture.  Dermabond and Steri-strips were applied to both incisions.  Dressings were placed.  The patient tolerated the procedure well without immediate post procedural complication.  Findings:  After catheter placement, the tip lies at the superior cavoatrial junction. The catheter aspirates and flushes normally and is ready for immediate use.  IMPRESSION:  Successful placement of a right internal jugular approach single lumen power injectable Port-A-Cath.  The catheter is ready for immediate use.   Original Report Authenticated By:  Tacey Ruiz, MD    ASSESSMENT/PLAN: This is a very pleasant 67 years old African American male with a synchronous lung cancer presenting as stage IIIa non-small cell lung cancer and the right lung as well as a stage IA at the left lung base. He is currently undergoing concurrent chemoradiation. The patient was discussed withDr. Darrold Span in Dr. Asa Lente absence. He'll continue his course of concurrent chemoradiation as scheduled. He was given a prescription for his Norco 10/325, one tablet by mouth every 6 hours as needed for pain a total 40 tablets with no refill. He'll follow with Dr. Arbutus Ped in 2 weeks for another symptom management visit. He will have a CBC differential and C. met at that visit.   Laural Benes, Teshawn Moan E, PA-C  All questions were answered. The patient knows to call the clinic with any problems, questions or concerns. We can certainly see the patient much sooner if  necessary.  I spent 20 minutes counseling the patient face to face. The total time spent in the appointment was 30 minutes.

## 2012-04-23 ENCOUNTER — Ambulatory Visit
Admission: RE | Admit: 2012-04-23 | Discharge: 2012-04-23 | Disposition: A | Discharge status: Home / Self Care | Payer: Medicare Other | Source: Ambulatory Visit | Attending: Radiation Oncology | Admitting: Radiation Oncology

## 2012-04-23 ENCOUNTER — Other Ambulatory Visit (HOSPITAL_BASED_OUTPATIENT_CLINIC_OR_DEPARTMENT_OTHER): Payer: Medicare Other | Admitting: Lab

## 2012-04-23 ENCOUNTER — Ambulatory Visit: Payer: Medicare Other

## 2012-04-23 ENCOUNTER — Ambulatory Visit (HOSPITAL_BASED_OUTPATIENT_CLINIC_OR_DEPARTMENT_OTHER): Payer: Medicare Other

## 2012-04-23 VITALS — BP 92/71 | HR 81 | Temp 98.6°F | Resp 20

## 2012-04-23 DIAGNOSIS — C3430 Malignant neoplasm of lower lobe, unspecified bronchus or lung: Secondary | ICD-10-CM

## 2012-04-23 DIAGNOSIS — C3490 Malignant neoplasm of unspecified part of unspecified bronchus or lung: Secondary | ICD-10-CM

## 2012-04-23 DIAGNOSIS — Z5111 Encounter for antineoplastic chemotherapy: Secondary | ICD-10-CM

## 2012-04-23 LAB — COMPREHENSIVE METABOLIC PANEL (CC13)
ALT: 13 U/L (ref 0–55)
AST: 15 U/L (ref 5–34)
Alkaline Phosphatase: 72 U/L (ref 40–150)
Calcium: 9.6 mg/dL (ref 8.4–10.4)
Chloride: 102 mEq/L (ref 98–107)
Creatinine: 1.1 mg/dL (ref 0.7–1.3)
Potassium: 3.2 mEq/L — ABNORMAL LOW (ref 3.5–5.1)

## 2012-04-23 LAB — CBC WITH DIFFERENTIAL/PLATELET
BASO%: 2.1 % — ABNORMAL HIGH (ref 0.0–2.0)
EOS%: 5.6 % (ref 0.0–7.0)
LYMPH%: 20.5 % (ref 14.0–49.0)
MCH: 31.7 pg (ref 27.2–33.4)
MCHC: 34.3 g/dL (ref 32.0–36.0)
MONO#: 0.4 10*3/uL (ref 0.1–0.9)
RBC: 3.56 10*6/uL — ABNORMAL LOW (ref 4.20–5.82)
WBC: 3.8 10*3/uL — ABNORMAL LOW (ref 4.0–10.3)
lymph#: 0.8 10*3/uL — ABNORMAL LOW (ref 0.9–3.3)
nRBC: 0 % (ref 0–0)

## 2012-04-23 MED ORDER — SODIUM CHLORIDE 0.9 % IV SOLN
143.6000 mg | Freq: Once | INTRAVENOUS | Status: AC
  Administered 2012-04-23: 140 mg via INTRAVENOUS
  Filled 2012-04-23: qty 14

## 2012-04-23 MED ORDER — DIPHENHYDRAMINE HCL 50 MG/ML IJ SOLN
50.0000 mg | Freq: Once | INTRAMUSCULAR | Status: AC
  Administered 2012-04-23: 50 mg via INTRAVENOUS

## 2012-04-23 MED ORDER — ONDANSETRON 16 MG/50ML IVPB (CHCC)
16.0000 mg | Freq: Once | INTRAVENOUS | Status: AC
  Administered 2012-04-23: 16 mg via INTRAVENOUS

## 2012-04-23 MED ORDER — DEXAMETHASONE SODIUM PHOSPHATE 4 MG/ML IJ SOLN
20.0000 mg | Freq: Once | INTRAMUSCULAR | Status: AC
  Administered 2012-04-23: 20 mg via INTRAVENOUS

## 2012-04-23 MED ORDER — SODIUM CHLORIDE 0.9 % IV SOLN
Freq: Once | INTRAVENOUS | Status: AC
  Administered 2012-04-23: 09:00:00 via INTRAVENOUS

## 2012-04-23 MED ORDER — PACLITAXEL CHEMO INJECTION 300 MG/50ML
45.0000 mg/m2 | Freq: Once | INTRAVENOUS | Status: AC
  Administered 2012-04-23: 72 mg via INTRAVENOUS
  Filled 2012-04-23: qty 12

## 2012-04-23 MED ORDER — FAMOTIDINE IN NACL 20-0.9 MG/50ML-% IV SOLN
20.0000 mg | Freq: Once | INTRAVENOUS | Status: AC
  Administered 2012-04-23: 20 mg via INTRAVENOUS

## 2012-04-23 NOTE — Patient Instructions (Signed)
[facility] Discharge Instructions for Patients Receiving Chemotherapy  Today you received the following chemotherapy agents Taxol and Carboplatin.  To help prevent nausea and vomiting after your treatment, we encourage you to take your nausea medication.   If you develop nausea and vomiting that is not controlled by your nausea medication, call the clinic.   BELOW ARE SYMPTOMS THAT SHOULD BE REPORTED IMMEDIATELY:  *FEVER GREATER THAN 100.5 F  *CHILLS WITH OR WITHOUT FEVER  NAUSEA AND VOMITING THAT IS NOT CONTROLLED WITH YOUR NAUSEA MEDICATION  *UNUSUAL SHORTNESS OF BREATH  *UNUSUAL BRUISING OR BLEEDING  TENDERNESS IN MOUTH AND THROAT WITH OR WITHOUT PRESENCE OF ULCERS  *URINARY PROBLEMS  *BOWEL PROBLEMS  UNUSUAL RASH Items with * indicate a potential emergency and should be followed up as soon as possible.  Feel free to call the clinic you have any questions or concerns. The clinic phone number is [phone].    

## 2012-04-23 NOTE — Progress Notes (Signed)
Quick Note:  Call patient with the result and order K. dur 20 mEq by mouth daily x7 days ______

## 2012-04-24 ENCOUNTER — Encounter: Payer: Self-pay | Admitting: Radiation Oncology

## 2012-04-24 ENCOUNTER — Ambulatory Visit
Admission: RE | Admit: 2012-04-24 | Discharge: 2012-04-24 | Disposition: A | Discharge status: Home / Self Care | Payer: Medicare Other | Source: Ambulatory Visit | Attending: Radiation Oncology | Admitting: Radiation Oncology

## 2012-04-24 VITALS — BP 93/68 | HR 70 | Resp 18 | Wt 119.8 lb

## 2012-04-24 DIAGNOSIS — C3490 Malignant neoplasm of unspecified part of unspecified bronchus or lung: Secondary | ICD-10-CM

## 2012-04-24 NOTE — Progress Notes (Signed)
Patient presents to the clinic today unaccompanied for PUT with Dr. Roselind Messier. Patient alert and oriented to person, place, and time. No distress noted. Slow steady gait noted with assistance of cane. Patient denies pain at this time. Blood pressure WDL. Left upper extremity remain flaccid resulting from effects of stroke. Patient reports that he plan to get his pain medication and blood pressure medication filled Friday when he picks up his check but, reports he has enough of all medications to last until then. Patient reports an occasional dry cough. Patient report SOB with exertion. Patient denies pain with swallowing. Patient reports occasional difficulty swallowing. Patient reports when food gets "stuck in his throat he drinks water to get it to go down." Patient denies skin changes within treatment field. Reported all findings to Dr. Roselind Messier.

## 2012-04-24 NOTE — Progress Notes (Signed)
[facility]    Radiation Oncology [address] [place]     Maryln Gottron, M.D. [place], [place] [address]               Billie Lade, M.D., Ph.D. Phone: [phone]      Molli Hazard A. Kathrynn Running, M.D. Fax: [phone]      Radene Gunning, M.D., Ph.D.         Lurline Hare, M.D.         Grayland Jack, M.D Weekly Treatment Management Note  Name: Benjamin Moss     MRN: [id number]        CSN: [id number] Date: 04/24/2012      DOB: [birth year]  CC: Julieanne Manson, MD         [place]    Status: Outpatient  Diagnosis: The encounter diagnosis was Lung cancer.  Current Dose: 45 Gy  Current Fraction: 25/35  Planned Dose: 63 Gy  Narrative: Benjamin Moss was seen today for weekly treatment management. The chart was checked and MVCT  were reviewed. He seems to be doing well this week. He has occasional swallowing difficulties and pain with swallowing. He does have hydrocodone given to him by medical oncology. He is breathing well and wishes to have his oxygen removed from his home.  I have suggested he wait a little  before he have this removed.  Review of patient's allergies indicates no known allergies.  Current Outpatient Prescriptions  Medication Sig Dispense Refill  . amLODipine (NORVASC) 10 MG tablet Take 10 mg by mouth daily.      . clopidogrel (PLAVIX) 75 MG tablet Take 75 mg by mouth daily.      . hydrochlorothiazide (HYDRODIURIL) 25 MG tablet Take 25 mg by mouth daily.      Marland Kitchen lisinopril (PRINIVIL,ZESTRIL) 10 MG tablet Take 10 mg by mouth daily.      Marland Kitchen HYDROcodone-acetaminophen (NORCO) 10-325 MG per tablet Take 1 tablet by mouth every 6 (six) hours as needed.  40 tablet  0   Labs:  Lab Results  Component Value Date   WBC 3.8* 04/23/2012   HGB 11.3* 04/23/2012   HCT 32.9* 04/23/2012   MCV 92.4 04/23/2012   PLT 213 04/23/2012   Lab Results  Component Value Date   CREATININE 1.1 04/23/2012   BUN 16.0 04/23/2012   NA 140 04/23/2012   K 3.2* 04/23/2012   CL 102  04/23/2012   CO2 28 04/23/2012   Lab Results  Component Value Date   ALT 13 04/23/2012   AST 15 04/23/2012   PHOS 3.8 02/10/2010   BILITOT 0.41 04/23/2012    Physical Examination:  weight is 119 lb 12.8 oz (54.341 kg). His blood pressure is 93/68 and his pulse is 70. His respiration is 18 and oxygen saturation is 95%.    Wt Readings from Last 3 Encounters:  04/24/12 119 lb 12.8 oz (54.341 kg)  04/20/12 113 lb 6.4 oz (51.438 kg)  04/17/12 118 lb 12.8 oz (53.887 kg)    The oral cavity is moist without secondary infection. Lungs - Normal respiratory effort, chest expands symmetrically. Lungs are clear to auscultation, no crackles or wheezes.  Heart has regular rhythm and rate  Abdomen is soft and non tender with normal bowel sounds  Assessment:  Patient tolerating treatments well  Plan: Continue treatment per original radiation prescription

## 2012-04-25 ENCOUNTER — Ambulatory Visit: Payer: Medicare Other

## 2012-04-25 ENCOUNTER — Telehealth: Payer: Self-pay | Admitting: Medical Oncology

## 2012-04-25 DIAGNOSIS — E876 Hypokalemia: Secondary | ICD-10-CM

## 2012-04-25 MED ORDER — POTASSIUM CHLORIDE CRYS ER 20 MEQ PO TBCR: 20.0000 meq | EXTENDED_RELEASE_TABLET | Freq: Every day | ORAL | Status: DC

## 2012-04-25 NOTE — Telephone Encounter (Signed)
Message copied by Charma Igo on Wed Apr 25, 2012  9:15 AM ------      Message from: Benjamin Moss      Created: Mon Apr 23, 2012  7:24 PM       Call patient with the result and order K. dur 20 mEq by mouth daily x7 days

## 2012-04-25 NOTE — Telephone Encounter (Signed)
I did not get an answer on pts phone . Ileft message for his neighbor to tell pt to pick up kdur.

## 2012-04-26 ENCOUNTER — Ambulatory Visit
Admission: RE | Admit: 2012-04-26 | Discharge: 2012-04-26 | Disposition: A | Discharge status: Home / Self Care | Payer: Medicare Other | Source: Ambulatory Visit | Attending: Radiation Oncology | Admitting: Radiation Oncology

## 2012-04-26 DIAGNOSIS — Z510 Encounter for antineoplastic radiation therapy: Secondary | ICD-10-CM | POA: Insufficient documentation

## 2012-04-26 DIAGNOSIS — Z79899 Other long term (current) drug therapy: Secondary | ICD-10-CM | POA: Insufficient documentation

## 2012-04-26 DIAGNOSIS — C3490 Malignant neoplasm of unspecified part of unspecified bronchus or lung: Secondary | ICD-10-CM | POA: Insufficient documentation

## 2012-04-27 ENCOUNTER — Ambulatory Visit
Admission: RE | Admit: 2012-04-27 | Discharge: 2012-04-27 | Disposition: A | Discharge status: Home / Self Care | Payer: Medicare Other | Source: Ambulatory Visit | Attending: Radiation Oncology | Admitting: Radiation Oncology

## 2012-04-30 ENCOUNTER — Other Ambulatory Visit (HOSPITAL_BASED_OUTPATIENT_CLINIC_OR_DEPARTMENT_OTHER): Payer: Medicare Other | Admitting: Lab

## 2012-04-30 ENCOUNTER — Encounter: Payer: Self-pay | Admitting: Internal Medicine

## 2012-04-30 ENCOUNTER — Ambulatory Visit (HOSPITAL_BASED_OUTPATIENT_CLINIC_OR_DEPARTMENT_OTHER): Payer: Medicare Other

## 2012-04-30 ENCOUNTER — Ambulatory Visit: Payer: Medicare Other | Admitting: Internal Medicine

## 2012-04-30 ENCOUNTER — Ambulatory Visit (HOSPITAL_BASED_OUTPATIENT_CLINIC_OR_DEPARTMENT_OTHER): Payer: Medicare Other | Admitting: Internal Medicine

## 2012-04-30 ENCOUNTER — Ambulatory Visit: Payer: Medicare Other

## 2012-04-30 ENCOUNTER — Ambulatory Visit
Admission: RE | Admit: 2012-04-30 | Discharge: 2012-04-30 | Disposition: A | Discharge status: Home / Self Care | Payer: Medicare Other | Source: Ambulatory Visit | Attending: Radiation Oncology | Admitting: Radiation Oncology

## 2012-04-30 ENCOUNTER — Telehealth: Payer: Self-pay | Admitting: Internal Medicine

## 2012-04-30 ENCOUNTER — Other Ambulatory Visit: Payer: Medicare Other | Admitting: Lab

## 2012-04-30 VITALS — BP 97/75 | HR 89 | Temp 98.6°F | Resp 18 | Ht 66.0 in | Wt 115.6 lb

## 2012-04-30 DIAGNOSIS — C3490 Malignant neoplasm of unspecified part of unspecified bronchus or lung: Secondary | ICD-10-CM

## 2012-04-30 DIAGNOSIS — C3430 Malignant neoplasm of lower lobe, unspecified bronchus or lung: Secondary | ICD-10-CM

## 2012-04-30 DIAGNOSIS — Z5111 Encounter for antineoplastic chemotherapy: Secondary | ICD-10-CM

## 2012-04-30 LAB — CBC WITH DIFFERENTIAL/PLATELET
Eosinophils Absolute: 0.1 10*3/uL (ref 0.0–0.5)
MONO#: 0.5 10*3/uL (ref 0.1–0.9)
NEUT#: 1.8 10*3/uL (ref 1.5–6.5)
Platelets: 167 10*3/uL (ref 140–400)
RBC: 3.57 10*6/uL — ABNORMAL LOW (ref 4.20–5.82)
RDW: 15.3 % — ABNORMAL HIGH (ref 11.0–14.6)
WBC: 3.1 10*3/uL — ABNORMAL LOW (ref 4.0–10.3)
nRBC: 0 % (ref 0–0)

## 2012-04-30 LAB — COMPREHENSIVE METABOLIC PANEL (CC13)
ALT: 10 U/L (ref 0–55)
CO2: 24 mEq/L (ref 22–29)
Calcium: 9.5 mg/dL (ref 8.4–10.4)
Chloride: 104 mEq/L (ref 98–107)
Sodium: 139 mEq/L (ref 136–145)
Total Protein: 7.5 g/dL (ref 6.4–8.3)

## 2012-04-30 MED ORDER — DIPHENHYDRAMINE HCL 50 MG/ML IJ SOLN
50.0000 mg | Freq: Once | INTRAMUSCULAR | Status: AC
  Administered 2012-04-30: 50 mg via INTRAVENOUS

## 2012-04-30 MED ORDER — SODIUM CHLORIDE 0.9 % IJ SOLN
10.0000 mL | INTRAMUSCULAR | Status: DC | PRN
  Administered 2012-04-30: 10 mL
  Filled 2012-04-30: qty 10

## 2012-04-30 MED ORDER — ONDANSETRON 16 MG/50ML IVPB (CHCC)
16.0000 mg | Freq: Once | INTRAVENOUS | Status: AC
  Administered 2012-04-30: 16 mg via INTRAVENOUS

## 2012-04-30 MED ORDER — HEPARIN SOD (PORK) LOCK FLUSH 100 UNIT/ML IV SOLN
500.0000 [IU] | Freq: Once | INTRAVENOUS | Status: AC | PRN
  Administered 2012-04-30: 500 [IU]
  Filled 2012-04-30: qty 5

## 2012-04-30 MED ORDER — PACLITAXEL CHEMO INJECTION 300 MG/50ML
45.0000 mg/m2 | Freq: Once | INTRAVENOUS | Status: AC
  Administered 2012-04-30: 72 mg via INTRAVENOUS
  Filled 2012-04-30: qty 12

## 2012-04-30 MED ORDER — SODIUM CHLORIDE 0.9 % IV SOLN
143.6000 mg | Freq: Once | INTRAVENOUS | Status: AC
  Administered 2012-04-30: 140 mg via INTRAVENOUS
  Filled 2012-04-30: qty 14

## 2012-04-30 MED ORDER — SODIUM CHLORIDE 0.9 % IV SOLN
Freq: Once | INTRAVENOUS | Status: AC
  Administered 2012-04-30: 12:00:00 via INTRAVENOUS

## 2012-04-30 MED ORDER — FAMOTIDINE IN NACL 20-0.9 MG/50ML-% IV SOLN
20.0000 mg | Freq: Once | INTRAVENOUS | Status: AC
  Administered 2012-04-30: 20 mg via INTRAVENOUS

## 2012-04-30 MED ORDER — DEXAMETHASONE SODIUM PHOSPHATE 4 MG/ML IJ SOLN
20.0000 mg | Freq: Once | INTRAMUSCULAR | Status: AC
  Administered 2012-04-30: 20 mg via INTRAVENOUS

## 2012-04-30 NOTE — Progress Notes (Signed)
[facility] [phone]   Fax:([phone]  OFFICE PROGRESS NOTE  Julieanne Manson, MD [address] [place] [address]  DIAGNOSIS: non-small cell lung cancer, squamous cell carcinoma presenting with synchronous tumor as stage IIIA (T2a., N2, M0) in the right lung and questionably stage IA (T1a., N0, M0) in the left lung based on the imaging studies also the final pathology did not show metastatic squamous cell carcinoma in the mediastinal lymph nodes or the left lower lobe mass   PRIOR THERAPY: None   CURRENT THERAPY: Concurrent chemoradiation with weekly carboplatin for AUC of 2 and paclitaxel 45 mg/M2.   INTERVAL HISTORY: Benjamin Moss 67 y.o. male returns to the clinic today for followup visit. The patient is feeling fine with no specific complaints today. He denied having any significant chest pain but continues to have shortness breath with exertion, no cough or hemoptysis. He does not use his home oxygen anymore. He denied having any significant weight loss or night sweats. He is tolerating his concurrent chemoradiation fairly well with no significant adverse effects. He is expected to complete his concurrent chemoradiation this week.  MEDICAL HISTORY: Past Medical History  Diagnosis Date  . Hyperlipidemia   . Hypertension   . COPD (chronic obstructive pulmonary disease)   . Stroke     left side weakness, ambulates with cane/ walker  . Right lower lobe lung mass   . Hypoxia   . Nodule of left lung   . Lung cancer 01/19/2012    ALLERGIES:   has no known allergies.  MEDICATIONS:  Current Outpatient Prescriptions  Medication Sig Dispense Refill  . amLODipine (NORVASC) 10 MG tablet Take 10 mg by mouth daily.      . clopidogrel (PLAVIX) 75 MG tablet Take 75 mg by mouth daily.      . hydrochlorothiazide (HYDRODIURIL) 25 MG tablet Take 25 mg by mouth daily.      Marland Kitchen lisinopril (PRINIVIL,ZESTRIL) 10 MG tablet Take 10 mg by  mouth daily.      . potassium chloride SA (K-DUR,KLOR-CON) 20 MEQ tablet Take 1 tablet (20 mEq total) by mouth daily.  7 tablet  0  . HYDROcodone-acetaminophen (NORCO) 10-325 MG per tablet Take 1 tablet by mouth every 6 (six) hours as needed.  40 tablet  0    SURGICAL HISTORY:  Past Surgical History  Procedure Date  . Tonsillectomy     REVIEW OF SYSTEMS:  A comprehensive review of systems was negative except for: Respiratory: positive for dyspnea on exertion   PHYSICAL EXAMINATION: General appearance: alert, cooperative and no distress Head: Normocephalic, without obvious abnormality, atraumatic Neck: no adenopathy Lymph nodes: Cervical, supraclavicular, and axillary nodes normal. Resp: clear to auscultation bilaterally Cardio: regular rate and rhythm, S1, S2 normal, no murmur, click, rub or gallop GI: soft, non-tender; bowel sounds normal; no masses,  no organomegaly Extremities: extremities normal, atraumatic, no cyanosis or edema  ECOG PERFORMANCE STATUS: 1 - Symptomatic but completely ambulatory  Blood pressure 97/75, pulse 89, temperature 98.6 F (37 C), temperature source Oral, resp. rate 18, height 5\' 6"  (1.676 m), weight 115 lb 9.6 oz (52.436 kg).  LABORATORY DATA: Lab Results  Component Value Date   WBC 3.1* 04/30/2012   HGB 11.4* 04/30/2012   HCT 33.3* 04/30/2012   MCV 93.3 04/30/2012   PLT 167 04/30/2012      Chemistry      Component Value Date/Time   NA 140 04/23/2012 0844  NA 138 02/06/2012 0750   K 3.2* 04/23/2012 0844   K 3.2* 02/06/2012 0750   CL 102 04/23/2012 0844   CL 95* 02/06/2012 0750   CO2 28 04/23/2012 0844   CO2 30 02/06/2012 0750   BUN 16.0 04/23/2012 0844   BUN 30* 02/06/2012 0750   CREATININE 1.1 04/23/2012 0844   CREATININE 1.19 02/06/2012 0750      Component Value Date/Time   CALCIUM 9.6 04/23/2012 0844   CALCIUM 10.5 02/06/2012 0750   ALKPHOS 72 04/23/2012 0844   ALKPHOS 99 02/06/2012 0750   AST 15 04/23/2012 0844   AST 26 02/06/2012 0750   ALT 13  04/23/2012 0844   ALT 19 02/06/2012 0750   BILITOT 0.41 04/23/2012 0844   BILITOT 0.8 02/06/2012 0750       RADIOGRAPHIC STUDIES: No results found.  ASSESSMENT: This is a very pleasant 67 years old [place] American male with history of stage IIIa non-small cell lung cancer currently undergoing concurrent chemoradiation with weekly carboplatin and paclitaxel. The patient expected to complete the course of concurrent chemoradiation this week. He is currently asymptomatic.   PLAN: We'll proceed with his concurrent chemoradiation as scheduled this week. I would see the patient back for followup visit in one month with repeat CT scan of the chest for evaluation of his disease. He was advised to call me immediately if he has any concerning symptoms in the interval.  All questions were answered. The patient knows to call the clinic with any problems, questions or concerns. We can certainly see the patient much sooner if necessary.  I spent 15 minutes counseling the patient face to face. The total time spent in the appointment was 25 minutes.

## 2012-04-30 NOTE — Patient Instructions (Addendum)
[facility] Discharge Instructions for Patients Receiving Chemotherapy  Today you received the following chemotherapy agents Taxol and Carboplatin.  To help prevent nausea and vomiting after your treatment, we encourage you to take your nausea medication as ordered per MD.    If you develop nausea and vomiting that is not controlled by your nausea medication, call the clinic. If it is after clinic hours your family physician or the after hours number for the clinic or go to the Emergency Department.   BELOW ARE SYMPTOMS THAT SHOULD BE REPORTED IMMEDIATELY:  *FEVER GREATER THAN 100.5 F  *CHILLS WITH OR WITHOUT FEVER  NAUSEA AND VOMITING THAT IS NOT CONTROLLED WITH YOUR NAUSEA MEDICATION  *UNUSUAL SHORTNESS OF BREATH  *UNUSUAL BRUISING OR BLEEDING  TENDERNESS IN MOUTH AND THROAT WITH OR WITHOUT PRESENCE OF ULCERS  *URINARY PROBLEMS  *BOWEL PROBLEMS  UNUSUAL RASH Items with * indicate a potential emergency and should be followed up as soon as possible.   Please let the nurse know about any problems that you may have experienced. Feel free to call the clinic you have any questions or concerns. The clinic phone number is [phone].   I have been informed and understand all the instructions given to me. I know to contact the clinic, my physician, or go to the Emergency Department if any problems should occur. I do not have any questions at this time, but understand that I may call the clinic during office hours   should I have any questions or need assistance in obtaining follow up care.    __________________________________________  _____________  __________ Signature of Patient or Authorized Representative            Date                   Time    __________________________________________ Nurse's Signature

## 2012-04-30 NOTE — Patient Instructions (Signed)
Continue chemotherapy and radiation as scheduled. Followup in one month with repeat CT scan of the chest.

## 2012-04-30 NOTE — Telephone Encounter (Signed)
Gave pt appt for MArch 2014 lab before CT then see MD on 06/06/12

## 2012-05-01 ENCOUNTER — Ambulatory Visit
Admission: RE | Admit: 2012-05-01 | Discharge: 2012-05-01 | Disposition: A | Discharge status: Home / Self Care | Payer: Medicare Other | Source: Ambulatory Visit | Attending: Radiation Oncology | Admitting: Radiation Oncology

## 2012-05-01 ENCOUNTER — Ambulatory Visit: Payer: Medicare Other

## 2012-05-01 VITALS — BP 113/82 | HR 77 | Temp 97.6°F | Wt 120.1 lb

## 2012-05-01 DIAGNOSIS — C3490 Malignant neoplasm of unspecified part of unspecified bronchus or lung: Secondary | ICD-10-CM

## 2012-05-01 NOTE — Progress Notes (Signed)
Patient for weekly assessment of right lung cancer radiation.Completed 29 of 35 treatments.Mild headache.Denies shortness of breath or cough.No fatigue.Has some hyperpigmentation of skin, will give radiaples to apply twice daily.

## 2012-05-01 NOTE — Progress Notes (Signed)
[facility]    Radiation Oncology [address] [place]     Maryln Gottron, M.D. [place], [place] [address]               Billie Lade, M.D., Ph.D. Phone: [phone]      Molli Hazard A. Kathrynn Running, M.D. Fax: [phone]      Radene Gunning, M.D., Ph.D.         Lurline Hare, M.D.         Grayland Jack, M.D Weekly Treatment Management Note  Name: Benjamin Moss     MRN: [id number]        CSN: [id number] Date: 05/01/2012      DOB: Jul 20, 1945  CC: Julieanne Manson, MD         [place]    Status: Outpatient  Diagnosis: The encounter diagnosis was Lung cancer.stage IIIA (T2a., N2, M0)   Current Dose: 52.2 Gy  Current Fraction: 29  Planned Dose: 63 Gy  Narrative: [name] was seen today for weekly treatment management. The chart was checked and MVCT  were reviewed. He continues to tolerate his treatments well this time. The has some mild itching in the skin of the radiation portals and we have given him radiaplex for his skin. Patient denies any swallowing difficulties.  Review of patient's allergies indicates no known allergies.  Current Outpatient Prescriptions  Medication Sig Dispense Refill  . amLODipine (NORVASC) 10 MG tablet Take 10 mg by mouth daily.      . clopidogrel (PLAVIX) 75 MG tablet Take 75 mg by mouth daily.      . hydrochlorothiazide (HYDRODIURIL) 25 MG tablet Take 25 mg by mouth daily.      Marland Kitchen HYDROcodone-acetaminophen (NORCO) 10-325 MG per tablet Take 1 tablet by mouth every 6 (six) hours as needed.  40 tablet  0  . lisinopril (PRINIVIL,ZESTRIL) 10 MG tablet Take 10 mg by mouth daily.      . potassium chloride SA (K-DUR,KLOR-CON) 20 MEQ tablet Take 1 tablet (20 mEq total) by mouth daily.  7 tablet  0   Labs:  Lab Results  Component Value Date   WBC 3.1* 04/30/2012   HGB 11.4* 04/30/2012   HCT 33.3* 04/30/2012   MCV 93.3 04/30/2012   PLT 167 04/30/2012   Lab Results  Component Value Date   CREATININE 1.2 04/30/2012   BUN 21.9 04/30/2012   NA  139 04/30/2012   K 4.0 04/30/2012   CL 104 04/30/2012   CO2 24 04/30/2012   Lab Results  Component Value Date   ALT 10 04/30/2012   AST 15 04/30/2012   PHOS 3.8 02/10/2010   BILITOT 0.30 04/30/2012    Physical Examination:  weight is 120 lb 1.6 oz (54.477 kg). His temperature is 97.6 F (36.4 C). His blood pressure is 113/82 and his pulse is 77. His oxygen saturation is 98%.    Wt Readings from Last 3 Encounters:  05/01/12 120 lb 1.6 oz (54.477 kg)  04/30/12 115 lb 9.6 oz (52.436 kg)  04/24/12 119 lb 12.8 oz (54.341 kg)    The skin along the chest front and back shows some hyperpigmentation changes but no skin breakdown is appreciated Lungs - Normal respiratory effort, chest expands symmetrically. Lungs are clear to auscultation, no crackles or wheezes.  Heart has regular rhythm and rate  Abdomen is soft and non tender with normal bowel sounds  Assessment:  Patient tolerating treatments well  Plan: Continue treatment per original radiation prescription for 6  additional treatments

## 2012-05-02 ENCOUNTER — Ambulatory Visit
Admission: RE | Admit: 2012-05-02 | Discharge: 2012-05-02 | Disposition: A | Discharge status: Home / Self Care | Payer: Medicare Other | Source: Ambulatory Visit | Attending: Radiation Oncology | Admitting: Radiation Oncology

## 2012-05-03 ENCOUNTER — Ambulatory Visit
Admission: RE | Admit: 2012-05-03 | Discharge: 2012-05-03 | Disposition: A | Discharge status: Home / Self Care | Payer: Medicare Other | Source: Ambulatory Visit | Attending: Radiation Oncology | Admitting: Radiation Oncology

## 2012-05-04 ENCOUNTER — Ambulatory Visit
Admission: RE | Admit: 2012-05-04 | Discharge: 2012-05-04 | Disposition: A | Discharge status: Home / Self Care | Payer: Medicare Other | Source: Ambulatory Visit | Attending: Radiation Oncology | Admitting: Radiation Oncology

## 2012-05-07 ENCOUNTER — Ambulatory Visit: Payer: Medicare Other

## 2012-05-08 ENCOUNTER — Ambulatory Visit
Admission: RE | Admit: 2012-05-08 | Discharge: 2012-05-08 | Disposition: A | Discharge status: Home / Self Care | Payer: Medicare Other | Source: Ambulatory Visit | Attending: Radiation Oncology | Admitting: Radiation Oncology

## 2012-05-08 ENCOUNTER — Encounter: Payer: Self-pay | Admitting: Radiation Oncology

## 2012-05-08 VITALS — BP 93/71 | HR 77 | Resp 16 | Wt 116.5 lb

## 2012-05-08 DIAGNOSIS — C3490 Malignant neoplasm of unspecified part of unspecified bronchus or lung: Secondary | ICD-10-CM

## 2012-05-08 NOTE — Progress Notes (Signed)
[facility]    Radiation Oncology [address] [place]     Maryln Gottron, M.D. [place], [place] [address]               Billie Lade, M.D., Ph.D. Phone: [phone]      Molli Hazard A. Kathrynn Running, M.D. Fax: [phone]      Radene Gunning, M.D., Ph.D.         Lurline Hare, M.D.         Grayland Jack, M.D Weekly Treatment Management Note  Name: Benjamin Moss     MRN: [id number]        CSN: [id number] Date: 05/08/2012      DOB: 1945/11/29  CC: Julieanne Manson, MD         [place]    Status: Outpatient  Diagnosis: The encounter diagnosis was Lung cancer.  Current Dose: 59.4 Gy  Current Fraction: 33  Planned Dose: 63 Gy  Narrative: Benjamin Moss was seen today for weekly treatment management. The chart was checked and MVCT  were reviewed. He continues to tolerate the treatments well at this time.  He denies any significant swallowing difficulties or pain with swallowing.  His breathing is stable.  Review of patient's allergies indicates no known allergies.  Current Outpatient Prescriptions  Medication Sig Dispense Refill  . amLODipine (NORVASC) 10 MG tablet Take 10 mg by mouth daily.      . clopidogrel (PLAVIX) 75 MG tablet Take 75 mg by mouth daily.      . hydrochlorothiazide (HYDRODIURIL) 25 MG tablet Take 25 mg by mouth daily.      Marland Kitchen HYDROcodone-acetaminophen (NORCO) 10-325 MG per tablet Take 1 tablet by mouth every 6 (six) hours as needed.  40 tablet  0  . lisinopril (PRINIVIL,ZESTRIL) 10 MG tablet Take 10 mg by mouth daily.      . potassium chloride SA (K-DUR,KLOR-CON) 20 MEQ tablet Take 1 tablet (20 mEq total) by mouth daily.  7 tablet  0   Labs:  Lab Results  Component Value Date   WBC 3.1* 04/30/2012   HGB 11.4* 04/30/2012   HCT 33.3* 04/30/2012   MCV 93.3 04/30/2012   PLT 167 04/30/2012   Lab Results  Component Value Date   CREATININE 1.2 04/30/2012   BUN 21.9 04/30/2012   NA 139 04/30/2012   K 4.0 04/30/2012   CL 104 04/30/2012   CO2 24 04/30/2012    Lab Results  Component Value Date   ALT 10 04/30/2012   AST 15 04/30/2012   PHOS 3.8 02/10/2010   BILITOT 0.30 04/30/2012    Physical Examination:  weight is 116 lb 8 oz (52.844 kg). His blood pressure is 93/71 and his pulse is 77. His respiration is 16 and oxygen saturation is 94%.    Wt Readings from Last 3 Encounters:  05/08/12 116 lb 8 oz (52.844 kg)  05/01/12 120 lb 1.6 oz (54.477 kg)  04/30/12 115 lb 9.6 oz (52.436 kg)     Lungs - Normal respiratory effort, chest expands symmetrically. Lungs are clear to auscultation, no crackles or wheezes.  Heart has regular rhythm and rate  Abdomen is soft and non tender with normal bowel sounds  Assessment:  Patient tolerating treatments well  Plan: Continue treatment per original radiation prescription for 2 additional treatments.

## 2012-05-08 NOTE — Progress Notes (Signed)
Patient presents to the clinic today unaccompanied for PUT with Dr. Roselind Messier. Patient is alert and oriented to person, place, and time. No distress noted. Patient being pushed in wheelchair due to generalized weakness. Pleasant affect noted. Patient denies pain at this time. Patient denies painful or difficulty swallowing. Patient reports a dry cough. Patient reports shortness of breath with exertion. Patient reports a great appetite. Patient denies nausea, vomiting, headache or dizziness. Reported all findings to Dr. Roselind Messier.

## 2012-05-09 ENCOUNTER — Ambulatory Visit
Admission: RE | Admit: 2012-05-09 | Discharge: 2012-05-09 | Disposition: A | Discharge status: Home / Self Care | Payer: Medicare Other | Source: Ambulatory Visit | Attending: Radiation Oncology | Admitting: Radiation Oncology

## 2012-05-10 ENCOUNTER — Ambulatory Visit
Admission: RE | Admit: 2012-05-10 | Discharge: 2012-05-10 | Disposition: A | Discharge status: Home / Self Care | Payer: Medicare Other | Source: Ambulatory Visit | Attending: Radiation Oncology | Admitting: Radiation Oncology

## 2012-05-10 ENCOUNTER — Encounter: Payer: Self-pay | Admitting: Radiation Oncology

## 2012-05-10 VITALS — BP 99/75 | HR 71 | Temp 97.7°F | Wt 118.6 lb

## 2012-05-10 DIAGNOSIS — C3490 Malignant neoplasm of unspecified part of unspecified bronchus or lung: Secondary | ICD-10-CM

## 2012-05-10 NOTE — Progress Notes (Signed)
Benjamin Moss completes today.  He received 35 fractions to his Right Lung.  he denies any SOB, or pain, but notes "some indigestion" as he points to his esophagus.  He reports that he is sleeping more since radiation started.  He lives alone, but states he dos not have any difficulty managing his ADL's.   Given a 1 month FU appt. For 06/07/12.

## 2012-05-10 NOTE — Progress Notes (Signed)
   Department of Radiation Oncology  Phone:  [phone] Fax:        [phone]   Special treatment procedure note  On 02/07/2012 the patient began his radiation therapy directed at the chest region. In addition the patient began radiosensitizing chemotherapy.  Given the increased potential for toxicities as well as the necessity for close monitoring of the patient and blood work, this constitutes a special treatment procedure.  -----------------------------------  Billie Lade, PhD, MD

## 2012-05-10 NOTE — Progress Notes (Signed)
  Radiation Oncology         [phone] ________________________________  Name: Benjamin Moss MRN: [id number]  Date: 05/10/2012  DOB: January 14, 1946  End of Treatment Note  Diagnosis:   Stage IIIA non-small cell lung cancer  Indication for treatment:  Definitive treatment along with radiosensitizing chemotherapy       Radiation treatment dates:   02/07/2012 through 05/10/2012  Site/dose:   Right central chest hilar, mediastinal region, 63 gray in 35 fractions  Beams/energy:   Helical intensity modulated radiation therapy using 6 megavoltage photons.   Narrative: The patient tolerated radiation treatment relatively well.   He did have some fatigue but minimal swallowing difficulties or pain with swallowing.   patient's treatment was extended over a longer period of time in light of the patient's difficulties with transportation.  Plan: The patient has completed radiation treatment. The patient will return to radiation oncology clinic for routine followup in one month. I advised them to call or return sooner if they have any questions or concerns related to their recovery or treatment.  -----------------------------------  Billie Lade, PhD, MD

## 2012-05-16 ENCOUNTER — Other Ambulatory Visit: Payer: Self-pay | Admitting: Physician Assistant

## 2012-06-01 ENCOUNTER — Ambulatory Visit (HOSPITAL_COMMUNITY)
Admission: RE | Admit: 2012-06-01 | Discharge: 2012-06-01 | Disposition: A | Discharge status: Home / Self Care | Payer: Medicare Other | Source: Ambulatory Visit | Attending: Internal Medicine | Admitting: Internal Medicine

## 2012-06-01 ENCOUNTER — Telehealth: Payer: Self-pay | Admitting: *Deleted

## 2012-06-01 ENCOUNTER — Other Ambulatory Visit (HOSPITAL_BASED_OUTPATIENT_CLINIC_OR_DEPARTMENT_OTHER): Payer: Medicare Other

## 2012-06-01 ENCOUNTER — Encounter (HOSPITAL_COMMUNITY): Payer: Self-pay

## 2012-06-01 DIAGNOSIS — C3490 Malignant neoplasm of unspecified part of unspecified bronchus or lung: Secondary | ICD-10-CM | POA: Insufficient documentation

## 2012-06-01 DIAGNOSIS — Z9221 Personal history of antineoplastic chemotherapy: Secondary | ICD-10-CM | POA: Insufficient documentation

## 2012-06-01 DIAGNOSIS — C3430 Malignant neoplasm of lower lobe, unspecified bronchus or lung: Secondary | ICD-10-CM

## 2012-06-01 DIAGNOSIS — Z09 Encounter for follow-up examination after completed treatment for conditions other than malignant neoplasm: Secondary | ICD-10-CM | POA: Insufficient documentation

## 2012-06-01 DIAGNOSIS — Z923 Personal history of irradiation: Secondary | ICD-10-CM | POA: Insufficient documentation

## 2012-06-01 DIAGNOSIS — R0602 Shortness of breath: Secondary | ICD-10-CM | POA: Insufficient documentation

## 2012-06-01 LAB — CBC WITH DIFFERENTIAL/PLATELET
Eosinophils Absolute: 0.2 10*3/uL (ref 0.0–0.5)
MONO#: 0.3 10*3/uL (ref 0.1–0.9)
NEUT#: 1.2 10*3/uL — ABNORMAL LOW (ref 1.5–6.5)
Platelets: 212 10*3/uL (ref 140–400)
RBC: 3.78 10*6/uL — ABNORMAL LOW (ref 4.20–5.82)
RDW: 16.3 % — ABNORMAL HIGH (ref 11.0–14.6)
WBC: 2.3 10*3/uL — ABNORMAL LOW (ref 4.0–10.3)
lymph#: 0.6 10*3/uL — ABNORMAL LOW (ref 0.9–3.3)

## 2012-06-01 LAB — COMPREHENSIVE METABOLIC PANEL (CC13)
ALT: 6 U/L (ref 0–55)
Albumin: 4 g/dL (ref 3.5–5.0)
CO2: 26 mEq/L (ref 22–29)
Chloride: 102 mEq/L (ref 98–107)
Glucose: 89 mg/dl (ref 70–99)
Potassium: 3 mEq/L — CL (ref 3.5–5.1)
Sodium: 141 mEq/L (ref 136–145)
Total Protein: 7.9 g/dL (ref 6.4–8.3)

## 2012-06-01 MED ORDER — POTASSIUM CHLORIDE CRYS ER 20 MEQ PO TBCR: 40.0000 meq | EXTENDED_RELEASE_TABLET | Freq: Every day | ORAL | Status: DC

## 2012-06-01 MED ORDER — IOHEXOL 300 MG/ML  SOLN
80.0000 mL | Freq: Once | INTRAMUSCULAR | Status: AC | PRN
  Administered 2012-06-01: 80 mL via INTRAVENOUS

## 2012-06-01 MED ORDER — POTASSIUM CHLORIDE CRYS ER 20 MEQ PO TBCR: 20.0000 meq | EXTENDED_RELEASE_TABLET | Freq: Every day | ORAL | Status: DC

## 2012-06-01 MED ORDER — IOHEXOL 300 MG/ML  SOLN: 80.0000 mL | Freq: Once | INTRAMUSCULAR | Status: AC | PRN

## 2012-06-01 NOTE — Addendum Note (Signed)
Addended by: Caren Griffins on: 06/01/2012 01:42 PM   Modules accepted: Orders, Medications

## 2012-06-01 NOTE — Telephone Encounter (Signed)
Called and left msg with instructions regarding low K level and to take K x 7 days and to call back with any questions.  SLJ

## 2012-06-01 NOTE — Telephone Encounter (Signed)
CORRECTION---PER AJ, pt is to have DAILY X 7 days.  Correct dose called into pharmacy.  Left pt another voicemail with instructions.  SLJ

## 2012-06-02 NOTE — Progress Notes (Signed)
Quick Note:  Call patient with the result and start K DUR 20 meq po qd X 7 days. ______

## 2012-06-04 ENCOUNTER — Other Ambulatory Visit: Payer: Medicare Other

## 2012-06-04 ENCOUNTER — Other Ambulatory Visit: Payer: Self-pay | Admitting: Physician Assistant

## 2012-06-06 ENCOUNTER — Ambulatory Visit: Payer: Medicare Other | Admitting: Internal Medicine

## 2012-06-07 ENCOUNTER — Ambulatory Visit
Admission: RE | Admit: 2012-06-07 | Discharge: 2012-06-07 | Disposition: A | Discharge status: Home / Self Care | Payer: Medicare Other | Source: Ambulatory Visit | Attending: Radiation Oncology | Admitting: Radiation Oncology

## 2012-06-07 ENCOUNTER — Encounter: Payer: Self-pay | Admitting: Radiation Oncology

## 2012-06-07 MED ORDER — ALBUTEROL SULFATE HFA 108 (90 BASE) MCG/ACT IN AERS: 2.0000 | INHALATION_SPRAY | Freq: Four times a day (QID) | RESPIRATORY_TRACT | Status: AC | PRN

## 2012-06-07 NOTE — Progress Notes (Signed)
Patient presents to the clinic today unaccompanied for follow up with Dr. Roselind Messier. Patient alert and oriented to person, place, and time. No distress noted. Slow steady gait noted with assistance of cane. Pleasant affect noted. Patient reports constant headache since stroke and continues center of center pain that does not radiate. Patient unable to score pain level. Patient reports a dry cough. Patient reports shortness of breath continues but is better. Patient has lost two pounds since completing radiation treatment. Patient does not have any upcoming appointments. Patient missed appointment with [facility] yesterday. Patient reports they picked up his home oxygen yesterday. Patient requesting refill on proair. Patient reports occasional discomfort when he swallows. Patient reports that he has been vomiting all week off and on. Patient denies diarrhea. Patient denies diplopia, dizziness or ringing in the ear. Patient denies night sweats. Reported all findings to Dr. Roselind Messier.

## 2012-06-07 NOTE — Progress Notes (Signed)
Radiation Oncology         [phone] ________________________________  Name: Benjamin Moss MRN: [id number]  Date: 06/07/2012  DOB: Mar 27, 1946  Follow-Up Visit Note  CC: Julieanne Manson, MD  Loreli Slot, *  Diagnosis:   Stage IIIa non-small cell lung cancer  Interval Since Last Radiation:  1  months  Narrative:  The patient returns today for routine follow-up.  He seems to be doing well at this time. He denies any significant cough or hemoptysis. He denies any pain in the chest area. He occasionally will have some mild swallowing discomfort but nothing significant. He denies any breathing problems. He has chronic headaches. He denies any new bony pain. This did undergo a repeat chest CT scan which showed a positive response to his therapy. This is documented below.    I did refill the patient's inhaler today.                          ALLERGIES:  has No Known Allergies.  Meds: Current Outpatient Prescriptions  Medication Sig Dispense Refill  . albuterol (PROVENTIL HFA;VENTOLIN HFA) 108 (90 BASE) MCG/ACT inhaler Inhale 2 puffs into the lungs every 6 (six) hours as needed for wheezing.      Marland Kitchen amLODipine (NORVASC) 10 MG tablet Take 10 mg by mouth daily.      . hydrochlorothiazide (HYDRODIURIL) 25 MG tablet Take 25 mg by mouth daily.      Marland Kitchen lisinopril (PRINIVIL,ZESTRIL) 10 MG tablet Take 10 mg by mouth daily.      . clopidogrel (PLAVIX) 75 MG tablet Take 75 mg by mouth daily.      Marland Kitchen HYDROcodone-acetaminophen (NORCO) 10-325 MG per tablet Take 1 tablet by mouth every 6 (six) hours as needed.  40 tablet  0  . potassium chloride SA (K-DUR,KLOR-CON) 20 MEQ tablet Take 2 tablets (40 mEq total) by mouth daily. daily x 7 days  14 tablet  0   No current facility-administered medications for this encounter.    Physical Findings: The patient is in no acute distress. Patient is alert and oriented.  weight is 116 lb 1.6 oz (52.663 kg). His oral temperature is 98.5 F (36.9 C). His  blood pressure is 110/76 and his pulse is 94. His respiration is 18 and oxygen saturation is 93%. .  No palpable cervical supraclavicular or axillary adenopathy. The lungs are clear to auscultation. The heart has a regular rhythm and rate.  Lab Findings: Lab Results  Component Value Date   WBC 2.3* 06/01/2012   HGB 12.8* 06/01/2012   HCT 37.1* 06/01/2012   MCV 97.9 06/01/2012   PLT 212 06/01/2012     Radiographic Findings: Ct Chest W Contrast  06/01/2012  *RADIOLOGY REPORT*  Clinical Data: Follow-up lung cancer.  Chemotherapy and radiation therapy completed 1 month ago.  Shortness of breath.  CT CHEST WITH CONTRAST  Technique:  Multidetector CT imaging of the chest was performed following the standard protocol during bolus administration of intravenous contrast.  Contrast: 80mL OMNIPAQUE IOHEXOL 300 MG/ML  SOLN  Comparison: PET CT 01/09/2012.  Chest CT 01/06/2012.  Findings: The dominant spiculated hypermetabolic mass in the superior segment of the right lower lobe has decreased in size, now measuring 2.6 x 2.6 cm (previously 3.3 x 3.5 cm).  This abuts the superior aspect of the major fissure.  The small hypermetabolic nodule in the superior segment of the left lower lobe demonstrated on PET CT appears slightly smaller, measuring  6 x 3 mm on image 24.  There is some tenting of the major fissure.  No new or enlarging pulmonary nodules are identified. There are diffuse emphysematous changes throughout both lungs.  Port-A-Cath tip is at the SVC right atrial junction.  There are no enlarged mediastinal or hilar lymph nodes. There is an 8 mm precarinal node on image 26 which was hypermetabolic on PET CT and appears stable to slightly decreased in size.  Prominent asymmetric nodularity within the left axilla is unchanged and appears tubular on the reformatted images, favored to represent venous structures. Coronary artery calcifications are noted.  There is no pleural or pericardial effusion.  The visualized  upper abdomen appears unchanged without suspicious findings.  There are no worrisome osseous findings.  IMPRESSION:  1.  Interval decrease size of right lower lobe mass consistent with positive response to therapy. 2.  The small hypermetabolic nodule in the superior segment of the left lower lobe has also slightly decreased in size. 3.  Stable to improved small mediastinal lymph nodes.  No progressive disease identified.   Original Report Authenticated By: Carey Bullocks, M.D.     Impression:  The patient is recovering from the effects of radiation.  As above positive response to his therapy based on imaging.  Plan:  Routine followup in 6 months.  If his disease in the right chest goes into remission we may need to consider SBRT for his left lung lesion.  _____________________________________  -----------------------------------  Billie Lade, PhD, MD

## 2012-07-05 ENCOUNTER — Other Ambulatory Visit: Payer: Self-pay | Admitting: Internal Medicine

## 2012-07-05 ENCOUNTER — Telehealth: Payer: Self-pay | Admitting: Internal Medicine

## 2012-07-05 ENCOUNTER — Other Ambulatory Visit: Payer: Self-pay | Admitting: *Deleted

## 2012-07-05 NOTE — Telephone Encounter (Signed)
Patient needs to r/s appointment

## 2012-07-19 ENCOUNTER — Other Ambulatory Visit (HOSPITAL_BASED_OUTPATIENT_CLINIC_OR_DEPARTMENT_OTHER): Payer: Medicare Other | Admitting: Lab

## 2012-07-19 ENCOUNTER — Ambulatory Visit (HOSPITAL_BASED_OUTPATIENT_CLINIC_OR_DEPARTMENT_OTHER): Payer: Medicare Other | Admitting: Internal Medicine

## 2012-07-19 ENCOUNTER — Encounter: Payer: Self-pay | Admitting: Internal Medicine

## 2012-07-19 ENCOUNTER — Telehealth: Payer: Self-pay | Admitting: Internal Medicine

## 2012-07-19 DIAGNOSIS — C3490 Malignant neoplasm of unspecified part of unspecified bronchus or lung: Secondary | ICD-10-CM

## 2012-07-19 LAB — CBC WITH DIFFERENTIAL/PLATELET
BASO%: 2.6 % — ABNORMAL HIGH (ref 0.0–2.0)
Eosinophils Absolute: 0.3 10*3/uL (ref 0.0–0.5)
MCHC: 34.2 g/dL (ref 32.0–36.0)
MONO#: 0.4 10*3/uL (ref 0.1–0.9)
MONO%: 10.3 % (ref 0.0–14.0)
NEUT#: 2.2 10*3/uL (ref 1.5–6.5)
RBC: 4.21 10*6/uL (ref 4.20–5.82)
RDW: 13.9 % (ref 11.0–14.6)
WBC: 3.9 10*3/uL — ABNORMAL LOW (ref 4.0–10.3)

## 2012-07-19 LAB — COMPREHENSIVE METABOLIC PANEL (CC13)
Albumin: 3.8 g/dL (ref 3.5–5.0)
Alkaline Phosphatase: 100 U/L (ref 40–150)
BUN: 22.7 mg/dL (ref 7.0–26.0)
Creatinine: 1.3 mg/dL (ref 0.7–1.3)
Glucose: 88 mg/dl (ref 70–99)
Total Bilirubin: 0.38 mg/dL (ref 0.20–1.20)

## 2012-07-19 NOTE — Patient Instructions (Signed)
You have partial response on the recent scan. Continue on observation with repeat CT scan of the chest in 3 months

## 2012-07-19 NOTE — Telephone Encounter (Signed)
gv and printed appt sched and avs for pt  °

## 2012-07-19 NOTE — Progress Notes (Signed)
[facility] [phone]   [phone]  OFFICE PROGRESS NOTE  Julieanne Manson, MD [address] [place] [address]  DIAGNOSIS: non-small cell lung cancer, squamous cell carcinoma presenting with synchronous tumor as stage IIIA (T2a., N2, M0) in the right lung and questionably stage IA (T1a., N0, M0) in the left lung based on the imaging studies also the final pathology did not show metastatic squamous cell carcinoma in the mediastinal lymph nodes or the left lower lobe mass   PRIOR THERAPY: Concurrent chemoradiation with weekly carboplatin for AUC of 2 and paclitaxel 45 mg/M2, last dose was given 04/30/2012.    CURRENT THERAPY: Observation.  INTERVAL HISTORY: Benjamin Moss 67 y.o. male returns to the clinic today for followup visit. The patient is feeling fine today with no specific complaints except for mild fatigue and shortness breath with exertion. He tolerated the previous course of concurrent chemoradiation fairly well and this was completed on 04/30/2012. He had repeat CT scan of the chest performed the end of February 2014 and he is here today for evaluation and discussion of his scan results.  MEDICAL HISTORY: Past Medical History  Diagnosis Date  . Hyperlipidemia   . Hypertension   . COPD (chronic obstructive pulmonary disease)   . Stroke     left side weakness, ambulates with cane/ walker  . Right lower lobe lung mass   . Hypoxia   . Nodule of left lung   . Lung cancer 01/19/2012    ALLERGIES:  has No Known Allergies.  MEDICATIONS:  Current Outpatient Prescriptions  Medication Sig Dispense Refill  . albuterol (PROVENTIL HFA;VENTOLIN HFA) 108 (90 BASE) MCG/ACT inhaler Inhale 2 puffs into the lungs every 6 (six) hours as needed for wheezing.  3.7 Inhaler  2  . amLODipine (NORVASC) 10 MG tablet Take 10 mg by mouth daily.      . clopidogrel (PLAVIX) 75 MG tablet Take 75 mg by mouth daily.      .  hydrochlorothiazide (HYDRODIURIL) 25 MG tablet Take 25 mg by mouth daily.      Marland Kitchen HYDROcodone-acetaminophen (NORCO) 10-325 MG per tablet Take 1 tablet by mouth every 6 (six) hours as needed.  40 tablet  0  . lisinopril (PRINIVIL,ZESTRIL) 10 MG tablet Take 10 mg by mouth daily.       No current facility-administered medications for this visit.    SURGICAL HISTORY:  Past Surgical History  Procedure Laterality Date  . Tonsillectomy      REVIEW OF SYSTEMS:  A comprehensive review of systems was negative except for: Constitutional: positive for fatigue Respiratory: positive for dyspnea on exertion   PHYSICAL EXAMINATION: General appearance: alert, cooperative, fatigued and no distress Head: Normocephalic, without obvious abnormality, atraumatic Neck: no adenopathy Resp: clear to auscultation bilaterally Cardio: regular rate and rhythm, S1, S2 normal, no murmur, click, rub or gallop GI: soft, non-tender; bowel sounds normal; no masses,  no organomegaly Extremities: extremities normal, atraumatic, no cyanosis or edema Neurologic: Alert and oriented X 3, normal strength and tone. Normal symmetric reflexes. Normal coordination and gait  ECOG PERFORMANCE STATUS: 1 - Symptomatic but completely ambulatory  Blood pressure 112/73, pulse 89, temperature 97.5 F (36.4 C), temperature source Oral, resp. rate 20, height 5\' 6"  (1.676 m), weight 116 lb 4.8 oz (52.753 kg).  LABORATORY DATA: Lab Results  Component Value Date   WBC 3.9* 07/19/2012   HGB 13.9 07/19/2012   HCT 40.6 07/19/2012   MCV 96.4  07/19/2012   PLT 174 07/19/2012      Chemistry      Component Value Date/Time   NA 141 06/01/2012 1053   NA 138 02/06/2012 0750   K 3.0 Repeated and Verified* 06/01/2012 1053   K 3.2* 02/06/2012 0750   CL 102 06/01/2012 1053   CL 95* 02/06/2012 0750   CO2 26 06/01/2012 1053   CO2 30 02/06/2012 0750   BUN 23.1 06/01/2012 1053   BUN 30* 02/06/2012 0750   CREATININE 1.3 06/01/2012 1053   CREATININE 1.19  02/06/2012 0750      Component Value Date/Time   CALCIUM 9.9 06/01/2012 1053   CALCIUM 10.5 02/06/2012 0750   ALKPHOS 95 06/01/2012 1053   ALKPHOS 99 02/06/2012 0750   AST 13 06/01/2012 1053   AST 26 02/06/2012 0750   ALT 6 06/01/2012 1053   ALT 19 02/06/2012 0750   BILITOT 0.33 06/01/2012 1053   BILITOT 0.8 02/06/2012 0750       RADIOGRAPHIC STUDIES: CT CHEST WITH CONTRAST  Technique: Multidetector CT imaging of the chest was performed  following the standard protocol during bolus administration of  intravenous contrast.  Contrast: 80mL OMNIPAQUE IOHEXOL 300 MG/ML SOLN  Comparison: PET CT 01/09/2012. Chest CT 01/06/2012.  Findings: The dominant spiculated hypermetabolic mass in the  superior segment of the right lower lobe has decreased in size, now  measuring 2.6 x 2.6 cm (previously 3.3 x 3.5 cm). This abuts the  superior aspect of the major fissure.  The small hypermetabolic nodule in the superior segment of the left  lower lobe demonstrated on PET CT appears slightly smaller,  measuring 6 x 3 mm on image 24. There is some tenting of the major  fissure. No new or enlarging pulmonary nodules are identified.  There are diffuse emphysematous changes throughout both lungs.  Port-A-Cath tip is at the SVC right atrial junction. There are no  enlarged mediastinal or hilar lymph nodes. There is an 8 mm  precarinal node on image 26 which was hypermetabolic on PET CT and  appears stable to slightly decreased in size. Prominent asymmetric  nodularity within the left axilla is unchanged and appears tubular  on the reformatted images, favored to represent venous structures.  Coronary artery calcifications are noted. There is no pleural or  pericardial effusion.  The visualized upper abdomen appears unchanged without suspicious  findings.  There are no worrisome osseous findings.  IMPRESSION:  1. Interval decrease size of right lower lobe mass consistent with  positive response to therapy.    2. The small hypermetabolic nodule in the superior segment of the  left lower lobe has also slightly decreased in size.  3. Stable to improved small mediastinal lymph nodes. No  progressive disease identified.  Original Report Authenticated By: Carey Bullocks, M.D.   ASSESSMENT: this is a very pleasant 67 years old African American male with synchronous non-small cell lung cancer presenting as a stage IIIa in the right lung as well as a stage IA in the left lung base. He is status post concurrent chemoradiation and tolerated it fairly well with partial response on the recent scan.  PLAN: I discussed the scan results with the patient today. I recommended for him to continue on observation with repeat CT scan of the chest in 3 months. He was advised to call me immediately if he has any concerning symptoms in the interval.  All questions were answered. The patient knows to call the clinic with any problems, questions or  concerns. We can certainly see the patient much sooner if necessary.  I spent 15 minutes counseling the patient face to face. The total time spent in the appointment was 25 minutes.

## 2012-09-18 ENCOUNTER — Other Ambulatory Visit: Payer: Medicare Other | Admitting: Lab

## 2012-09-18 ENCOUNTER — Other Ambulatory Visit (HOSPITAL_BASED_OUTPATIENT_CLINIC_OR_DEPARTMENT_OTHER): Payer: Medicare HMO | Admitting: Lab

## 2012-09-18 ENCOUNTER — Ambulatory Visit (HOSPITAL_COMMUNITY)
Admission: RE | Admit: 2012-09-18 | Discharge: 2012-09-18 | Disposition: A | Discharge status: Home / Self Care | Payer: Medicare HMO | Source: Ambulatory Visit | Attending: Internal Medicine | Admitting: Internal Medicine

## 2012-09-18 ENCOUNTER — Encounter (HOSPITAL_COMMUNITY): Payer: Self-pay

## 2012-09-18 DIAGNOSIS — C3490 Malignant neoplasm of unspecified part of unspecified bronchus or lung: Secondary | ICD-10-CM

## 2012-09-18 LAB — CBC WITH DIFFERENTIAL/PLATELET
Basophils Absolute: 0.1 10*3/uL (ref 0.0–0.1)
Eosinophils Absolute: 0.2 10*3/uL (ref 0.0–0.5)
HCT: 43.7 % (ref 38.4–49.9)
HGB: 15.1 g/dL (ref 13.0–17.1)
MCV: 95 fL (ref 79.3–98.0)
MONO%: 9.9 % (ref 0.0–14.0)
NEUT#: 2.6 10*3/uL (ref 1.5–6.5)
NEUT%: 55.7 % (ref 39.0–75.0)
Platelets: 196 10*3/uL (ref 140–400)
RDW: 14 % (ref 11.0–14.6)

## 2012-09-18 LAB — COMPREHENSIVE METABOLIC PANEL (CC13)
Albumin: 4 g/dL (ref 3.5–5.0)
Alkaline Phosphatase: 101 U/L (ref 40–150)
BUN: 16.9 mg/dL (ref 7.0–26.0)
Calcium: 10 mg/dL (ref 8.4–10.4)
Chloride: 101 mEq/L (ref 98–107)
Creatinine: 1.2 mg/dL (ref 0.7–1.3)
Glucose: 94 mg/dl (ref 70–99)
Potassium: 3 mEq/L — CL (ref 3.5–5.1)

## 2012-09-18 MED ORDER — IOHEXOL 300 MG/ML  SOLN
80.0000 mL | Freq: Once | INTRAMUSCULAR | Status: AC | PRN
  Administered 2012-09-18: 80 mL via INTRAVENOUS

## 2012-09-18 NOTE — Progress Notes (Signed)
Quick Note:  Call patient with the result and order KDur 20 meq po qd x 10 days. ______ 

## 2012-09-19 ENCOUNTER — Telehealth: Payer: Self-pay | Admitting: *Deleted

## 2012-09-19 DIAGNOSIS — E876 Hypokalemia: Secondary | ICD-10-CM

## 2012-09-19 MED ORDER — POTASSIUM CHLORIDE CRYS ER 20 MEQ PO TBCR: 20.0000 meq | EXTENDED_RELEASE_TABLET | ORAL | Status: DC

## 2012-09-19 NOTE — Telephone Encounter (Signed)
Message copied by Caren Griffins on Wed Sep 19, 2012  3:57 PM ------      Message from: Si Gaul      Created: Tue Sep 18, 2012  6:43 PM       Call patient with the result and order K Dur 20 meq po qd x 10 days. ------

## 2012-09-19 NOTE — Telephone Encounter (Signed)
Attempted to call pt home and cell #, unable to leave msg at either #, called william pendegrass, left msg with instructions on cell phone vm.  SLJ

## 2012-10-04 ENCOUNTER — Other Ambulatory Visit: Payer: Self-pay | Admitting: Internal Medicine

## 2012-10-18 ENCOUNTER — Other Ambulatory Visit: Payer: Medicare Other | Admitting: Lab

## 2012-10-18 ENCOUNTER — Ambulatory Visit: Payer: Medicare Other | Admitting: Internal Medicine

## 2012-10-22 ENCOUNTER — Telehealth: Payer: Self-pay | Admitting: *Deleted

## 2012-10-22 NOTE — Telephone Encounter (Signed)
Received vm from friend of pt, [name] who stated pt is out of meds but doesn't know which ones. Spoke directly w/Mr Benjamin Moss who states he cannot read the labels but would spell his medications. Pt went to retrieve bottles and then stated the names were rubbed off. He states he got refills last week and was told he had no more refills. Pt states he has a PCP. Advised pt to call his PCP for refill on his medications. Pt verbalized understanding.

## 2012-11-30 ENCOUNTER — Encounter: Payer: Self-pay | Admitting: Radiation Oncology

## 2012-11-30 DIAGNOSIS — Z923 Personal history of irradiation: Secondary | ICD-10-CM | POA: Insufficient documentation

## 2012-12-06 ENCOUNTER — Ambulatory Visit: Payer: Medicare Other | Attending: Radiation Oncology | Admitting: Radiation Oncology

## 2013-01-01 ENCOUNTER — Encounter: Payer: Self-pay | Admitting: *Deleted

## 2013-01-18 IMAGING — CR DG CHEST 2V
2 series · 2 of 2 positions shown · non-contrast
Comparison: 11/10/2037

CLINICAL DATA: Preop for lung surgery and bronchoscopy.  Shortness
of breath.  Chest pain.  Headache.  Cough.

CHEST - 2 VIEW

[view not recorded (1 of 2)]
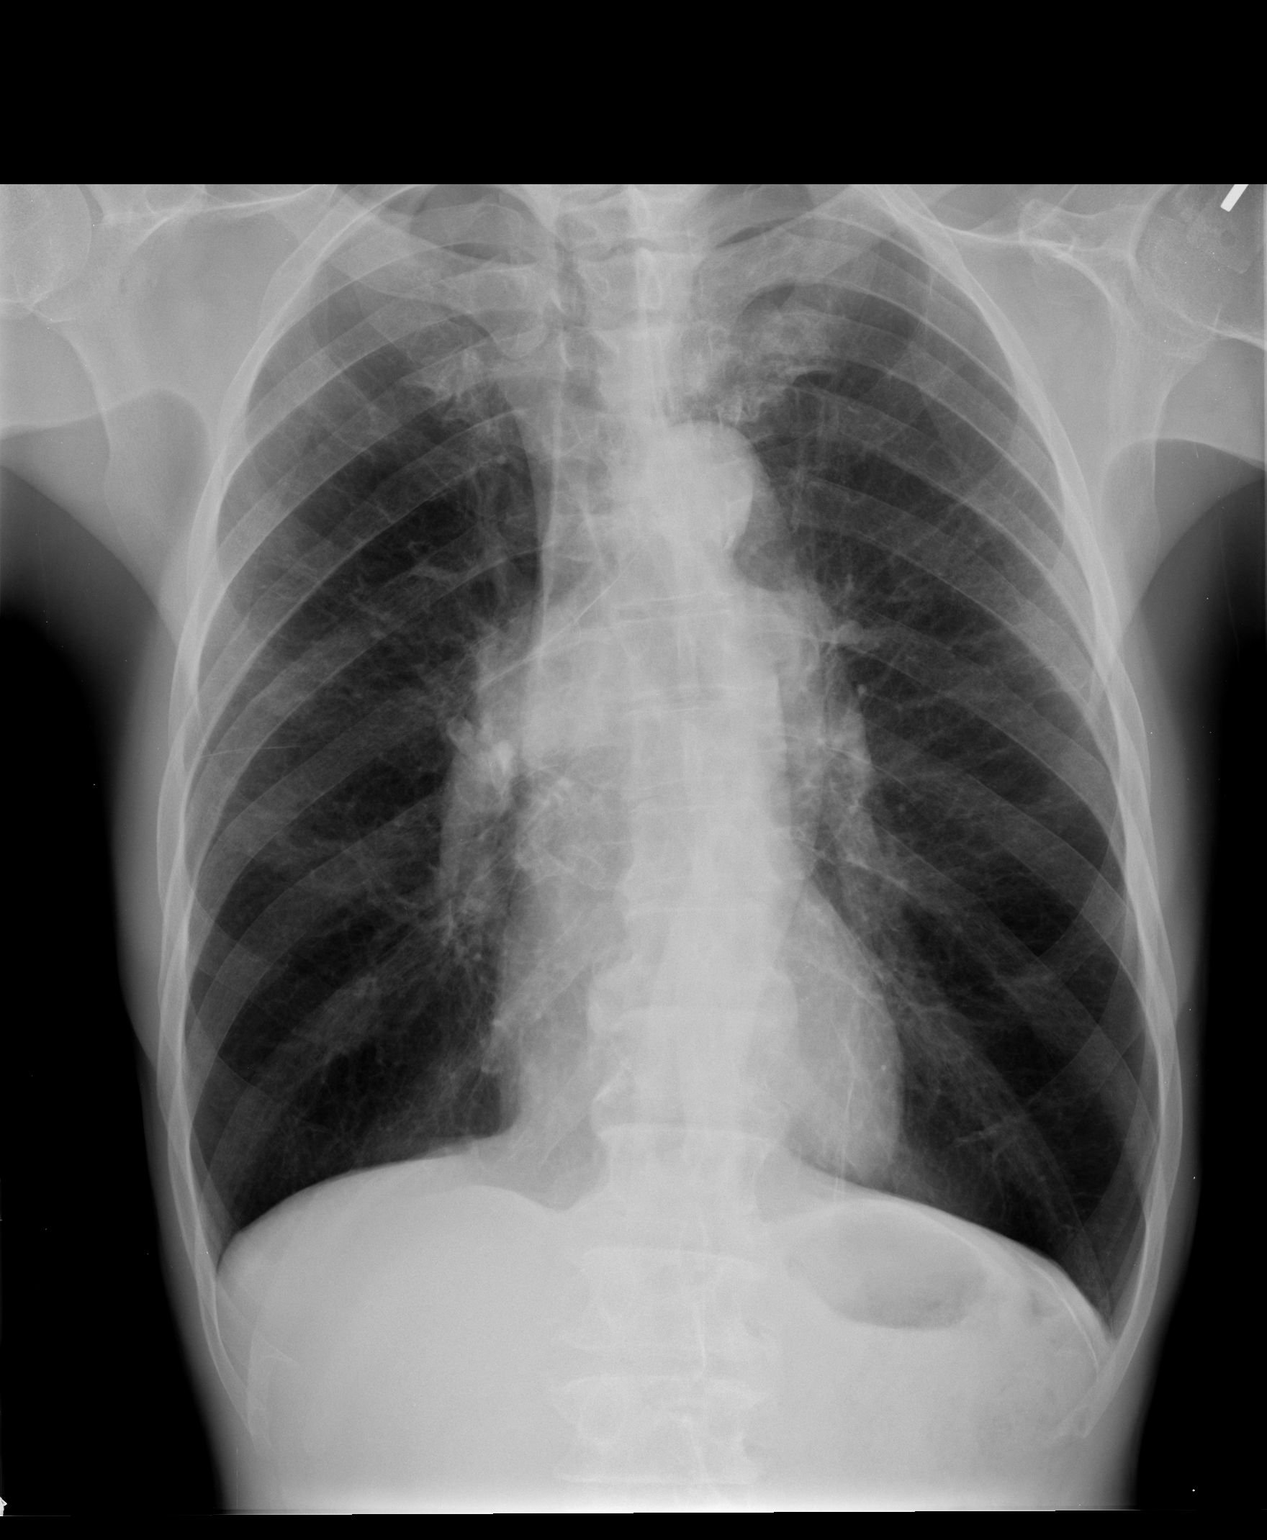

[view not recorded (2 of 2)]
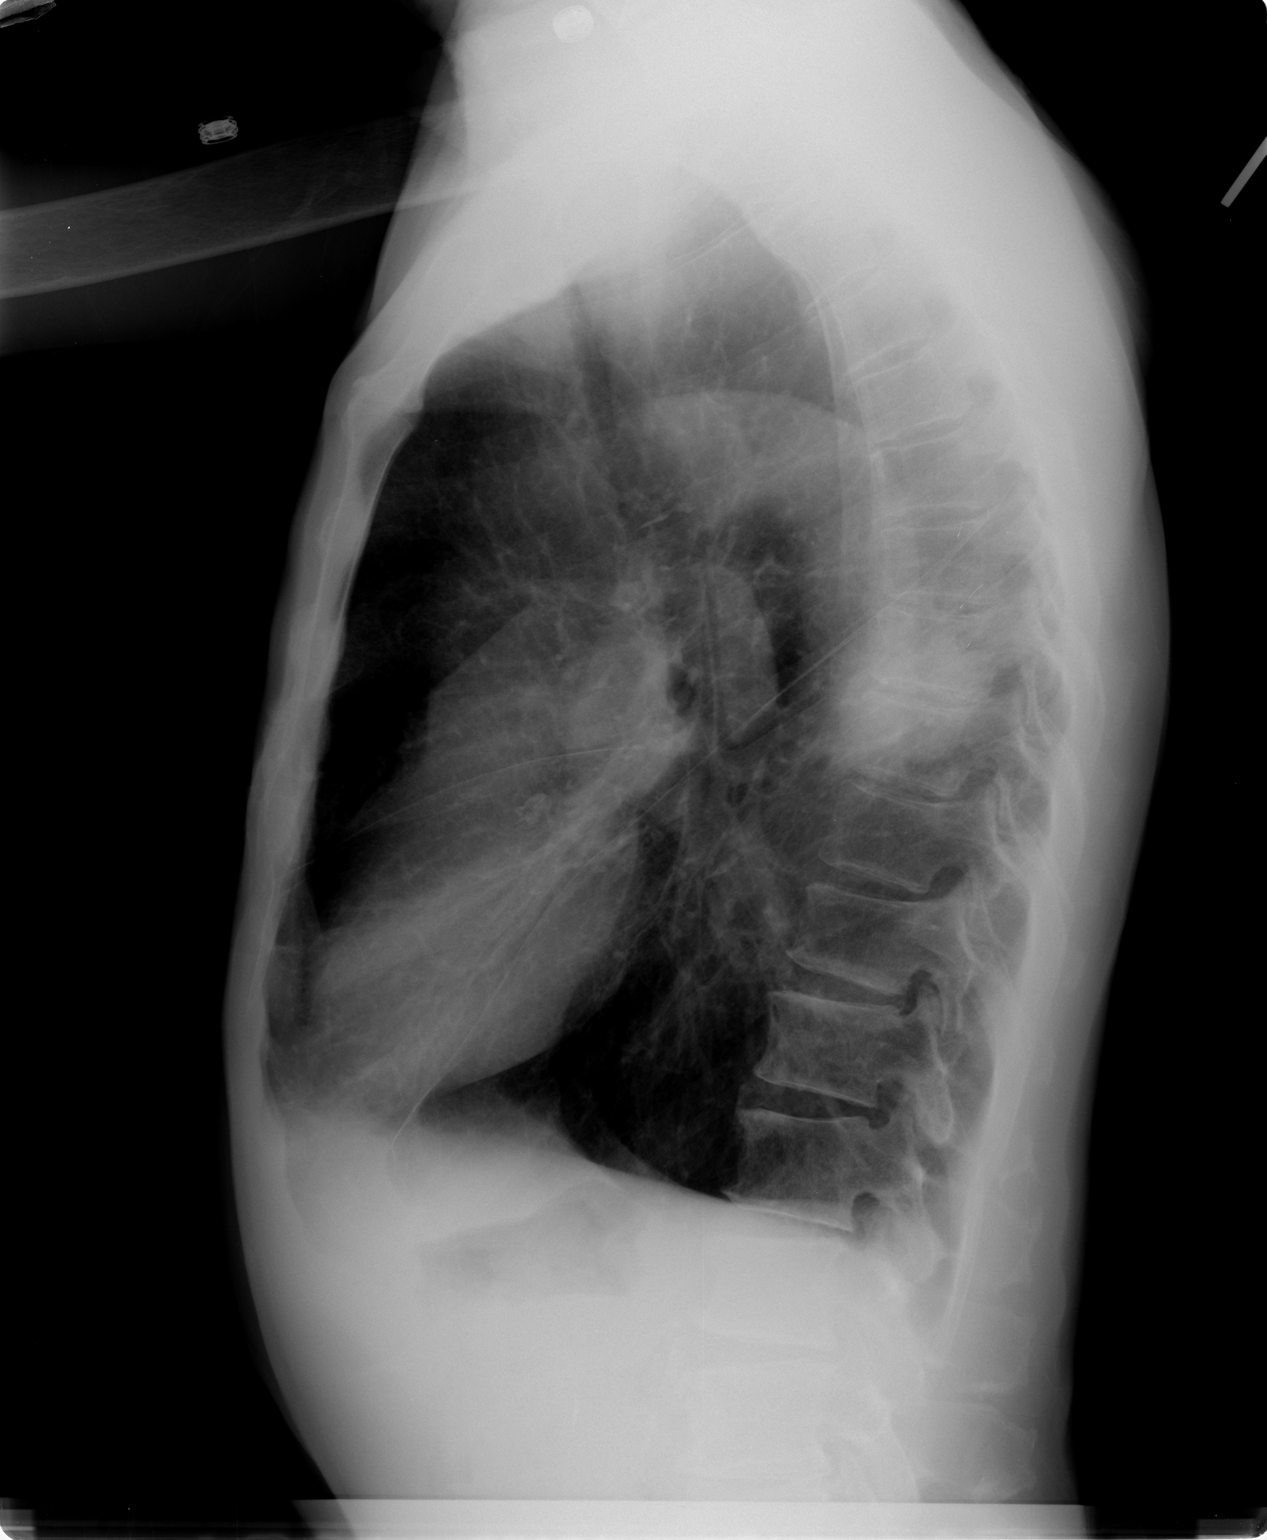

[2 of 2 positions shown; findings below may reference images not displayed]

FINDINGS: Mass in the superior segment right lower lung medially
measuring 3.8 cm diameter.  Stable appearance since previous study.
Diffuse emphysematous changes and scattered fibrosis in the lungs.
No blunting of costophrenic angles.  No pneumothorax.  Calcified
and tortuous aorta.  Normal heart size and pulmonary vascularity.
No pneumothorax.  Degenerative changes in the thoracic spine.
IMPRESSION: Stable appearance of mass in the medial superior segment right
lower lung.  Diffuse emphysematous changes.

## 2013-04-13 ENCOUNTER — Inpatient Hospital Stay (HOSPITAL_COMMUNITY)
Admission: EM | Admit: 2013-04-13 | Discharge: 2013-04-17 | DRG: 180 | Disposition: A | Discharge status: Skilled Nursing Facility | Payer: Medicare HMO | Attending: Internal Medicine | Admitting: Internal Medicine

## 2013-04-13 ENCOUNTER — Emergency Department (HOSPITAL_COMMUNITY): Payer: Medicare HMO

## 2013-04-13 ENCOUNTER — Encounter (HOSPITAL_COMMUNITY): Payer: Self-pay | Admitting: Emergency Medicine

## 2013-04-13 DIAGNOSIS — Z681 Body mass index (BMI) 19 or less, adult: Secondary | ICD-10-CM

## 2013-04-13 DIAGNOSIS — J9600 Acute respiratory failure, unspecified whether with hypoxia or hypercapnia: Secondary | ICD-10-CM

## 2013-04-13 DIAGNOSIS — J449 Chronic obstructive pulmonary disease, unspecified: Secondary | ICD-10-CM

## 2013-04-13 DIAGNOSIS — F17200 Nicotine dependence, unspecified, uncomplicated: Secondary | ICD-10-CM

## 2013-04-13 DIAGNOSIS — Z923 Personal history of irradiation: Secondary | ICD-10-CM

## 2013-04-13 DIAGNOSIS — T68XXXA Hypothermia, initial encounter: Secondary | ICD-10-CM

## 2013-04-13 DIAGNOSIS — I69959 Hemiplegia and hemiparesis following unspecified cerebrovascular disease affecting unspecified side: Secondary | ICD-10-CM

## 2013-04-13 DIAGNOSIS — D649 Anemia, unspecified: Secondary | ICD-10-CM

## 2013-04-13 DIAGNOSIS — J4489 Other specified chronic obstructive pulmonary disease: Secondary | ICD-10-CM

## 2013-04-13 DIAGNOSIS — X31XXXA Exposure to excessive natural cold, initial encounter: Secondary | ICD-10-CM | POA: Diagnosis present

## 2013-04-13 DIAGNOSIS — E860 Dehydration: Secondary | ICD-10-CM

## 2013-04-13 DIAGNOSIS — E43 Unspecified severe protein-calorie malnutrition: Secondary | ICD-10-CM

## 2013-04-13 DIAGNOSIS — E876 Hypokalemia: Secondary | ICD-10-CM

## 2013-04-13 DIAGNOSIS — J441 Chronic obstructive pulmonary disease with (acute) exacerbation: Secondary | ICD-10-CM | POA: Diagnosis present

## 2013-04-13 DIAGNOSIS — D638 Anemia in other chronic diseases classified elsewhere: Secondary | ICD-10-CM | POA: Diagnosis present

## 2013-04-13 DIAGNOSIS — C3490 Malignant neoplasm of unspecified part of unspecified bronchus or lung: Principal | ICD-10-CM

## 2013-04-13 DIAGNOSIS — R410 Disorientation, unspecified: Secondary | ICD-10-CM

## 2013-04-13 DIAGNOSIS — R1310 Dysphagia, unspecified: Secondary | ICD-10-CM | POA: Diagnosis present

## 2013-04-13 DIAGNOSIS — Z9221 Personal history of antineoplastic chemotherapy: Secondary | ICD-10-CM

## 2013-04-13 DIAGNOSIS — I69991 Dysphagia following unspecified cerebrovascular disease: Secondary | ICD-10-CM

## 2013-04-13 DIAGNOSIS — D72829 Elevated white blood cell count, unspecified: Secondary | ICD-10-CM | POA: Diagnosis present

## 2013-04-13 DIAGNOSIS — Z79899 Other long term (current) drug therapy: Secondary | ICD-10-CM

## 2013-04-13 DIAGNOSIS — R079 Chest pain, unspecified: Secondary | ICD-10-CM

## 2013-04-13 DIAGNOSIS — J9601 Acute respiratory failure with hypoxia: Secondary | ICD-10-CM | POA: Diagnosis present

## 2013-04-13 DIAGNOSIS — I10 Essential (primary) hypertension: Secondary | ICD-10-CM

## 2013-04-13 DIAGNOSIS — G9340 Encephalopathy, unspecified: Secondary | ICD-10-CM

## 2013-04-13 DIAGNOSIS — E785 Hyperlipidemia, unspecified: Secondary | ICD-10-CM | POA: Diagnosis present

## 2013-04-13 DIAGNOSIS — E119 Type 2 diabetes mellitus without complications: Secondary | ICD-10-CM | POA: Diagnosis present

## 2013-04-13 DIAGNOSIS — R0902 Hypoxemia: Secondary | ICD-10-CM

## 2013-04-13 LAB — BLOOD GAS, ARTERIAL
ACID-BASE EXCESS: 0.6 mmol/L (ref 0.0–2.0)
BICARBONATE: 25.6 meq/L — AB (ref 20.0–24.0)
Drawn by: 307971
FIO2: 1 %
O2 SAT: 96 %
PATIENT TEMPERATURE: 91.5
PO2 ART: 71.7 mmHg — AB (ref 80.0–100.0)
TCO2: 22.6 mmol/L (ref 0–100)
pCO2 arterial: 36.8 mmHg (ref 35.0–45.0)
pH, Arterial: 7.435 (ref 7.350–7.450)

## 2013-04-13 LAB — LIPASE, BLOOD: LIPASE: 18 U/L (ref 11–59)

## 2013-04-13 LAB — URINALYSIS W MICROSCOPIC + REFLEX CULTURE
BILIRUBIN URINE: NEGATIVE
Glucose, UA: NEGATIVE mg/dL
Hgb urine dipstick: NEGATIVE
Ketones, ur: NEGATIVE mg/dL
LEUKOCYTES UA: NEGATIVE
NITRITE: NEGATIVE
Protein, ur: NEGATIVE mg/dL
UROBILINOGEN UA: 1 mg/dL (ref 0.0–1.0)
pH: 6 (ref 5.0–8.0)

## 2013-04-13 LAB — CBC WITH DIFFERENTIAL/PLATELET
BASOS PCT: 0 % (ref 0–1)
Basophils Absolute: 0 10*3/uL (ref 0.0–0.1)
EOS ABS: 0 10*3/uL (ref 0.0–0.7)
EOS PCT: 0 % (ref 0–5)
HCT: 31.4 % — ABNORMAL LOW (ref 39.0–52.0)
Hemoglobin: 10.8 g/dL — ABNORMAL LOW (ref 13.0–17.0)
LYMPHS ABS: 0.4 10*3/uL — AB (ref 0.7–4.0)
Lymphocytes Relative: 3 % — ABNORMAL LOW (ref 12–46)
MCH: 31.1 pg (ref 26.0–34.0)
MCHC: 34.4 g/dL (ref 30.0–36.0)
MCV: 90.5 fL (ref 78.0–100.0)
Monocytes Absolute: 0.3 10*3/uL (ref 0.1–1.0)
Monocytes Relative: 2 % — ABNORMAL LOW (ref 3–12)
NEUTROS PCT: 94 % — AB (ref 43–77)
Neutro Abs: 12 10*3/uL — ABNORMAL HIGH (ref 1.7–7.7)
Platelets: 323 10*3/uL (ref 150–400)
RBC: 3.47 MIL/uL — AB (ref 4.22–5.81)
RDW: 14.2 % (ref 11.5–15.5)
WBC: 12.7 10*3/uL — ABNORMAL HIGH (ref 4.0–10.5)

## 2013-04-13 LAB — COMPREHENSIVE METABOLIC PANEL
ALBUMIN: 3 g/dL — AB (ref 3.5–5.2)
ALT: 6 U/L (ref 0–53)
AST: 13 U/L (ref 0–37)
Alkaline Phosphatase: 72 U/L (ref 39–117)
BILIRUBIN TOTAL: 0.4 mg/dL (ref 0.3–1.2)
BUN: 18 mg/dL (ref 6–23)
CHLORIDE: 94 meq/L — AB (ref 96–112)
CO2: 19 mEq/L (ref 19–32)
CREATININE: 1.13 mg/dL (ref 0.50–1.35)
Calcium: 10.5 mg/dL (ref 8.4–10.5)
GFR calc Af Amer: 76 mL/min — ABNORMAL LOW (ref >90)
GFR calc non Af Amer: 66 mL/min — ABNORMAL LOW (ref >90)
Glucose, Bld: 243 mg/dL — ABNORMAL HIGH (ref 70–99)
Potassium: 4.1 mEq/L (ref 3.7–5.3)
Sodium: 136 mEq/L — ABNORMAL LOW (ref 137–147)
TOTAL PROTEIN: 7.3 g/dL (ref 6.0–8.3)

## 2013-04-13 LAB — HEMOGLOBIN A1C
Hgb A1c MFr Bld: 6.7 % — ABNORMAL HIGH (ref <5.7)
Mean Plasma Glucose: 146 mg/dL — ABNORMAL HIGH (ref <117)

## 2013-04-13 LAB — TROPONIN I: Troponin I: <0.3 ng/mL (ref <0.30)

## 2013-04-13 LAB — GLUCOSE, CAPILLARY: Glucose-Capillary: 172 mg/dL — ABNORMAL HIGH (ref 70–99)

## 2013-04-13 LAB — TSH: TSH: 1.646 u[IU]/mL (ref 0.350–4.500)

## 2013-04-13 MED ORDER — METHYLPREDNISOLONE SODIUM SUCC 125 MG IJ SOLR
125.0000 mg | INTRAMUSCULAR | Status: AC
  Administered 2013-04-13: 125 mg via INTRAVENOUS
  Filled 2013-04-13: qty 2

## 2013-04-13 MED ORDER — GUAIFENESIN ER 600 MG PO TB12
600.0000 mg | ORAL_TABLET | Freq: Two times a day (BID) | ORAL | Status: DC
  Administered 2013-04-13 – 2013-04-17 (×8): 600 mg via ORAL
  Filled 2013-04-13 (×9): qty 1

## 2013-04-13 MED ORDER — GUAIFENESIN-DM 100-10 MG/5ML PO SYRP
5.0000 mL | ORAL_SOLUTION | ORAL | Status: DC | PRN
  Administered 2013-04-16: 23:00:00 5 mL via ORAL
  Filled 2013-04-13: qty 10

## 2013-04-13 MED ORDER — ONDANSETRON HCL 4 MG/2ML IJ SOLN: 4.0000 mg | Freq: Four times a day (QID) | INTRAMUSCULAR | Status: DC | PRN

## 2013-04-13 MED ORDER — CLOPIDOGREL BISULFATE 75 MG PO TABS
75.0000 mg | ORAL_TABLET | Freq: Every day | ORAL | Status: DC
  Administered 2013-04-14 – 2013-04-17 (×4): 75 mg via ORAL
  Filled 2013-04-13 (×4): qty 1

## 2013-04-13 MED ORDER — INSULIN ASPART 100 UNIT/ML ~~LOC~~ SOLN
0.0000 [IU] | Freq: Three times a day (TID) | SUBCUTANEOUS | Status: DC
  Administered 2013-04-14: 15:00:00 2 [IU] via SUBCUTANEOUS
  Administered 2013-04-15: 15:00:00 1 [IU] via SUBCUTANEOUS
  Administered 2013-04-15: 2 [IU] via SUBCUTANEOUS
  Administered 2013-04-16 (×2): 1 [IU] via SUBCUTANEOUS

## 2013-04-13 MED ORDER — SODIUM CHLORIDE 0.9 % IV SOLN
INTRAVENOUS | Status: AC
  Administered 2013-04-13 (×2): via INTRAVENOUS

## 2013-04-13 MED ORDER — NICOTINE 21 MG/24HR TD PT24
21.0000 mg | MEDICATED_PATCH | Freq: Every day | TRANSDERMAL | Status: DC
  Administered 2013-04-13 – 2013-04-17 (×5): 21 mg via TRANSDERMAL
  Filled 2013-04-13 (×5): qty 1

## 2013-04-13 MED ORDER — ACETAMINOPHEN 650 MG RE SUPP: 650.0000 mg | Freq: Four times a day (QID) | RECTAL | Status: DC | PRN

## 2013-04-13 MED ORDER — IOHEXOL 350 MG/ML SOLN
100.0000 mL | Freq: Once | INTRAVENOUS | Status: AC | PRN
  Administered 2013-04-13: 100 mL via INTRAVENOUS

## 2013-04-13 MED ORDER — DOXYCYCLINE HYCLATE 100 MG PO TABS
100.0000 mg | ORAL_TABLET | Freq: Two times a day (BID) | ORAL | Status: DC
  Administered 2013-04-13 – 2013-04-17 (×8): 100 mg via ORAL
  Filled 2013-04-13 (×9): qty 1

## 2013-04-13 MED ORDER — ALBUTEROL SULFATE (2.5 MG/3ML) 0.083% IN NEBU
5.0000 mg | INHALATION_SOLUTION | Freq: Once | RESPIRATORY_TRACT | Status: AC
  Administered 2013-04-13: 5 mg via RESPIRATORY_TRACT
  Filled 2013-04-13: qty 6

## 2013-04-13 MED ORDER — ENOXAPARIN SODIUM 40 MG/0.4ML ~~LOC~~ SOLN
40.0000 mg | SUBCUTANEOUS | Status: DC
  Administered 2013-04-13 – 2013-04-16 (×4): 40 mg via SUBCUTANEOUS
  Filled 2013-04-13 (×5): qty 0.4

## 2013-04-13 MED ORDER — ALBUTEROL SULFATE (2.5 MG/3ML) 0.083% IN NEBU
2.5000 mg | INHALATION_SOLUTION | Freq: Four times a day (QID) | RESPIRATORY_TRACT | Status: DC
  Administered 2013-04-13: 20:00:00 2.5 mg via RESPIRATORY_TRACT
  Filled 2013-04-13: qty 3

## 2013-04-13 MED ORDER — VANCOMYCIN HCL IN DEXTROSE 1-5 GM/200ML-% IV SOLN
1000.0000 mg | INTRAVENOUS | Status: AC
  Administered 2013-04-13: 1000 mg via INTRAVENOUS
  Filled 2013-04-13: qty 200

## 2013-04-13 MED ORDER — IPRATROPIUM BROMIDE 0.02 % IN SOLN
0.5000 mg | Freq: Four times a day (QID) | RESPIRATORY_TRACT | Status: DC
  Administered 2013-04-13: 20:00:00 0.5 mg via RESPIRATORY_TRACT
  Filled 2013-04-13: qty 2.5

## 2013-04-13 MED ORDER — ONDANSETRON HCL 4 MG PO TABS: 4.0000 mg | ORAL_TABLET | Freq: Four times a day (QID) | ORAL | Status: DC | PRN

## 2013-04-13 MED ORDER — AMLODIPINE BESYLATE 10 MG PO TABS
10.0000 mg | ORAL_TABLET | Freq: Every day | ORAL | Status: DC
  Administered 2013-04-14 – 2013-04-17 (×4): 10 mg via ORAL
  Filled 2013-04-13 (×4): qty 1

## 2013-04-13 MED ORDER — PIPERACILLIN-TAZOBACTAM 3.375 G IVPB 30 MIN
3.3750 g | INTRAVENOUS | Status: AC
  Administered 2013-04-13: 3.375 g via INTRAVENOUS
  Filled 2013-04-13: qty 50

## 2013-04-13 MED ORDER — SODIUM CHLORIDE 0.9 % IJ SOLN
3.0000 mL | Freq: Two times a day (BID) | INTRAMUSCULAR | Status: DC
  Administered 2013-04-13 – 2013-04-15 (×2): 3 mL via INTRAVENOUS

## 2013-04-13 MED ORDER — MORPHINE SULFATE 4 MG/ML IJ SOLN
4.0000 mg | Freq: Once | INTRAMUSCULAR | Status: AC
  Administered 2013-04-13: 4 mg via INTRAVENOUS
  Filled 2013-04-13: qty 1

## 2013-04-13 MED ORDER — ALBUTEROL SULFATE (2.5 MG/3ML) 0.083% IN NEBU: 2.5000 mg | INHALATION_SOLUTION | RESPIRATORY_TRACT | Status: DC | PRN

## 2013-04-13 MED ORDER — SODIUM CHLORIDE 0.9 % IV BOLUS (SEPSIS)
1000.0000 mL | INTRAVENOUS | Status: AC
  Administered 2013-04-13: 1000 mL via INTRAVENOUS

## 2013-04-13 MED ORDER — LORAZEPAM 0.5 MG PO TABS
0.5000 mg | ORAL_TABLET | Freq: Once | ORAL | Status: AC
  Administered 2013-04-13: 23:00:00 0.5 mg via ORAL
  Filled 2013-04-13: qty 1

## 2013-04-13 MED ORDER — ACETAMINOPHEN 325 MG PO TABS: 650.0000 mg | ORAL_TABLET | Freq: Four times a day (QID) | ORAL | Status: DC | PRN

## 2013-04-13 MED ORDER — METHYLPREDNISOLONE SODIUM SUCC 40 MG IJ SOLR
40.0000 mg | Freq: Three times a day (TID) | INTRAMUSCULAR | Status: DC
  Administered 2013-04-13 – 2013-04-16 (×9): 40 mg via INTRAVENOUS
  Filled 2013-04-13 (×11): qty 1

## 2013-04-13 MED ORDER — VANCOMYCIN HCL 1000 MG IV SOLR: 15.0000 mg/kg | INTRAVENOUS | Status: DC

## 2013-04-13 NOTE — Progress Notes (Signed)
Patient transitioned to [place] 4L-saturations 95%. Pt states he feels much better. Skin warm and he did consume a good dinner.

## 2013-04-13 NOTE — ED Notes (Signed)
Patient transported to CT 

## 2013-04-13 NOTE — ED Notes (Signed)
I tried helping patient urinate. He couldn't go

## 2013-04-13 NOTE — ED Provider Notes (Signed)
CSN: [id number]     Arrival date & time 04/13/13  0935 History   First MD Initiated Contact with Patient 04/13/13 1005     Chief Complaint  Patient presents with  . Shortness of Breath   (Consider location/radiation/quality/duration/timing/severity/associated sxs/prior Treatment) Patient is a 68 y.o. male presenting with shortness of breath. The history is provided by the patient.  Shortness of Breath Severity:  Mild Onset quality:  Gradual Duration:  2 weeks Timing:  Constant Progression:  Worsening Chronicity:  New Context: URI   Relieved by:  Nothing Worsened by:  Nothing tried Ineffective treatments:  None tried Associated symptoms: abdominal pain, cough and sputum production (white)   Associated symptoms: no chest pain, no fever, no headaches, no neck pain and no vomiting     Past Medical History  Diagnosis Date  . Hyperlipidemia   . Hypertension   . COPD (chronic obstructive pulmonary disease)   . Stroke     left side weakness, ambulates with cane/ walker  . Right lower lobe lung mass   . Hypoxia   . Nodule of left lung   . Hx of radiation therapy 02/07/12-05/10/12    R central chest hilar,mediastinal region 63 gray, 35 fx  . Lung cancer 01/19/2012   Past Surgical History  Procedure Laterality Date  . Tonsillectomy     History reviewed. No pertinent family history. History  Substance Use Topics  . Smoking status: Current Every Day Smoker -- 2.00 packs/day for 50 years    Types: Cigarettes  . Smokeless tobacco: Never Used  . Alcohol Use: No    Review of Systems  Constitutional: Negative for fever.  HENT: Negative for drooling and rhinorrhea.   Eyes: Negative for pain.  Respiratory: Positive for cough, sputum production (white) and shortness of breath.   Cardiovascular: Negative for chest pain and leg swelling.  Gastrointestinal: Positive for abdominal pain. Negative for nausea, vomiting and diarrhea.  Genitourinary: Negative for dysuria and hematuria.   Musculoskeletal: Negative for gait problem and neck pain.  Skin: Negative for color change.  Neurological: Negative for numbness and headaches.  Hematological: Negative for adenopathy.  Psychiatric/Behavioral: Negative for behavioral problems.  All other systems reviewed and are negative.    Allergies  Review of patient's allergies indicates no known allergies.  Home Medications   Current Outpatient Rx  Name  Route  Sig  Dispense  Refill  . albuterol (PROVENTIL HFA;VENTOLIN HFA) 108 (90 BASE) MCG/ACT inhaler   Inhalation   Inhale 2 puffs into the lungs every 6 (six) hours as needed for wheezing.   3.7 Inhaler   2   . amLODipine (NORVASC) 10 MG tablet   Oral   Take 10 mg by mouth daily.         . clopidogrel (PLAVIX) 75 MG tablet   Oral   Take 75 mg by mouth daily.         . hydrochlorothiazide (HYDRODIURIL) 25 MG tablet   Oral   Take 25 mg by mouth daily.         Marland Kitchen HYDROcodone-acetaminophen (NORCO) 10-325 MG per tablet   Oral   Take 1 tablet by mouth every 6 (six) hours as needed.   40 tablet   0   . lisinopril (PRINIVIL,ZESTRIL) 10 MG tablet   Oral   Take 10 mg by mouth daily.         . potassium chloride SA (K-DUR,KLOR-CON) 20 MEQ tablet   Oral   Take 1 tablet (20 mEq total) by  mouth as directed. 45meq x 10 days   10 tablet   0    BP 133/96  Pulse 113  SpO2 96% Physical Exam  Nursing note and vitals reviewed. Constitutional: He is oriented to person, place, and time.  Thin appearing, shivering  HENT:  Head: Normocephalic and atraumatic.  Right Ear: External ear normal.  Left Ear: External ear normal.  Nose: Nose normal.  Mouth/Throat: Oropharynx is clear and moist. No oropharyngeal exudate.  Eyes: Conjunctivae and EOM are normal. Pupils are equal, round, and reactive to light.  Neck: Normal range of motion. Neck supple.  Cardiovascular: Regular rhythm, normal heart sounds and intact distal pulses.  Exam reveals no gallop and no friction  rub.   No murmur heard. HR 113  Pulmonary/Chest: Effort normal and breath sounds normal. No respiratory distress. He has no wheezes.  Abdominal: Soft. Bowel sounds are normal. He exhibits no distension. There is tenderness (non focal and diffuse mild abd pain). There is no rebound and no guarding.  Musculoskeletal: Normal range of motion. He exhibits no edema and no tenderness.  Neurological: He is alert and oriented to person, place, and time.  Skin: Skin is warm and dry.  Psychiatric: He has a normal mood and affect. His behavior is normal.    ED Course  Procedures (including critical care time) Labs Review Labs Reviewed  COMPREHENSIVE METABOLIC PANEL - Abnormal; Notable for the following:    Sodium 136 (*)    Chloride 94 (*)    Glucose, Bld 243 (*)    Albumin 3.0 (*)    GFR calc non Af Amer 66 (*)    GFR calc Af Amer 76 (*)    All other components within normal limits  URINALYSIS W MICROSCOPIC + REFLEX CULTURE - Abnormal; Notable for the following:    Specific Gravity, Urine >1.046 (*)    All other components within normal limits  CBC WITH DIFFERENTIAL - Abnormal; Notable for the following:    WBC 12.7 (*)    RBC 3.47 (*)    Hemoglobin 10.8 (*)    HCT 31.4 (*)    Neutrophils Relative % 94 (*)    Neutro Abs 12.0 (*)    Lymphocytes Relative 3 (*)    Lymphs Abs 0.4 (*)    Monocytes Relative 2 (*)    All other components within normal limits  BLOOD GAS, ARTERIAL - Abnormal; Notable for the following:    pO2, Arterial 71.7 (*)    Bicarbonate 25.6 (*)    All other components within normal limits  CULTURE, BLOOD (ROUTINE X 2)  CULTURE, BLOOD (ROUTINE X 2)  TROPONIN I  LIPASE, BLOOD  CBC WITH DIFFERENTIAL  TSH  HEMOGLOBIN E9B  BASIC METABOLIC PANEL  CBC   Imaging Review Ct Angio Chest Pe W/cm &/or Wo Cm  04/13/2013   CLINICAL DATA:  Shortness of breath and history of lung carcinoma.  EXAM: CT ANGIOGRAPHY CHEST WITH CONTRAST  TECHNIQUE: Multidetector CT imaging of the  chest was performed using the standard protocol during bolus administration of intravenous contrast. Multiplanar CT image reconstructions including MIPs were obtained to evaluate the vascular anatomy.  CONTRAST:  169mL OMNIPAQUE IOHEXOL 350 MG/ML SOLN  COMPARISON:  Multiple prior CT exams of the chest. The latest is dated 09/18/2012.  FINDINGS: The pulmonary arteries are well opacified at the time of the examination. There is no evidence of pulmonary embolism.  There is significant progression of lung carcinoma and metastatic disease in the chest. The primary mass  centered in the superior segment of the right lower lobe shows significant interval enlargement with maximal transverse dimensions of approximately 5.7 x 6.4 cm. Adjacent parenchymal changes likely reflect post radiation therapy changes. There may be lymphangitic spread of tumor in the right lower lobe, as well, which is strongly suspected given the multitude of other metastatic lesions identified.  Innumerable new metastatic nodules are identified throughout all lobes of both lungs. Largest measured nodules are a 1.2 cm nodule in the right middle lobe, 1.3 cm nodule at the posterior basilar right lower lobe and 1.3 cm nodule in the left lower lobe. There are too many new metastatic nodules to count.  Progressive lymphadenopathy is also identified in the right hilum and mediastinum. Largest right hilar lymph node measures approximately 2.3 x 3.0 cm. Subcarinal lymphadenopathy measures 1.8 cm in short axis.  There is a trace amount of pleural fluid on the right. No pericardial fluid is identified. The heart size is normal. No bony lesions are identified.  Review of the MIP images confirms the above findings.  IMPRESSION: Marked progression of metastatic lung carcinoma in the chest with significant enlargement of the primary right lower lobe mass as well as innumerable new metastatic nodules throughout both lungs. There may also be lymphangitic spread of  tumor in the right lower lobe.   Electronically Signed   By: Aletta Edouard M.D.   On: 04/13/2013 13:05   Dg Chest Port 1 View  04/13/2013   CLINICAL DATA:  Shortness of breath.  Lung cancer.  EXAM: PORTABLE CHEST - 1 VIEW  COMPARISON:  Chest CT 09/18/2012.  FINDINGS: The a power port is in good position. The cardiac silhouette, mediastinal and hilar contours are stable. There is a large right hilar mass and right hilar adenopathy. Underlying advanced emphysematous changes with increased interstitial markings in both lungs, right greater than left which could reflect asymmetric edema. Could not exclude the possibly of interstitial spread of tumor on the right. Suspect small pulmonary nodules No definite pleural effusion. The bony thorax is grossly intact.  IMPRESSION: Enlarging right hilar mass and hilar adenopathy.  Increased interstitial markings in both lungs could reflect asymmetric noncardiogenic pulmonary edema. Could not exclude the possibility of interstitial spread of tumor in the right lung.  Underlying emphysematous changes.  Suspect pulmonary metastasis.   Electronically Signed   By: Kalman Jewels M.D.   On: 04/13/2013 11:07    EKG Interpretation    Date/Time:  Saturday April 13 2013 10:19:05 EST Ventricular Rate:  102 PR Interval:  208 QRS Duration: 87 QT Interval:  371 QTC Calculation: 483 R Axis:   -33 Text Interpretation:  Sinus tachycardia Borderline prolonged PR interval LAE, consider biatrial enlargement Left axis deviation Low voltage, precordial leads Borderline prolonged QT interval Artifact in lead(s) I II III aVL aVF V1 V2 V4 V6 Confirmed by Cash Duce  MD, Deari Sessler ([id number]) on 04/13/2013 11:07:46 AM           CRITICAL CARE Performed by: Pamella Pert, S Total critical care time: 30 min Critical care time was exclusive of separately billable procedures and treating other patients. Critical care was necessary to treat or prevent imminent or life-threatening  deterioration. Critical care was time spent personally by me on the following activities: development of treatment plan with patient and/or surrogate as well as nursing, discussions with consultants, evaluation of patient's response to treatment, examination of patient, obtaining history from patient or surrogate, ordering and performing treatments and interventions, ordering and review of laboratory  studies, ordering and review of radiographic studies, pulse oximetry and re-evaluation of patient's condition.   MDM   1. Hypoxia   2. Leukocytosis   3. Lung cancer, unspecified laterality   4. Hypothermia, initial encounter   5. Confusion   6. Chest pain    10:17 AM 68 y.o. male with a history of lung cancer, COPD who presents with confusion and hypoxia. The patient states that he did not have heat in his house and only has a stand-alone heater in his bedroom. He states that he had gone to the drugstore to buy cigarettes. Per EMS report he was picked up for mild confusion. He is found to be hypothermic. On exam here he is alert and oriented x2, he does not know the year. He is hypothermic, mildly tachypneic and hypoxic requiring 3 L via nasal cannula to maintain oxygen saturation. He states that he has had a cough productive of white sputum for the last 2 weeks and some nonspecific vague abdominal pain for the last 3 weeks. We'll start with screening labs and imaging.  CT PE neg for PE. Increasing lung cancer burden which could explain hypoxia and cp. I decided to tx him empirically w/ Vanc/Zosyn d/t the hypoxia/hypothermia/tachypnea w/ elevated wbc. I ordered blood cultures as well. His temperature is now normal after warming and he is maintaining O2 sat of 97% on 50% venti mask.   Critical care documented in this hypoxic patient w/ sob, hypothermia (88.5 F on initial rectal temp) requiring frequent evaluations to monitor respiratory status while rewarming.    Blanchard Kelch, MD 04/13/13 [id number]

## 2013-04-13 NOTE — H&P (Signed)
[facility]  History and Physical  CORDON GASSETT ZOX:[id number] DOB: 28-Apr-1945 DOA: 04/13/2013  Referring physician: EDP PCP: No primary provider on file.  Outpatient Specialists:  1. Medical Oncology; Dr. Curt Bears  Chief Complaint: Shortness of breath  HPI: Benjamin Moss is a 68 y.o. 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 Patient is unable to provide clear history secondary to his confusion. He states that it was very cold and hence his friend called 911 to bring him here. However per EDP report, patient did not have heat in his house and only has a stand alone he doesn't his bedroom. He had gone to the drug store to buy cigarettes and was picked up by EMS for mild confusion and found to be hypothermic. Patient does complain of cough with intermittent white sputum, chest pain on coughing and intermittent dyspnea. He denies fever, chills, headache, earache, sore throat, nausea, vomiting, abdominal pain, diarrhea, constipation, urinary frequency or dysuria. He states that he eats regular consistency food. In the ED, initial rectal temperature 91.5 which normalized to 98.4 with warming blanket, hypoxic, glucose 243, albumin 3, WBC 12.7, hemoglobin 10.8, negative troponin and CTA chest shows no PE but shows marked progression of metastatic lung cancer. Hospitalist admission requested.   Review of Systems: All systems reviewed and apart from history of presenting illness, are negative.  Past Medical History  Diagnosis Date  . Hyperlipidemia   . Hypertension   . COPD (chronic obstructive pulmonary disease)   . Stroke     left side weakness, ambulates with cane/ walker  . Right lower lobe lung mass   . Hypoxia   . Nodule of left lung   . Hx of radiation therapy 02/07/12-05/10/12    R central  chest hilar,mediastinal region 63 gray, 35 fx  . Lung cancer 01/19/2012   Past Surgical History  Procedure Laterality Date  . Tonsillectomy     Social History:  reports that he has been smoking Cigarettes.  He has a 100 pack-year smoking history. He has never used smokeless tobacco. He reports that he does not drink alcohol or use illicit drugs. Single. States that he is independent of activities of daily living.  No Known Allergies  History reviewed. No pertinent family history. denies family history.   Prior to Admission medications   Medication Sig Start Date End Date Taking? Authorizing Provider  albuterol (PROVENTIL HFA;VENTOLIN HFA) 108 (90 BASE) MCG/ACT inhaler Inhale 2 puffs into the lungs every 6 (six) hours as needed for wheezing. 06/07/12   Blair Promise, MD  amLODipine (NORVASC) 10 MG tablet Take 10 mg by mouth daily.    Historical Provider, MD  clopidogrel (PLAVIX) 75 MG tablet Take 75 mg by mouth daily.    Historical Provider, MD  hydrochlorothiazide (HYDRODIURIL) 25 MG tablet Take 25 mg by mouth daily.    Historical Provider, MD  HYDROcodone-acetaminophen (NORCO) 10-325 MG per tablet Take 1 tablet by mouth every 6 (six) hours as needed. 04/20/12   Carlton Adam, PA-C  potassium chloride SA (K-DUR,KLOR-CON) 20 MEQ tablet Take 1 tablet (20 mEq total) by mouth as directed. 63meq x 10 days 09/19/12   Curt Bears, MD   Physical Exam: Filed Vitals:   04/13/13 1120 04/13/13 1255 04/13/13 1258 04/13/13 1418  BP: 125/84   111/78  Pulse: 81   87  Temp: 91.5 F (  33.1 C) 98.2 F (36.8 C)  98.4 F (36.9 C)  TempSrc: Rectal Rectal  Oral  Resp: 19  35 20  SpO2: 100%  81% 99%     General exam: Small built thin cachectic, chronically ill looking male lying comfortably on the gurney in no obvious distress.  Head, eyes and ENT: Nontraumatic and normocephalic. Pupils equally reacting to light and accommodation. Oral mucosa dry but without any other acute findings. Missing  several teeth and multiple teeth with dental caries.  Neck: Supple. No JVD, carotid bruit or thyromegaly.  Lymphatics: No lymphadenopathy.  Respiratory system: Reduced breath sounds bilaterally with scattered occasional rhonchi and few basal crackles. No increased work of breathing. Porta cath on right anterior chest. Hyperpigmentation of skin of anterior chest wall and upper abdomen.  Cardiovascular system: S1 and S2 heard, RRR. No JVD, murmurs, gallops, clicks or pedal edema.  Gastrointestinal system: Abdomen is scaphoid, soft and nontender. Normal bowel sounds heard. No organomegaly or masses appreciated.  Central nervous system: Alert and oriented to self and place but not time (May 19 or 20). No focal neurological deficits.  Extremities: Symmetric 5 x 5 power. Peripheral pulses symmetrically felt.   Skin: No rashes or acute findings.  Musculoskeletal system: Negative exam.  Psychiatry: Pleasant and cooperative.   Labs on Admission:  Basic Metabolic Panel:  Recent Labs Lab 04/13/13 1025  NA 136*  K 4.1  CL 94*  CO2 19  GLUCOSE 243*  BUN 18  CREATININE 1.13  CALCIUM 10.5   Liver Function Tests:  Recent Labs Lab 04/13/13 1025  AST 13  ALT 6  ALKPHOS 72  BILITOT 0.4  PROT 7.3  ALBUMIN 3.0*    Recent Labs Lab 04/13/13 1025  LIPASE 18   No results found for this basename: AMMONIA,  in the last 168 hours CBC:  Recent Labs Lab 04/13/13 1233  WBC 12.7*  NEUTROABS 12.0*  HGB 10.8*  HCT 31.4*  MCV 90.5  PLT 323   Cardiac Enzymes:  Recent Labs Lab 04/13/13 1025  TROPONINI <0.30    BNP (last 3 results) No results found for this basename: PROBNP,  in the last 8760 hours CBG: No results found for this basename: GLUCAP,  in the last 168 hours  Radiological Exams on Admission: Ct Angio Chest Pe W/cm &/or Wo Cm  04/13/2013   CLINICAL DATA:  Shortness of breath and history of lung carcinoma.  EXAM: CT ANGIOGRAPHY CHEST WITH CONTRAST  TECHNIQUE:  Multidetector CT imaging of the chest was performed using the standard protocol during bolus administration of intravenous contrast. Multiplanar CT image reconstructions including MIPs were obtained to evaluate the vascular anatomy.  CONTRAST:  124mL OMNIPAQUE IOHEXOL 350 MG/ML SOLN  COMPARISON:  Multiple prior CT exams of the chest. The latest is dated 09/18/2012.  FINDINGS: The pulmonary arteries are well opacified at the time of the examination. There is no evidence of pulmonary embolism.  There is significant progression of lung carcinoma and metastatic disease in the chest. The primary mass centered in the superior segment of the right lower lobe shows significant interval enlargement with maximal transverse dimensions of approximately 5.7 x 6.4 cm. Adjacent parenchymal changes likely reflect post radiation therapy changes. There may be lymphangitic spread of tumor in the right lower lobe, as well, which is strongly suspected given the multitude of other metastatic lesions identified.  Innumerable new metastatic nodules are identified throughout all lobes of both lungs. Largest measured nodules are a 1.2 cm nodule in the right  middle lobe, 1.3 cm nodule at the posterior basilar right lower lobe and 1.3 cm nodule in the left lower lobe. There are too many new metastatic nodules to count.  Progressive lymphadenopathy is also identified in the right hilum and mediastinum. Largest right hilar lymph node measures approximately 2.3 x 3.0 cm. Subcarinal lymphadenopathy measures 1.8 cm in short axis.  There is a trace amount of pleural fluid on the right. No pericardial fluid is identified. The heart size is normal. No bony lesions are identified.  Review of the MIP images confirms the above findings.  IMPRESSION: Marked progression of metastatic lung carcinoma in the chest with significant enlargement of the primary right lower lobe mass as well as innumerable new metastatic nodules throughout both lungs. There may  also be lymphangitic spread of tumor in the right lower lobe.   Electronically Signed   By: Aletta Edouard M.D.   On: 04/13/2013 13:05   Dg Chest Port 1 View  04/13/2013   CLINICAL DATA:  Shortness of breath.  Lung cancer.  EXAM: PORTABLE CHEST - 1 VIEW  COMPARISON:  Chest CT 09/18/2012.  FINDINGS: The a power port is in good position. The cardiac silhouette, mediastinal and hilar contours are stable. There is a large right hilar mass and right hilar adenopathy. Underlying advanced emphysematous changes with increased interstitial markings in both lungs, right greater than left which could reflect asymmetric edema. Could not exclude the possibly of interstitial spread of tumor on the right. Suspect small pulmonary nodules No definite pleural effusion. The bony thorax is grossly intact.  IMPRESSION: Enlarging right hilar mass and hilar adenopathy.  Increased interstitial markings in both lungs could reflect asymmetric noncardiogenic pulmonary edema. Could not exclude the possibility of interstitial spread of tumor in the right lung.  Underlying emphysematous changes.  Suspect pulmonary metastasis.   Electronically Signed   By: Kalman Jewels M.D.   On: 04/13/2013 11:07    EKG: Independently reviewed. Extensive tremor artifacts likely secondary to hypokalemia. Sinus tachycardia at 102 beats per minute, LAD in no obvious acute changes appreciated.  Assessment/Plan Active Problems:   COPD   Hyperlipidemia   Hypertension   Lung cancer   Leukocytosis   Hypothermia   Acute respiratory failure   Anemia   Acute encephalopathy   Acute hypoxic respiratory failure - Secondary to metastatic lung cancer and COPD. No PE on CT chest. - Titrate oxygen to keep saturation greater than 90%, bronchodilator nebulizations and IV Solu-Medrol.  Hypothermia - Likely secondary to cold exposure and less likely due to sepsis. Patient is status post single dose of IV vancomycin and Zosyn. CT chest not suggestive of  infection. Urine microscopy negative. - Improved after warming blanket-continue when necessary. Followup blood culture reports. Check TSH. Hold antibiotics for now.  Acute encephalopathy - Baseline mental status is not known. - Mental status changes could be multifactorial secondary to hypothermia & hypoxemia. No obvious focal deficits. If mental status does not improve, may consider CT head to rule out metastasis.  Dehydration - IV fluids  Metastatic small small cell lung cancer Will alert patient's primary oncologist of admission. It appears as though patient has not kept her followup appointment. He states that he did not go back because they were "through with me".   Mild COPD exacerbation - Management as above and will add oral doxycycline.   Hyperglycemia - Check hemoglobin A1c. SSI.  Tobacco abuse - Cessation counseling and patient oriented. Nicotine patch.   Hypertension - Controlled  Anemia  -  Likely from chronic disease. Follow CBCs  History of CVA - Continue Plavix. Dysphagia 3 diet until swallow evaluation by speech therapy.   Code Status:  presumed full code. This will need to be readdressed when patient is coherent. Patient states that he has no close family to discuss with.   Family Communication:  none at bedside.   Disposition Plan: To be determined    Time spent:  72 minutes   HONGALGI,ANAND, MD, FACP, FHM. [facility] Pager [phone]  If 7PM-7AM, please contact night-coverage [email or web address] Password [email or web address] 04/13/2013, 3:25 PM

## 2013-04-13 NOTE — ED Notes (Signed)
Bed: RESA Expected date:  Expected time:  Means of arrival:  Comments: EMS- Cancer Pt, AMS

## 2013-04-13 NOTE — Progress Notes (Signed)
Report taken from ED nurse.

## 2013-04-13 NOTE — ED Notes (Signed)
Patient set up in urinal, held urinal in place, pt unable to void.

## 2013-04-13 NOTE — ED Notes (Signed)
Patient again attempted to urinate. Penis placed inside urinal again, patient unable to void.

## 2013-04-13 NOTE — ED Notes (Addendum)
Per EMS patient with Hx of lung cancer found outside wandering around, seemed confused initially but EMS suspects patient was too cold to interact. Per EMS patient was cold to touch, with a bag of 8 packs of cigarettes. Per EMS no wheezing, only course rhonchi, patient has no power or heat at home. Per EMS patient is currently A & O x 4 but difficult to understand.

## 2013-04-13 NOTE — ED Notes (Signed)
Patient stated he doesn't have to urinate.

## 2013-04-14 ENCOUNTER — Inpatient Hospital Stay (HOSPITAL_COMMUNITY): Payer: Medicare HMO

## 2013-04-14 DIAGNOSIS — C3490 Malignant neoplasm of unspecified part of unspecified bronchus or lung: Principal | ICD-10-CM

## 2013-04-14 DIAGNOSIS — E876 Hypokalemia: Secondary | ICD-10-CM

## 2013-04-14 DIAGNOSIS — C801 Malignant (primary) neoplasm, unspecified: Secondary | ICD-10-CM

## 2013-04-14 LAB — GLUCOSE, CAPILLARY
GLUCOSE-CAPILLARY: 178 mg/dL — AB (ref 70–99)
GLUCOSE-CAPILLARY: 192 mg/dL — AB (ref 70–99)
Glucose-Capillary: 99 mg/dL (ref 70–99)
Glucose-Capillary: 110 mg/dL — ABNORMAL HIGH (ref 70–99)

## 2013-04-14 LAB — BASIC METABOLIC PANEL
BUN: 20 mg/dL (ref 6–23)
CHLORIDE: 103 meq/L (ref 96–112)
CO2: 23 mEq/L (ref 19–32)
Calcium: 9 mg/dL (ref 8.4–10.5)
Creatinine, Ser: 0.9 mg/dL (ref 0.50–1.35)
GFR calc Af Amer: >90 mL/min (ref >90)
GFR calc non Af Amer: 87 mL/min — ABNORMAL LOW (ref >90)
GLUCOSE: 117 mg/dL — AB (ref 70–99)
POTASSIUM: 3.1 meq/L — AB (ref 3.7–5.3)
SODIUM: 139 meq/L (ref 137–147)

## 2013-04-14 LAB — CBC
HEMATOCRIT: 26.3 % — AB (ref 39.0–52.0)
HEMOGLOBIN: 9.3 g/dL — AB (ref 13.0–17.0)
MCH: 31.1 pg (ref 26.0–34.0)
MCHC: 35.4 g/dL (ref 30.0–36.0)
MCV: 88 fL (ref 78.0–100.0)
Platelets: 333 10*3/uL (ref 150–400)
RBC: 2.99 MIL/uL — AB (ref 4.22–5.81)
RDW: 14.3 % (ref 11.5–15.5)
WBC: 9 10*3/uL (ref 4.0–10.5)

## 2013-04-14 LAB — MAGNESIUM: Magnesium: 1.8 mg/dL (ref 1.5–2.5)

## 2013-04-14 MED ORDER — SODIUM CHLORIDE 0.9 % IJ SOLN
10.0000 mL | INTRAMUSCULAR | Status: DC | PRN
  Administered 2013-04-17: 12:00:00 10 mL

## 2013-04-14 MED ORDER — IPRATROPIUM-ALBUTEROL 0.5-2.5 (3) MG/3ML IN SOLN
3.0000 mL | Freq: Four times a day (QID) | RESPIRATORY_TRACT | Status: DC
  Administered 2013-04-14 (×3): 3 mL via RESPIRATORY_TRACT
  Filled 2013-04-14 (×3): qty 3

## 2013-04-14 MED ORDER — HYDROCODONE-ACETAMINOPHEN 5-325 MG PO TABS
1.0000 | ORAL_TABLET | Freq: Four times a day (QID) | ORAL | Status: DC | PRN
  Administered 2013-04-14 – 2013-04-15 (×2): 1 via ORAL
  Administered 2013-04-16: 2 via ORAL
  Administered 2013-04-16: 1 via ORAL
  Filled 2013-04-14 (×3): qty 1
  Filled 2013-04-14: qty 2

## 2013-04-14 MED ORDER — IPRATROPIUM-ALBUTEROL 0.5-2.5 (3) MG/3ML IN SOLN
3.0000 mL | Freq: Four times a day (QID) | RESPIRATORY_TRACT | Status: DC
  Administered 2013-04-14: 3 mL via RESPIRATORY_TRACT
  Filled 2013-04-14: qty 3

## 2013-04-14 MED ORDER — IOHEXOL 300 MG/ML  SOLN
100.0000 mL | Freq: Once | INTRAMUSCULAR | Status: AC | PRN
  Administered 2013-04-14: 13:00:00 100 mL via INTRAVENOUS

## 2013-04-14 MED ORDER — POTASSIUM CHLORIDE CRYS ER 20 MEQ PO TBCR
40.0000 meq | EXTENDED_RELEASE_TABLET | Freq: Once | ORAL | Status: AC
  Administered 2013-04-14: 15:00:00 40 meq via ORAL
  Filled 2013-04-14: qty 2

## 2013-04-14 NOTE — Progress Notes (Signed)
Patient brought back from CT and CT tech called RN to room to let RN know that patient's IV tubing 'exploded' during patient's CT.  Called IV nurse to see if it was ok to use port and hook up to new tubing or if she needed to come and check it first.  IV nurse stated it was okay to hook up port to new tubing and use port.  Have connected port to new tubing.  Will continue to monitor.

## 2013-04-14 NOTE — Progress Notes (Signed)
Clinical Social Work Department CLINICAL SOCIAL WORK PLACEMENT NOTE 04/14/2013  Patient:  Benjamin Moss, Benjamin Moss  Account Number:  [id number] Admit date:  04/13/2013  Clinical Social Worker:  Levie Heritage  Date/time:  04/14/2013 03:54 PM  Clinical Social Work is seeking post-discharge placement for this patient at the following level of care:   SKILLED NURSING   (*CSW will update this form in Epic as items are completed)   04/14/2013  Patient/family provided with [facility] Department of Clinical Social Work's list of facilities offering this level of care within the geographic area requested by the patient (or if unable, by the patient's family).  04/14/2013  Patient/family informed of their freedom to choose among providers that offer the needed level of care, that participate in Medicare, Medicaid or managed care program needed by the patient, have an available bed and are willing to accept the patient.  04/14/2013  Patient/family informed of [facility]' ownership interest in [facility], as well as of the fact that they are under no obligation to receive care at this facility.  PASARR submitted to EDS on 04/14/2013 PASARR number received from EDS on 04/14/2013  FL2 transmitted to all facilities in geographic area requested by pt/family on  04/14/2013 FL2 transmitted to all facilities within larger geographic area on   Patient informed that his/her managed care company has contracts with or will negotiate with  certain facilities, including the following:     Patient/family informed of bed offers received:   Patient chooses bed at  Physician recommends and patient chooses bed at    Patient to be transferred to  on   Patient to be transferred to facility by   The following physician request were entered in Epic:   Additional Comments:  [name], [place] [facility] [phone]

## 2013-04-14 NOTE — Evaluation (Signed)
Physical Therapy Evaluation Patient Details Name: Benjamin Moss MRN: [id number] DOB: [birth year] Today's Date: 04/14/2013 Time: 4098-1191 PT Time Calculation (min): 26 min  PT Assessment / Plan / Recommendation History of Present Illness  68 y.o. 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 Patient is unable to provide clear history secondary to his confusion. He states that it was very cold and hence his friend called 911 to bring him here. However per EDP report, patient did not have heat in his house and only has a stand alone he doesn't his bedroom. He had gone to the drug store to buy cigarettes and was picked up by EMS for mild confusion and found to be hypothermic. Patient does complain of cough with intermittent white sputum, chest pain on coughing and intermittent dyspnea. He denies fever, chills, headache, earache, sore throat, nausea, vomiting, abdominal pain, diarrhea, constipation, urinary frequency or dysuria. He states that he eats regular consistency food. In the ED, initial rectal temperature 91.5 which normalized to 98.4 with warming blanket, hypoxic, glucose 243, albumin 3, WBC 12.7, hemoglobin 10.8, negative troponin and CTA chest shows no PE but shows marked progression of metastatic lung cancer.   Clinical Impression  Patient continues to be unsafe to discharge home alone due to decreased safety with mobility and fall risk and will benefit from continued post-acute therapy. Currently recommending skilled nursing facility stay unless caregivers can provide 24 hour A.  Pt seems to have poor insight into his deficits.    PT Assessment  Patient needs continued PT services    Follow Up Recommendations  SNF    Does the patient have the potential to tolerate intense rehabilitation       Barriers to Discharge Decreased caregiver support      Equipment Recommendations  None recommended by PT    Recommendations for Other Services     Frequency Min 3X/week    Precautions / Restrictions Restrictions Weight Bearing Restrictions: No   Pertinent Vitals/Pain C/o headache      Mobility  Bed Mobility Overal bed mobility: Needs Assistance Bed Mobility: Supine to Sit Supine to sit: Min assist;HOB elevated Transfers Overall transfer level: Needs assistance Equipment used: 2 person hand held assist Transfers: Sit to/from Stand Sit to Stand: +2 physical assistance;Mod assist Ambulation/Gait Ambulation/Gait assistance: +2 physical assistance;Mod assist Ambulation Distance (Feet): 35 Feet Assistive device: 2 person hand held assist Gait Pattern/deviations: Narrow base of support;Scissoring;Ataxic General Gait Details: Very unsteady with gait with scissoring, narrow BOS and ataxic gait pattern with 2 HHA.  amb with o2 at 4 L/min and it dropped to 82%.  In recliner worked on pursed lip breathing and it slowly increased to 89-90%.  Nursing notified.    Exercises     PT Diagnosis: Abnormality of gait  PT Problem List: Decreased balance;Decreased mobility;Cardiopulmonary status limiting activity;Decreased activity tolerance PT Treatment Interventions: Gait training;Functional mobility training;Therapeutic activities;Therapeutic exercise     PT Goals(Current goals can be found in the care plan section) Acute Rehab PT Goals Patient Stated Goal: Pt agreeable to PT PT Goal Formulation: With patient Time For Goal Achievement: 04/28/13 Potential to Achieve Goals: Good  Visit Information  Last PT Received On: 04/14/13 Assistance Needed: +2 History of Present Illness: 68 y.o. male with history of non-small cell lung cancer, status post concurrent chemoradiation, smoker, COPD, hypertension, hyperlipidemia, CVA with with residual left hemiparesis and  dysphagia, presented with  difficulty breathing and confusion. Patient is unable to provide clear history secondary to his confusion. He states that it was very cold and hence his friend called 911 to bring him here. However per EDP report, patient did not have heat in his house and only has a stand alone he doesn't his bedroom. He had gone to the drug store to buy cigarettes and was picked up by EMS for mild confusion and found to be hypothermic. Patient does complain of cough with intermittent white sputum, chest pain on coughing and intermittent dyspnea. He denies fever, chills, headache, earache, sore throat, nausea, vomiting, abdominal pain, diarrhea, constipation, urinary frequency or dysuria. He states that he eats regular consistency food. In the ED, initial rectal temperature 91.5 which normalized to 98.4 with warming blanket, hypoxic, glucose 243, albumin 3, WBC 12.7, hemoglobin 10.8, negative troponin and CTA chest shows no PE but shows marked progression of metastatic lung cancer.        Prior Functioning       Cognition  Cognition Arousal/Alertness: Awake/alert Behavior During Therapy: Flat affect Overall Cognitive Status: Within Functional Limits for tasks assessed    Extremity/Trunk Assessment Lower Extremity Assessment Lower Extremity Assessment: Generalized weakness Cervical / Trunk Assessment Cervical / Trunk Assessment: Kyphotic   Balance Balance Overall balance assessment: Needs assistance Standing balance-Leahy Scale: Zero Standing balance comment: narrow BOS  End of Session PT - End of Session Equipment Utilized During Treatment: Gait belt Activity Tolerance: Treatment limited secondary to medical complications (Comment) (decreased o2 sats) Patient left: in chair;with chair alarm set;with call bell/phone within reach Nurse Communication: Mobility status;Other (comment) (o2 sats)  GP     Saryn Cherry LUBECK 04/14/2013, 12:45 PM

## 2013-04-14 NOTE — Progress Notes (Signed)
Have attempted to page SLP twice today to determine whether or not patient will be seen and evaluated by them today.  Have not gotten a response.   Per Dr. Janelle Floor request, will not keep patient NPO any longer today per policy for swallow evaluation, but will make NPO at midnight if SLP has still not rounded on patient.  Will continue to monitor.

## 2013-04-14 NOTE — Progress Notes (Signed)
[facility] PROGRESS NOTE    Benjamin Moss SAY:[id number] DOB: [birth year] DOA: 04/13/2013 PCP: No primary provider on file.  HPI/Brief narrative 68 y.o. 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 CT chest showed marked progression of metastatic lung cancer.   Assessment/Plan:  Acute hypoxic respiratory failure  - Secondary to metastatic lung cancer and COPD. No PE on CT chest.  - Continue bronchodilator nebulizations and IV Solu-Medrol.  - Improved.  Hypothermia  - Likely secondary to cold exposure and less likely due to sepsis. Patient is status post single dose of IV vancomycin and Zosyn. CT chest not suggestive of infection. Urine microscopy negative.  - Improved after warming blanket-continue when necessary. Followup blood culture reports.  TSH normal. Hold antibiotics for now.  - Resolved   Acute encephalopathy  -  mental status at baseline. Resolved  - Mental status changes could be multifactorial secondary to hypothermia & hypoxemia. No obvious focal deficits.  Follow CT head to rule out brain metastases: Patient complains of headache ongoing for 4 years since stroke.   Dehydration  - IV fluids. Improved  Metastatic small small cell lung cancer  Will alert patient's primary oncologist of admission. It appears as though patient has not kept her followup appointment. He states that he did not go back because they were "through with me".   Mild COPD exacerbation  - Management as above and will add oral doxycycline.  -  improving   Hyperglycemia/Type II DM-new diagnosis  -  hemoglobin A1c: 6.7. SSI.   Tobacco abuse  - Cessation counseling and patient oriented. Nicotine patch.   Hypertension  - Controlled   Anemia   - Likely from chronic disease. Follow CBCs   History of CVA  - Continue Plavix. Dysphagia 3 diet until swallow evaluation by speech therapy.  Hypokalemia  - replace and follow  -  magnesium normal   Code Status:  Full Family Communication:  discussed with the patient's neighbor/friend Mr. Timmothy Sours, at bedside-only responsible person for patient  Disposition Plan: ? Placement when medically stable. Per Mr. Mitzie Na, no 24/7 supervision    Consultants:  None   Procedures:  None   Antibiotics:  Oral doxycycline 1/10 >   Subjective:  patient feels better. Complains of headache-says that he has this since his stroke 4 years ago. Per friend, takes frequent pain medications OTC for same. Breathing is better.   Objective: Filed Vitals:   04/13/13 2300 04/14/13 0148 04/14/13 0526 04/14/13 0728  BP: 110/70  117/68   Pulse:   72   Temp: 98.2 F (36.8 C)  98.1 F (36.7 C)   TempSrc: Oral  Oral   Resp: 18  18   Height:      Weight:   47.6 kg (104 lb 15 oz)   SpO2: 98% 95% 96% 94%    Intake/Output Summary (Last 24 hours) at 04/14/13 1152 Last data filed at 04/14/13 0953  Gross per 24 hour  Intake   1980 ml  Output    850 ml  Net   1130 ml   Filed Weights   04/13/13 2001 04/14/13 0526  Weight: 47.6 kg (104 lb 15 oz) 47.6 kg (104 lb 15 oz)     Exam:  General exam: Small built thin cachectic, chronically ill looking male, looks much improved  compared to yesterday, sitting up in bed eating breakfast. Comfortable. Respiratory system: Reduced breath sounds bilaterally. No wheezing or rhonchi. Few basal crackles. No increased work of breathing.  Cardiovascular system: S1 & S2 heard, RRR. No JVD, murmurs, gallops, clicks or pedal edema. Telemetry: Sinus rhythm  Gastrointestinal system: Abdomen is nondistended, soft and nontender. Normal bowel sounds heard. Central nervous system: Alert and oriented. No focal neurological deficits. Extremities: Right upper  extremity grade 5/5 power, bilateral lower extremities grade 4+/5 power. Left upper extremity grade 3/5 power- from prior stroke   Data Reviewed: Basic Metabolic Panel:  Recent Labs Lab 04/13/13 1025 04/14/13 0750  NA 136* 139  K 4.1 3.1*  CL 94* 103  CO2 19 23  GLUCOSE 243* 117*  BUN 18 20  CREATININE 1.13 0.90  CALCIUM 10.5 9.0  MG  --  1.8   Liver Function Tests:  Recent Labs Lab 04/13/13 1025  AST 13  ALT 6  ALKPHOS 72  BILITOT 0.4  PROT 7.3  ALBUMIN 3.0*    Recent Labs Lab 04/13/13 1025  LIPASE 18   No results found for this basename: AMMONIA,  in the last 168 hours CBC:  Recent Labs Lab 04/13/13 1233 04/14/13 0750  WBC 12.7* 9.0  NEUTROABS 12.0*  --   HGB 10.8* 9.3*  HCT 31.4* 26.3*  MCV 90.5 88.0  PLT 323 333   Cardiac Enzymes:  Recent Labs Lab 04/13/13 1025  TROPONINI <0.30   BNP (last 3 results) No results found for this basename: PROBNP,  in the last 8760 hours CBG:  Recent Labs Lab 04/13/13 2159 04/14/13 0741  GLUCAP 172* 99    No results found for this or any previous visit (from the past 240 hour(s)).    Additional labs: 1. TSH: 1.646  2.  Hemoglobin A1c: 6.7      Studies: Ct Angio Chest Pe W/cm &/or Wo Cm  04/13/2013   CLINICAL DATA:  Shortness of breath and history of lung carcinoma.  EXAM: CT ANGIOGRAPHY CHEST WITH CONTRAST  TECHNIQUE: Multidetector CT imaging of the chest was performed using the standard protocol during bolus administration of intravenous contrast. Multiplanar CT image reconstructions including MIPs were obtained to evaluate the vascular anatomy.  CONTRAST:  142mL OMNIPAQUE IOHEXOL 350 MG/ML SOLN  COMPARISON:  Multiple prior CT exams of the chest. The latest is dated 09/18/2012.  FINDINGS: The pulmonary arteries are well opacified at the time of the examination. There is no evidence of pulmonary embolism.  There is significant progression of lung carcinoma and metastatic disease in the chest. The  primary mass centered in the superior segment of the right lower lobe shows significant interval enlargement with maximal transverse dimensions of approximately 5.7 x 6.4 cm. Adjacent parenchymal changes likely reflect post radiation therapy changes. There may be lymphangitic spread of tumor in the right lower lobe, as well, which is strongly suspected given the multitude of other metastatic lesions identified.  Innumerable new metastatic nodules are identified throughout all lobes of both lungs. Largest measured nodules are a 1.2 cm nodule in the right middle lobe, 1.3 cm nodule at the posterior basilar right lower lobe and 1.3 cm nodule in the left lower lobe. There are too many new metastatic nodules to count.  Progressive lymphadenopathy is also identified in the right hilum and mediastinum. Largest right hilar lymph node measures approximately 2.3 x 3.0 cm. Subcarinal lymphadenopathy measures 1.8 cm in short axis.  There is a trace amount of pleural fluid on the right.  No pericardial fluid is identified. The heart size is normal. No bony lesions are identified.  Review of the MIP images confirms the above findings.  IMPRESSION: Marked progression of metastatic lung carcinoma in the chest with significant enlargement of the primary right lower lobe mass as well as innumerable new metastatic nodules throughout both lungs. There may also be lymphangitic spread of tumor in the right lower lobe.   Electronically Signed   By: Aletta Edouard M.D.   On: 04/13/2013 13:05   Dg Chest Port 1 View  04/13/2013   CLINICAL DATA:  Shortness of breath.  Lung cancer.  EXAM: PORTABLE CHEST - 1 VIEW  COMPARISON:  Chest CT 09/18/2012.  FINDINGS: The a power port is in good position. The cardiac silhouette, mediastinal and hilar contours are stable. There is a large right hilar mass and right hilar adenopathy. Underlying advanced emphysematous changes with increased interstitial markings in both lungs, right greater than left  which could reflect asymmetric edema. Could not exclude the possibly of interstitial spread of tumor on the right. Suspect small pulmonary nodules No definite pleural effusion. The bony thorax is grossly intact.  IMPRESSION: Enlarging right hilar mass and hilar adenopathy.  Increased interstitial markings in both lungs could reflect asymmetric noncardiogenic pulmonary edema. Could not exclude the possibility of interstitial spread of tumor in the right lung.  Underlying emphysematous changes.  Suspect pulmonary metastasis.   Electronically Signed   By: Kalman Jewels M.D.   On: 04/13/2013 11:07        Scheduled Meds: . amLODipine  10 mg Oral Daily  . clopidogrel  75 mg Oral Daily  . doxycycline  100 mg Oral Q12H  . enoxaparin (LOVENOX) injection  40 mg Subcutaneous Q24H  . guaiFENesin  600 mg Oral BID  . insulin aspart  0-9 Units Subcutaneous TID WC  . ipratropium-albuterol  3 mL Nebulization Q6H WA  . methylPREDNISolone (SOLU-MEDROL) injection  40 mg Intravenous Q8H  . nicotine  21 mg Transdermal Daily  . potassium chloride  40 mEq Oral Once  . sodium chloride  3 mL Intravenous Q12H   Continuous Infusions:   Active Problems:   COPD   Hyperlipidemia   Hypertension   Lung cancer   Leukocytosis   Hypothermia   Acute respiratory failure   Anemia   Acute encephalopathy   Acute respiratory failure with hypoxia   Dehydration    Time spent:  15 minutes     Hridhaan Yohn, MD, FACP, FHM. [facility] Pager [phone]  If 7PM-7AM, please contact night-coverage [email or web address] Password [email or web address] 04/14/2013, 11:52 AM    LOS: 1 day

## 2013-04-14 NOTE — Progress Notes (Signed)
Patient not seen by SLP for swallow eval today.  Per Dr. Algis Liming, patient has been eating and taking medication, again without any obvious sign of aspiration.  Will make patient NPO after midnight per MD order for swallow eval tomorrow.

## 2013-04-14 NOTE — Progress Notes (Signed)
Clinical Social Work Department BRIEF PSYCHOSOCIAL ASSESSMENT 04/14/2013  Patient:  Benjamin Moss, Benjamin Moss     Account Number:  [id number]     Admit date:  04/13/2013  Clinical Social Worker:  Levie Heritage  Date/Time:  04/14/2013 03:49 PM  Referred by:  Physician  Date Referred:  04/14/2013 Referred for  SNF Placement   Other Referral:   Interview type:  Patient Other interview type:    PSYCHOSOCIAL DATA Living Status:  ALONE Admitted from facility:   Level of care:   Primary support name:  Mr. Mitzie Na Primary support relationship to patient:  NEIGHBOR Degree of support available:   adequate    CURRENT CONCERNS Current Concerns  Post-Acute Placement   Other Concerns:    SOCIAL WORK ASSESSMENT / PLAN Met with Pt to discuss d/c plans.    Pt confirmed that he lives alone and agreed with the PT and MD's recommendation that he go to SNF upon d/c.  Pt stated that he has been to a rehab facility in [place] before but that he can't remember the name.  Pt stated that, although he lives alone, he calls on his neighbor, Mr. Mitzie Na, if he needs anything.    CSW provided Pt with a SNF list and obtained Pt's permission to begin the SNF search on his behalf.    CSW thanked Pt for his time.   Assessment/plan status:  Psychosocial Support/Ongoing Assessment of Needs Other assessment/ plan:   Information/referral to community resources:   SNF list    PATIENT'S/FAMILY'S RESPONSE TO PLAN OF CARE: Pt was calm and cooperative.  Pt was alert and oriented and participated fully in assessment.    Pt stated, "I think it's a good idea!" when CSW asked Pt about going to SNF.  Pt happy with this plan.    Pt thanked CSW for time and assistance.   [name], [place] [facility] [phone]

## 2013-04-15 ENCOUNTER — Inpatient Hospital Stay (HOSPITAL_COMMUNITY): Payer: Medicare HMO

## 2013-04-15 DIAGNOSIS — R5381 Other malaise: Secondary | ICD-10-CM

## 2013-04-15 DIAGNOSIS — R5383 Other fatigue: Secondary | ICD-10-CM

## 2013-04-15 DIAGNOSIS — C3430 Malignant neoplasm of lower lobe, unspecified bronchus or lung: Secondary | ICD-10-CM

## 2013-04-15 DIAGNOSIS — D649 Anemia, unspecified: Secondary | ICD-10-CM

## 2013-04-15 DIAGNOSIS — C342 Malignant neoplasm of middle lobe, bronchus or lung: Secondary | ICD-10-CM

## 2013-04-15 DIAGNOSIS — E43 Unspecified severe protein-calorie malnutrition: Secondary | ICD-10-CM | POA: Insufficient documentation

## 2013-04-15 LAB — GLUCOSE, CAPILLARY
GLUCOSE-CAPILLARY: 119 mg/dL — AB (ref 70–99)
GLUCOSE-CAPILLARY: 131 mg/dL — AB (ref 70–99)
GLUCOSE-CAPILLARY: 140 mg/dL — AB (ref 70–99)
Glucose-Capillary: 181 mg/dL — ABNORMAL HIGH (ref 70–99)
Glucose-Capillary: 126 mg/dL — ABNORMAL HIGH (ref 70–99)

## 2013-04-15 LAB — CBC
HCT: 26.9 % — ABNORMAL LOW (ref 39.0–52.0)
Hemoglobin: 9.1 g/dL — ABNORMAL LOW (ref 13.0–17.0)
MCH: 30.3 pg (ref 26.0–34.0)
MCHC: 33.8 g/dL (ref 30.0–36.0)
MCV: 89.7 fL (ref 78.0–100.0)
PLATELETS: 301 10*3/uL (ref 150–400)
RBC: 3 MIL/uL — AB (ref 4.22–5.81)
RDW: 14.6 % (ref 11.5–15.5)
WBC: 9.6 10*3/uL (ref 4.0–10.5)

## 2013-04-15 LAB — BASIC METABOLIC PANEL
BUN: 23 mg/dL (ref 6–23)
CHLORIDE: 104 meq/L (ref 96–112)
CO2: 25 meq/L (ref 19–32)
Calcium: 9.3 mg/dL (ref 8.4–10.5)
Creatinine, Ser: 0.87 mg/dL (ref 0.50–1.35)
GFR calc Af Amer: >90 mL/min (ref >90)
GFR calc non Af Amer: 88 mL/min — ABNORMAL LOW (ref >90)
Glucose, Bld: 99 mg/dL (ref 70–99)
POTASSIUM: 3.9 meq/L (ref 3.7–5.3)
SODIUM: 140 meq/L (ref 137–147)

## 2013-04-15 MED ORDER — IPRATROPIUM-ALBUTEROL 0.5-2.5 (3) MG/3ML IN SOLN
3.0000 mL | Freq: Three times a day (TID) | RESPIRATORY_TRACT | Status: DC
  Administered 2013-04-15 – 2013-04-17 (×7): 3 mL via RESPIRATORY_TRACT
  Filled 2013-04-15 (×8): qty 3

## 2013-04-15 MED ORDER — ENSURE COMPLETE PO LIQD
237.0000 mL | Freq: Three times a day (TID) | ORAL | Status: DC
  Administered 2013-04-15 – 2013-04-17 (×5): 237 mL via ORAL

## 2013-04-15 NOTE — Consult Note (Signed)
[place]  Telephone:([phone]) [place]                                MR#: [id number]  DOB: 1945-05-10                       CSN#: [id number]  Referring MD: Dr. Sheliah [facility]  Primary MD: Dr.  Luiz Iron for Consult: Lung Cancer, progression of sisease  DIAGNOSIS: non-small cell lung cancer, squamous cell carcinoma presenting with synchronous tumor as stage IIIA (T2a., N2, M0) in the right lung and questionably stage IA (T1a., N0, M0) in the left lung based on the imaging studies also the final pathology did not show metastatic squamous cell carcinoma in the mediastinal lymph nodes or the left lower lobe mass   PRIOR THERAPY: Concurrent chemoradiation with weekly carboplatin for AUC of 2 and paclitaxel 45 mg/M2, last cycle, #7, day 1 on 04/30/2012.     PFX:TKWIO L Moss is a 68 y.o. African American  male smoker with a history of synchronous lung cancer presenting as stage IIIa non-small cell lung cancer and the right lung as well as a stage IA at the left lung base who  was undergoing concurrent chemoradiation, last cycle on  last cycle, #7, day 1 on 04/30/2012, last seen at the office on 07/19/12, after which time he lost to follow up, despite attempts to reach him. It appears that he believed that he "was finished with treatment" per patient report . CT scans of the chest performed the end of February 2014 had shown interval decrease size of right lower lobe mass consistent with positive response to therapy.The small hypermetabolic nodule in the superior segment of the left lower lobe had also slightly decreased in size Also, stable to improved small mediastinal lymph nodes with no progression of disease was noted. Continued observation would repeat the scans within 3 months was recommended. However, the patient failed to followup since.  In addition, he was last seen by radiation oncology in March of 2014, and failed to  followup in 6 months time, as it was recommended. Per chart note, if the disease in the right chest were to be into remission, SBRT for his left lung lesion was to be considered.   On 04/13/2013, the patient brought by EMS to the emergency Department with progressive shortness of breath at rest,as well as confusion.he was also found to be hypothermic by EMS with initial rectal temperature of 91.5.cardiac enzymes were negative. CTA on 1/10 with contrast  showed no pulmonary emboli, however marked progression of metastatic lung cancer was noted. The primary mass centered in the superior segment of the right lower lobe shows significant interval enlargement measuring 5.7 x 6.4 cm. Adjacent parenchymal changes show post radiation therapy changes. There may be lymphangitic spread of tumor in the RLL  as well . Innumerable new metastatic nodules are identified throughout all lobes of both lungs. Largest   are a 1.2 cm nodule in the right middle lobe, 1.3 cm nodule at the posterior basilar right lower lobe and 1.3 cm nodule in theLLL. There are too many new metastatic nodules to count. Progressive lymphadenopathy is also identified in the right hilum and mediastinum. Largest right hilar lymph node measures  2.3 x 3.0 cm. Subcarinal lymphadenopathy measures 1.8 cm . There is a trace amount of  pleural fluid on the right. No pericardial fluid is identified. The heart size is normal. No bony lesions are identified.CT of the head with contrast on 04/14/2013 showedNo evidence of acute intracranial abnormality or metastatic disease. He was admitted for further workup and management of symptoms, including bronchodilators and IV Solu-Medrol, N. Now on oral doxycycline for mild COPD exacerbation.Marland Kitchen He is also on IV fluids to improve his dehydration status. As he continues to smoke, concerning for a for tobacco cessation was recommended, and a nicotine patch was placed.he is full code.  We were kindly informed of the patient's  admission, as he may need further oncologic followup and treatment options may need to be discussed if the patient is to be a candidate.   PMH:  Past Medical History  Diagnosis Date  . Hyperlipidemia   . Hypertension   . COPD (chronic obstructive pulmonary disease)   . Stroke     left side weakness, ambulates with cane/ walker  . Right lower lobe lung mass   . Hypoxia   . Nodule of left lung   . Hx of radiation therapy 02/07/12-05/10/12    R central chest hilar,mediastinal region 63 gray, 35 fx  . Lung cancer 01/19/2012    Surgeries:  Past Surgical History  Procedure Laterality Date  . Tonsillectomy      Allergies: No Known Allergies  Medications:  . amLODipine  10 mg Oral Daily  . clopidogrel  75 mg Oral Daily  . doxycycline  100 mg Oral Q12H  . enoxaparin (LOVENOX) injection  40 mg Subcutaneous Q24H  . feeding supplement (ENSURE COMPLETE)  237 mL Oral TID BM  . guaiFENesin  600 mg Oral BID  . insulin aspart  0-9 Units Subcutaneous TID WC  . ipratropium-albuterol  3 mL Nebulization TID  . methylPREDNISolone (SOLU-MEDROL) injection  40 mg Intravenous Q8H  . nicotine  21 mg Transdermal Daily  . sodium chloride  3 mL Intravenous Q12H   BMW:UXLKGMWNUUVOZ, acetaminophen, albuterol, guaiFENesin-dextromethorphan, HYDROcodone-acetaminophen, ondansetron (ZOFRAN) IV, ondansetron, sodium chloride  ROS: Constitutional: Positive for 9 lb  weight loss this month alone, 12 lbs since last visit. Negative for fever, chills or  night sweats.  Eyes: Negative for blurred vision and double vision.  Respiratory: Negative for productive cough. No hemoptysis.No hoarseness.No neck swelling.  Positive for shortness of breath on exertion. No pleuritic chest pain.  Cardiovascular: Negative for chest pain. No palpitations.  GI: Negative for  nausea, vomiting, diarrhea or constipation. No change in bowel caliber. No  Melena or hematochezia. No abdominal pain.  GU: Negative for hematuria. No loss of  urinary control. No urinary retention. Skin: Negative for itching. No rash. No petechia. No easy  Bruising. Musculoskeletal: back pain, chronic , arm pain.  Neurological: chronic, 4 year history of headaches since his stroke.No confusion.chronic dysphagia. No other motor or sensory deficits.  Family History:     Mother, died 47,not aware of cause of death  Father: never knew  Sister, 21: healthy  Brother, died 33s: kidney failure--ultimately died of MI. Was a drug and alcohol abuser.    Social History:  reports that he has been smoking Cigarettes.  He has a 100 pack-year smoking history. He has never used smokeless tobacco. He reports that he does not drink alcohol or use illicit drugs. Disabled since stroke Mar 01, 2010 Widower--wife died 61 years ago. Lives with stepdaughter since home from NH.No children. Physical Exam    Filed Vitals:   04/15/13 0450  BP: 123/76  Pulse: 86  Temp: 98.5 F (36.9 C)  Resp: 20     Filed Weights   04/13/13 2001 05-10-2013 0526 04/15/13 0450  Weight: 104 lb 15 oz (47.6 kg) 104 lb 15 oz (47.6 kg) 104 lb 8 oz (47.4 kg)   General: 48  -year-old [name] in no acute distress A. and O. x3  Well-developed,cachectic, chronically ill appearing HEENT: Normocephalic,temporal wasting, atraumatic, PERRLA. Sclerae anicteric but opaque. Oral cavity without thrush or lesions.poor dentition with several missing teeth. NECK:supple. no thyromegaly, no cervical or supraclavicular adenopathy  LUNGS: decreased breath sounds at the bases with scattered rhonchi and rales No wheezing. No axillary masses.R port negative Radiation changes in right chest.  BREASTS: not examined. CARDIOVASCULAR: regular rate and rhythm, no murmur , rubs or gallops ABDOMEN: soft nontender , bowel sounds x4. No HSM. No masses palpable.  GU/rectal: deferred. EXTREMITIES: no clubbing cyanosis or edema. No bruising or petechial rash MUSCULOSKELETAL: no spinal tenderness.  NEURO: Non Focal except for  residual L hemiparesis and dysphagia secondary to CVA in the past.. No Horner's.   Labs:  CBC   Recent Labs Lab 04/13/13 1233 05/10/13 0750 04/15/13 0619  WBC 12.7* 9.0 9.6  HGB 10.8* 9.3* 9.1*  HCT 31.4* 26.3* 26.9*  PLT 323 333 301  MCV 90.5 88.0 89.7  MCH 31.1 31.1 30.3  MCHC 34.4 35.4 33.8  RDW 14.2 14.3 14.6  LYMPHSABS 0.4*  --   --   MONOABS 0.3  --   --   EOSABS 0.0  --   --   BASOSABS 0.0  --   --      CMP    Recent Labs Lab 04/13/13 1025 May 10, 2013 0750 04/15/13 0619  NA 136* 139 140  K 4.1 3.1* 3.9  CL 94* 103 104  CO2 19 23 25   GLUCOSE 243* 117* 99  BUN 18 20 23   CREATININE 1.13 0.90 0.87  CALCIUM 10.5 9.0 9.3  MG  --  1.8  --   AST 13  --   --   ALT 6  --   --   ALKPHOS 72  --   --   BILITOT 0.4  --   --         Component Value Date/Time   BILITOT 0.4 04/13/2013 1025   BILITOT 0.63 09/18/2012 1040   BILIDIR 0.2 02/06/2012 0750   IBILI 0.6 02/06/2012 0750     No results found for this basename: INR, PROTIME,  in the last 168 hours  No results found for this basename: DDIMER,  in the last 72 hours   Anemia panel:  No results found for this basename: VITAMINB12, FOLATE, FERRITIN, TIBC, IRON, RETICCTPCT,  in the last 72 hours   Imaging Studies:  Ct Head W Wo Contrast  05/10/2013   CLINICAL DATA:  68 year old male with left hemiparesis and headache. History of lung cancer. Marland Kitchen  EXAM: CT HEAD WITHOUT AND WITH CONTRAST  TECHNIQUE: Contiguous axial images were obtained from the base of the skull through the vertex without and with intravenous contrast  CONTRAST:  161mL OMNIPAQUE IOHEXOL 300 MG/ML  SOLN  COMPARISON:  01/09/2012 MRI and 02/08/2010 CT  FINDINGS: Remote right basal ganglia and right frontoparietal infarcts again noted.  Mild chronic small-vessel white matter ischemic changes are again identified.  No acute intracranial abnormalities are identified, including abnormal enhancement, mass lesion or mass effect, hydrocephalus, extra-axial  fluid collection, midline shift, hemorrhage, or acute infarction. The visualized bony calvarium is unremarkable.  IMPRESSION: No evidence of acute  intracranial abnormality or metastatic disease.  Remote right side infarcts and chronic small-vessel white matter ischemic changes.   Electronically Signed   By: Hassan Rowan M.D.   On: 04/14/2013 13:52   Ct Angio Chest Pe W/cm &/or Wo Cm  04/13/2013   CLINICAL DATA:  Shortness of breath and history of lung carcinoma.  EXAM: CT ANGIOGRAPHY CHEST WITH CONTRAST  TECHNIQUE: Multidetector CT imaging of the chest was performed using the standard protocol during bolus administration of intravenous contrast. Multiplanar CT image reconstructions including MIPs were obtained to evaluate the vascular anatomy.  CONTRAST:  110mL OMNIPAQUE IOHEXOL 350 MG/ML SOLN  COMPARISON:  Multiple prior CT exams of the chest. The latest is dated 09/18/2012.  FINDINGS: The pulmonary arteries are well opacified at the time of the examination. There is no evidence of pulmonary embolism.  There is significant progression of lung carcinoma and metastatic disease in the chest. The primary mass centered in the superior segment of the right lower lobe shows significant interval enlargement with maximal transverse dimensions of approximately 5.7 x 6.4 cm. Adjacent parenchymal changes likely reflect post radiation therapy changes. There may be lymphangitic spread of tumor in the right lower lobe, as well, which is strongly suspected given the multitude of other metastatic lesions identified.  Innumerable new metastatic nodules are identified throughout all lobes of both lungs. Largest measured nodules are a 1.2 cm nodule in the right middle lobe, 1.3 cm nodule at the posterior basilar right lower lobe and 1.3 cm nodule in the left lower lobe. There are too many new metastatic nodules to count.  Progressive lymphadenopathy is also identified in the right hilum and mediastinum. Largest right hilar lymph node  measures approximately 2.3 x 3.0 cm. Subcarinal lymphadenopathy measures 1.8 cm in short axis.  There is a trace amount of pleural fluid on the right. No pericardial fluid is identified. The heart size is normal. No bony lesions are identified.  Review of the MIP images confirms the above findings.  IMPRESSION: Marked progression of metastatic lung carcinoma in the chest with significant enlargement of the primary right lower lobe mass as well as innumerable new metastatic nodules throughout both lungs. There may also be lymphangitic spread of tumor in the right lower lobe.   Electronically Signed   By: Aletta Edouard M.D.   On: 04/13/2013 13:05   Dg Chest Port 1 View  04/13/2013   CLINICAL DATA:  Shortness of breath.  Lung cancer.  EXAM: PORTABLE CHEST - 1 VIEW  COMPARISON:  Chest CT 09/18/2012.  FINDINGS: The a power port is in good position. The cardiac silhouette, mediastinal and hilar contours are stable. There is a large right hilar mass and right hilar adenopathy. Underlying advanced emphysematous changes with increased interstitial markings in both lungs, right greater than left which could reflect asymmetric edema. Could not exclude the possibly of interstitial spread of tumor on the right. Suspect small pulmonary nodules No definite pleural effusion. The bony thorax is grossly intact.  IMPRESSION: Enlarging right hilar mass and hilar adenopathy.  Increased interstitial markings in both lungs could reflect asymmetric noncardiogenic pulmonary edema. Could not exclude the possibility of interstitial spread of tumor in the right lung.  Underlying emphysematous changes.  Suspect pulmonary metastasis.   Electronically Signed   By: Kalman Jewels M.D.   On: 04/13/2013 11:07   Ct Chest W Contrast  06/01/2012 *RADIOLOGY REPORT* Clinical Data: Follow-up lung cancer. Chemotherapy and radiation therapy completed 1 month ago. Shortness of breath. CT CHEST  WITH CONTRAST Technique: Multidetector CT imaging of the  chest was performed following the standard protocol during bolus administration of intravenous contrast. Contrast: 60mL OMNIPAQUE IOHEXOL 300 MG/ML SOLN Comparison: PET CT 01/09/2012. Chest CT 01/06/2012. Findings: The dominant spiculated hypermetabolic mass in the superior segment of the right lower lobe has decreased in size, now measuring 2.6 x 2.6 cm (previously 3.3 x 3.5 cm). This abuts the superior aspect of the major fissure. The small hypermetabolic nodule in the superior segment of the left lower lobe demonstrated on PET CT appears slightly smaller, measuring 6 x 3 mm on image 24. There is some tenting of the major fissure. No new or enlarging pulmonary nodules are identified. There are diffuse emphysematous changes throughout both lungs. Port-A-Cath tip is at the SVC right atrial junction. There are no enlarged mediastinal or hilar lymph nodes. There is an 8 mm precarinal node on image 26 which was hypermetabolic on PET CT and appears stable to slightly decreased in size. Prominent asymmetric nodularity within the left axilla is unchanged and appears tubular on the reformatted images, favored to represent venous structures. Coronary artery calcifications are noted. There is no pleural or pericardial effusion. The visualized upper abdomen appears unchanged without suspicious findings. There are no worrisome osseous findings. IMPRESSION: 1. Interval decrease size of right lower lobe mass consistent with positive response to therapy. 2. The small hypermetabolic nodule in the superior segment of the left lower lobe has also slightly decreased in size. 3. Stable to improved small mediastinal lymph nodes. No progressive disease identified. Original Report Authenticated By: Richardean Sale, M.D.     A/P: 68 y.o. male  with a history of synchronous lung cancer presenting as stage IIIa non-small cell lung cancer and the right lung as well as a stage IA at the left lung base who  was undergoing concurrent  chemoradiation with weekly carboplatin for AUC of 2 and paclitaxel 45 mg/M2, last cycle, #7, day 1 on 04/30/2012.Last seen at the office on 07/19/12, after which time he lost to follow up.Repeat CT scan of the chest performed the end of February 2014  showed interval decrease size of right lower lobe mass consistent with positive response to therapy. Continued observation with repeat  scans within 3 months was recommended. However, the patient failed to followup since.  In addition, he was last seen by radiation oncology in March of 2014, and failed to followup in 6 months time, as it was recommended. Per chart note, if the disease in the right chest were to be into remission, SBRT for his left lung lesion was to be considered.  He was admitted with increasing shortness of breath, hypothermia, and acute mental status changes. A new CT angiogram on 04/13/2013, showed Marked progression of metastatic lung carcinoma in the chest with significant enlargement of the primary right lower lobe mass as well as innumerable new metastatic nodules throughout both lungs. There may also be lymphangitic spread of tumor in the right lower lobe.CT of the head with contrast on 04/14/2013 showedNo evidence of acute intracranial abnormality or metastatic disease..no other CTs were performed for staging purposes. We were kindly informed of the patient's admission, as he is to need oncological followup.He continues to smoke,for which nicotine patch was placed, and tobacco cessation counseling was requested.He is full code Consider nutritional evaluation while in hospital. Dr. Julien Nordmann    is to see the patient following this consult with recommendations regarding diagnosis, treatment options and further workup studies. An addendum to this note is  to be written.  Thank you for the referral.  Rondel Jumbo, PA-C 04/15/2013 9:02 AM  Addendum: Hematology/Oncology Attending: This is a very pleasant 68 years old African American male  with synchronous stage IIIA as well as a stage IA non-small cell lung cancer involving the right and left lung. He is status post a course of concurrent chemoradiation with weekly carboplatin and paclitaxel completed in January of 2014 and followup scan on 10 June of 2014 showed no evidence for disease progression. The patient was supposed to followup as scheduled in 3 months from his last visit in April 2014. He missed his appointment and we were unable to reach him. He thought that he is done with his treatment and he didn't need to see medical oncology any more. He was admitted to [facility] on [facility]11[facility]2014 with shortness of breath and hypothermia. CT angiogram of the chest performed on the day of his admission showed marked progression of metastatic lung carcinoma in the chest was significant enlargement of the primary right lower lobe mass as well as innumerable new metastatic nodules throughout both lungs. There may be also lymphangitic spread of tumor in the right lower lobe. CT scan of the head showed no evidence for metastatic disease in the brain. The patient is feeling a little bit better today but has significant weakness and fatigue. He is expected to be discharged to the skilled nursing facility for rehabilitation. I have a lengthy discussion with the patient today about his current condition and treatment options. I gave him the option of palliative care versus palliative systemic chemotherapy. The patient understands that he has incurable condition. He is interested in considering treatment with chemotherapy if needed. I would consider the patient for treatment with either single agent gemcitabine or Abraxane. I will arrange for him a followup appointment with me at the [facility] within 1-2 weeks after his discharge. Thank you so much for taking good care of Benjamin Moss, I will continue to follow up the patient with you and assist in his management an as-needed basis.

## 2013-04-15 NOTE — Progress Notes (Addendum)
[facility] PROGRESS NOTE    Benjamin Moss WGY:[id number] DOB: 01/04/1946 DOA: 04/13/2013 PCP: No primary provider on file.  HPI/Brief narrative 68 y.o. 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 CT chest showed marked progression of metastatic lung cancer.   Assessment/Plan:  Acute hypoxic respiratory failure  - Secondary to metastatic lung cancer and COPD. No PE on CT chest.  - Continue bronchodilator nebulizations and IV Solu-Medrol.  - Patient on continuous pulse oximetry and seems to intermittently desaturated in the mid 80s. Continue current management. Probably has been chronically hypoxic.  Hypothermia  - Likely secondary to cold exposure and less likely due to sepsis. Patient is status post single dose of IV vancomycin and Zosyn. CT chest not suggestive of infection. Urine microscopy negative.  - Improved after warming blanket-continue when necessary. Followup blood culture reports.  TSH normal. Hold antibiotics for now.  - Resolved   Acute encephalopathy  -  Resolved  - Mental status changes could be multifactorial secondary to hypothermia & hypoxemia. No obvious focal deficits.  CT head with contrast negative for acute findings or metastasis.  Dehydration  - Resolved after IV fluids. DC IV fluids.  Metastatic small small cell lung cancer  - It appears as though patient has not kept her followup appointment.  - Discussed with Dr. Julien Nordmann who will see today and make further recommendations.  Mild COPD exacerbation  - Management as above and will add oral doxycycline.  -  improving   Hyperglycemia/Type II DM-new diagnosis  -  hemoglobin A1c: 6.7. SSI.   Tobacco abuse  - Cessation counseling and patient oriented.  Nicotine patch.   Hypertension  - Controlled   Anemia  - Likely from chronic disease. Stable.   History of CVA  - Continue Plavix. Dysphagia 3 diet until swallow evaluation by speech therapy.  Hypokalemia  - Replaced -  magnesium normal  Severe malnutrition in the context of chronic disease - Management as per dietitian consult   Code Status:  Full Family Communication:  discussed on 1/11 with the patient's neighbor/friend Mr. Timmothy Sours, at bedside-only responsible person for patient  Disposition Plan: ? Placement when medically stable. Per Mr. Mitzie Na, no 24/7 supervision    Consultants:  Medical oncology  Speech therapy  Procedures:  None   Antibiotics:  Oral doxycycline 1/10 >   Subjective: Headache. Denies dyspnea.  Objective: Filed Vitals:   04/14/13 1954 04/14/13 2130 04/15/13 0450 04/15/13 0807  BP:  123/72 123/76   Pulse: 84 68 86   Temp:  98.7 F (37.1 C) 98.5 F (36.9 C)   TempSrc:  Oral Oral   Resp: 18 20 20    Height:      Weight:   47.4 kg (104 lb 8 oz)   SpO2: 93% 96% 98% 97%    Intake/Output Summary (Last 24 hours) at 04/15/13 1123 Last data filed at 04/15/13 0600  Gross per 24 hour  Intake    960 ml  Output    750 ml  Net    210 ml   Filed Weights   04/13/13 2001 04/14/13 0526 04/15/13 0450  Weight: 47.6 kg (104 lb 15 oz) 47.6 kg (104 lb 15 oz) 47.4 kg (104 lb 8 oz)     Exam:  General exam: Small built thin cachectic, chronically ill looking  male, sitting up in bed and comfortable. Respiratory system: Reduced breath sounds bilaterally. No wheezing or rhonchi. Few basal crackles. No increased work of breathing.  Cardiovascular system: S1 & S2 heard, RRR. No JVD, murmurs, gallops, clicks or pedal edema.  Gastrointestinal system: Abdomen is nondistended, soft and nontender. Normal bowel sounds heard. Central nervous system: Alert and oriented. No focal neurological deficits. Extremities: Right upper extremity grade  5/5 power, bilateral lower extremities grade 4+/5 power. Left upper extremity grade 3/5 power- from prior stroke   Data Reviewed: Basic Metabolic Panel:  Recent Labs Lab 04/13/13 1025 04/14/13 0750 04/15/13 0619  NA 136* 139 140  K 4.1 3.1* 3.9  CL 94* 103 104  CO2 19 23 25   GLUCOSE 243* 117* 99  BUN 18 20 23   CREATININE 1.13 0.90 0.87  CALCIUM 10.5 9.0 9.3  MG  --  1.8  --    Liver Function Tests:  Recent Labs Lab 04/13/13 1025  AST 13  ALT 6  ALKPHOS 72  BILITOT 0.4  PROT 7.3  ALBUMIN 3.0*    Recent Labs Lab 04/13/13 1025  LIPASE 18   No results found for this basename: AMMONIA,  in the last 168 hours CBC:  Recent Labs Lab 04/13/13 1233 04/14/13 0750 04/15/13 0619  WBC 12.7* 9.0 9.6  NEUTROABS 12.0*  --   --   HGB 10.8* 9.3* 9.1*  HCT 31.4* 26.3* 26.9*  MCV 90.5 88.0 89.7  PLT 323 333 301   Cardiac Enzymes:  Recent Labs Lab 04/13/13 1025  TROPONINI <0.30   BNP (last 3 results) No results found for this basename: PROBNP,  in the last 8760 hours CBG:  Recent Labs Lab 04/14/13 0741 04/14/13 1235 04/14/13 1715 04/14/13 2131 04/15/13 0753  GLUCAP 99 178* 110* 192* 119*    Recent Results (from the past 240 hour(s))  CULTURE, BLOOD (ROUTINE X 2)     Status: None   Collection Time    04/13/13 10:25 AM      Result Value Range Status   Specimen Description BLOOD RIGHT CHEST  5 ML IN [facility] BOTTLE   Final   Special Requests NONE   Final   Culture  Setup Time     Final   Value: 04/13/2013 17:49     Performed at [facility]   Culture     Final   Value:        BLOOD CULTURE RECEIVED NO GROWTH TO DATE CULTURE WILL BE HELD FOR 5 DAYS BEFORE ISSUING A FINAL NEGATIVE REPORT     Performed at [facility]   Report Status PENDING   Incomplete  CULTURE, BLOOD (ROUTINE X 2)     Status: None   Collection Time    04/13/13 12:39 PM      Result Value Range Status   Specimen Description BLOOD RIGHT FOREARM  4 ML IN [facility] BOTTLE   Final    Special Requests NONE   Final   Culture  Setup Time     Final   Value: 04/13/2013 17:48     Performed at [facility]   Culture     Final   Value:        BLOOD CULTURE RECEIVED NO GROWTH TO DATE CULTURE WILL BE HELD FOR 5 DAYS BEFORE ISSUING A FINAL NEGATIVE REPORT     Performed at [facility]   Report Status PENDING   Incomplete      Additional labs: 1. TSH: 1.646  2.  Hemoglobin A1c: 6.7      Studies: Ct Head W Wo Contrast  04/14/2013   CLINICAL DATA:  68 year old male with left hemiparesis and headache. History of lung cancer. Marland Kitchen  EXAM: CT HEAD WITHOUT AND WITH CONTRAST  TECHNIQUE: Contiguous axial images were obtained from the base of the skull through the vertex without and with intravenous contrast  CONTRAST:  146mL OMNIPAQUE IOHEXOL 300 MG/ML  SOLN  COMPARISON:  01/09/2012 MRI and 02/08/2010 CT  FINDINGS: Remote right basal ganglia and right frontoparietal infarcts again noted.  Mild chronic small-vessel white matter ischemic changes are again identified.  No acute intracranial abnormalities are identified, including abnormal enhancement, mass lesion or mass effect, hydrocephalus, extra-axial fluid collection, midline shift, hemorrhage, or acute infarction. The visualized bony calvarium is unremarkable.  IMPRESSION: No evidence of acute intracranial abnormality or metastatic disease.  Remote right side infarcts and chronic small-vessel white matter ischemic changes.   Electronically Signed   By: Hassan Rowan M.D.   On: 04/14/2013 13:52   Ct Angio Chest Pe W/cm &/or Wo Cm  04/13/2013   CLINICAL DATA:  Shortness of breath and history of lung carcinoma.  EXAM: CT ANGIOGRAPHY CHEST WITH CONTRAST  TECHNIQUE: Multidetector CT imaging of the chest was performed using the standard protocol during bolus administration of intravenous contrast. Multiplanar CT image reconstructions including MIPs were obtained to evaluate the vascular anatomy.  CONTRAST:  176mL OMNIPAQUE IOHEXOL 350  MG/ML SOLN  COMPARISON:  Multiple prior CT exams of the chest. The latest is dated 09/18/2012.  FINDINGS: The pulmonary arteries are well opacified at the time of the examination. There is no evidence of pulmonary embolism.  There is significant progression of lung carcinoma and metastatic disease in the chest. The primary mass centered in the superior segment of the right lower lobe shows significant interval enlargement with maximal transverse dimensions of approximately 5.7 x 6.4 cm. Adjacent parenchymal changes likely reflect post radiation therapy changes. There may be lymphangitic spread of tumor in the right lower lobe, as well, which is strongly suspected given the multitude of other metastatic lesions identified.  Innumerable new metastatic nodules are identified throughout all lobes of both lungs. Largest measured nodules are a 1.2 cm nodule in the right middle lobe, 1.3 cm nodule at the posterior basilar right lower lobe and 1.3 cm nodule in the left lower lobe. There are too many new metastatic nodules to count.  Progressive lymphadenopathy is also identified in the right hilum and mediastinum. Largest right hilar lymph node measures approximately 2.3 x 3.0 cm. Subcarinal lymphadenopathy measures 1.8 cm in short axis.  There is a trace amount of pleural fluid on the right. No pericardial fluid is identified. The heart size is normal. No bony lesions are identified.  Review of the MIP images confirms the above findings.  IMPRESSION: Marked progression of metastatic lung carcinoma in the chest with significant enlargement of the primary right lower lobe mass as well as innumerable new metastatic nodules throughout both lungs. There may also be lymphangitic spread of tumor in the right lower lobe.   Electronically Signed   By: Aletta Edouard M.D.   On: 04/13/2013 13:05        Scheduled Meds: . amLODipine  10 mg Oral Daily  . clopidogrel  75 mg Oral Daily  . doxycycline  100 mg Oral Q12H  .  enoxaparin (LOVENOX) injection  40 mg Subcutaneous Q24H  . feeding supplement (ENSURE COMPLETE)  237 mL Oral TID BM  . guaiFENesin  600 mg Oral BID  . insulin aspart  0-9 Units Subcutaneous TID WC  . ipratropium-albuterol  3 mL Nebulization TID  . methylPREDNISolone (SOLU-MEDROL) injection  40 mg Intravenous Q8H  . nicotine  21 mg Transdermal Daily  . sodium chloride  3 mL Intravenous Q12H   Continuous Infusions:   Principal Problem:   Acute respiratory failure with hypoxia Active Problems:   COPD   Hyperlipidemia   Hypertension   Lung cancer   Leukocytosis   Hypothermia   Acute respiratory failure   Anemia   Acute encephalopathy   Dehydration   Metastatic primary lung cancer   Hypokalemia   Protein-calorie malnutrition, severe    Time spent:  25 minutes     Ghada Abbett, MD, FACP, FHM. [facility] Pager [phone]  If 7PM-7AM, please contact night-coverage [email or web address] Password [email or web address] 04/15/2013, 11:23 AM    LOS: 2 days

## 2013-04-15 NOTE — Evaluation (Signed)
Clinical/Bedside Swallow Evaluation Patient Details  Name: Benjamin Moss MRN: [id number] Date of Birth: 01/14/1946  Today's Date: 04/15/2013 Time: 2778-2423 SLP Time Calculation (min): 30 min  Past Medical History:  Past Medical History  Diagnosis Date  . Hyperlipidemia   . Hypertension   . COPD (chronic obstructive pulmonary disease)   . Stroke     left side weakness, ambulates with cane/ walker  . Right lower lobe lung mass   . Hypoxia   . Nodule of left lung   . Hx of radiation therapy 02/07/12-05/10/12    R central chest hilar,mediastinal region 63 gray, 35 fx  . Lung cancer 01/19/2012   Past Surgical History:  Past Surgical History  Procedure Laterality Date  . 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  PMH + for cva with dysphagia requiring modified diet (dys3/honey per Nov 2011 MBS), pt states he stopped using thickener when he "ran out of money".  CT head recently showed remote right sided infarcts.  Pt admits to occasionally becoming "choked" on food more than drink.   Note per HX, pt with radiation tx that may contribute to esophageal dysphagia.    Assessment / Plan / Recommendation Clinical Impression  Pt presents with symptoms of pharyngeal, probable esophageal dysphagia- with throat clearing, cough and belch noted with all liquids.  Pt appeared to tolerate applesauce and cracker better than liquids.  Given h/o dysphagia from CVA in 2011, admission with pulmonary issue and pt complaint of dysphagia, MBS warranted.  Pt states he will consume thickened liquids if indicated per MBS per SLP direct ? cue, as it is evident he dislikes them.  SLP goal to mitigate dysphagia,aspiration risk reviewed with pt, suspect resolution of dysphagia not likely.  Skilled intervention included educating pt to previous test and mitigation strategies.      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  PMH + for cva with dysphagia requiring modified diet (dys3/honey per Nov 2011 MBS), pt states he stopped using thickener when he "ran out of money".  CT head recently showed remote right sided infarcts.  Pt admits to occasionally becoming "choked" on food more than drink.   Type of Study: Bedside swallow evaluation Diet Prior to this Study: Dysphagia 3 (soft);Thin liquids Temperature Spikes Noted: No Respiratory Status: Nasal cannula History of Recent Intubation: No Behavior/Cognition: Alert;Cooperative;Pleasant mood (delayed responses) Oral Cavity - Dentition: Poor condition (lower no dentition) Self-Feeding Abilities: Able to feed self Patient Positioning: Upright in bed Baseline Vocal Quality: Clear Volitional Cough: Weak (nonproductive) Volitional Swallow: Able to elicit    Oral/Motor/Sensory Function Overall Oral Motor/Sensory Function:  (left facial, labial, lingual sensorimotor deficit from previous cva)   Ice Chips Ice chips: Not tested   Thin Liquid Thin Liquid: Impaired Presentation: Cup;Self Fed;Straw;Spoon Oral Phase Impairments: Impaired anterior to  posterior transit;Reduced lingual movement/coordination Oral Phase Functional Implications: Prolonged oral transit Pharyngeal  Phase Impairments: Suspected delayed Swallow;Other (comments);Throat Clearing - Immediate;Cough - Delayed (belching noted)    Nectar Thick Nectar Thick Liquid: Impaired Presentation: Spoon;Cup;Self Fed Oral Phase Impairments: Impaired anterior to  posterior transit Oral phase functional implications: Prolonged oral transit Pharyngeal Phase Impairments: Throat Clearing - Immediate   Honey Thick Honey Thick Liquid: Not tested   Puree Puree: Within functional limits Presentation: Self Fed;Spoon   Solid   GO    Solid: Within functional limits Presentation: [place], Warm [facility] SLP [phone]

## 2013-04-15 NOTE — Care Management Note (Addendum)
    Page 1 of 1   04/17/2013     12:39:29 PM   CARE MANAGEMENT NOTE 04/17/2013  Patient:  FINDLAY, DAGHER   Account Number:  [id number]  Date Initiated:  04/15/2013  Documentation initiated by:  Dessa Phi  Subjective/Objective Assessment:   68 Y/O M ADMITTED W/SOB.NOTED WAS PICKED UP BY EMS FOR MILD CONFUSION-NO HEAT IN HOME,HYPOTHERMIA.RESPIRATORY FAILURE.AU:[id number] LUNG CA.     Action/Plan:   FROM HOME.NOTED NO CLOSE FAMILY.   Anticipated DC Date:  04/17/2013   Anticipated DC Plan:  [place]  CM consult      Choice offered to / List presented to:             Status of service:  Completed, signed off Medicare Important Message given?   (If response is "NO", the following Medicare IM given date fields will be blank) Date Medicare IM given:   Date Additional Medicare IM given:    Discharge Disposition:  [place]  Per UR Regulation:  Reviewed for med. necessity/level of care/duration of stay  If discussed at [place] of Stay Meetings, dates discussed:    Comments:  04/17/13 Graceann Boileau RN,BSN NCM [phone] D/C SNF.  04/15/13 Breia Ocampo RN,BSN NCM [phone] PT-SNF.ST-DYSPHAGIE 3(MECH SOFT)THIN LIQ.RD-SEVERE MALNUTRITION.ONCOLOGY FOLLOWING.D/C PLAN SNF.

## 2013-04-15 NOTE — Progress Notes (Signed)
CSW met with patient. Patient is alert and oriented X3. CSW provided bed offers. Patient became very tearful and choked up while CSW was in the room. CSW tried to provide emotional support and ask patient what was wrong but patient would not discuss with CSW. Patient attempted to smile and thanked CSW. He will discuss with his family.  Bunyan Brier C. Edwards MSW, [place]

## 2013-04-15 NOTE — Progress Notes (Signed)
INITIAL NUTRITION ASSESSMENT  DOCUMENTATION CODES Per approved criteria  -Severe malnutrition in the context of chronic illness -Underweight  Pt meets criteria for Severe MALNUTRITION in the context of chronic disease as evidenced by 28% body weight loss in 6 months and <75% est nutrition needs intake for > one month.   INTERVENTION: - Recommend Ensure Complete po TID, each supplement provides 350 kcal and 13 grams of protein - Encouraged frequent snacking and meals for weight gain - Provided examples of ways to increase protein intake in diet  NUTRITION DIAGNOSIS: Increased nutrient needs (protein/kcal) related to increased demand for nutrient as evidenced by BMI < 18.5, wt loss.   Goal: Pt to meet >/= 90% of their estimated nutrition needs   Monitor:  Total protein/energy intake, labs, weights, high protein/kcal diet adherence  Reason for Assessment: Consult to Assess/BMI < 18.5  68 y.o. male  Admitting Dx: Acute respiratory failure with hypoxia  ASSESSMENT: Benjamin Moss is a 68 y.o. 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 Patient is unable to provide clear history secondary to his confusion. He states that it was very cold and hence his friend called 911 to bring him here. However per EDP report, patient did not have heat in his house and only has a stand alone he doesn't his bedroom. He had gone to the drug store to buy cigarettes and was picked up by EMS for mild confusion and found to be hypothermic. Patient does complain of cough with intermittent white sputum, chest pain on coughing and intermittent dyspnea. He denies fever, chills, headache, earache, sore throat, nausea, vomiting, abdominal pain, diarrhea, constipation, urinary frequency or dysuria. He states that he eats regular  consistency food. In the ED, initial rectal temperature 91.5 which normalized to 98.4 with warming blanket, hypoxic, glucose 243, albumin 3, WBC 12.7, hemoglobin 10.8, negative troponin and CTA chest shows no PE but shows marked progression of metastatic lung cancer.   - Pt reported unintentional wt loss of approximately 40 lbs in past 6 months - Denied any changes in appetite. Diet recall indicated large amounts of high carbohydrates foods ("oodles of noodles and cornflakes") with minimal protein.  - Pt was willing to incorporate easy to prepare proteins into meals (eggs, tuna, chicken salad, peanut butter) to assist in weight maintenance - Pt enjoyed sample of Ensure Complete, encouraged pt to consume supplement upon d/c - Has had periods of dry mouth, this however has not affected appetite/PO intake - Denied dysphagia or difficulty tolerating diet texture - Consumed 100% of breakfast during admit - Admitted with dehydration per MD  Height: Ht Readings from Last 1 Encounters:  04/13/13 5\' 9"  (1.753 m)    Weight: Wt Readings from Last 1 Encounters:  04/15/13 104 lb 8 oz (47.4 kg)    Ideal Body Weight: 160 lbs  % Ideal Body Weight: 65%  Wt Readings from Last 10 Encounters:  04/15/13 104 lb 8 oz (47.4 kg)  07/19/12 116 lb 4.8 oz (52.753 kg)  06/07/12 116 lb 1.6 oz (52.663 kg)  05/10/12 118 lb 9.6 oz (53.797 kg)  05/08/12 116 lb 8 oz (52.844 kg)  05/01/12 120 lb 1.6 oz (54.477 kg)  04/30/12 115 lb 9.6 oz (52.436 kg)  04/24/12 119 lb 12.8 oz (54.341 kg)  04/20/12 113 lb 6.4 oz (51.438 kg)  04/17/12 118 lb 12.8 oz (53.887 kg)  Usual Body Weight: 143 lbs  % Usual Body Weight: 73%  BMI:  Body mass index is 15.42 kg/(m^2). Underweight  Estimated Nutritional Needs: Kcal: 1700-1900  Protein: 60-70 gram Fluid: 1700 ml/daily  Skin: WDL  Diet Order: Dysphagia 3  EDUCATION NEEDS: -Education needs addressed   Intake/Output Summary (Last 24 hours) at 04/15/13 1101 Last  data filed at 04/15/13 0600  Gross per 24 hour  Intake    960 ml  Output    750 ml  Net    210 ml    Last BM: 1/09   Labs:   Recent Labs Lab 04/13/13 1025 04/14/13 0750 04/15/13 0619  NA 136* 139 140  K 4.1 3.1* 3.9  CL 94* 103 104  CO2 19 23 25   BUN 18 20 23   CREATININE 1.13 0.90 0.87  CALCIUM 10.5 9.0 9.3  MG  --  1.8  --   GLUCOSE 243* 117* 99    CBG (last 3)   Recent Labs  04/14/13 1715 04/14/13 2131 04/15/13 0753  GLUCAP 110* 192* 119*    Scheduled Meds: . amLODipine  10 mg Oral Daily  . clopidogrel  75 mg Oral Daily  . doxycycline  100 mg Oral Q12H  . enoxaparin (LOVENOX) injection  40 mg Subcutaneous Q24H  . guaiFENesin  600 mg Oral BID  . insulin aspart  0-9 Units Subcutaneous TID WC  . ipratropium-albuterol  3 mL Nebulization TID  . methylPREDNISolone (SOLU-MEDROL) injection  40 mg Intravenous Q8H  . nicotine  21 mg Transdermal Daily  . sodium chloride  3 mL Intravenous Q12H    Continuous Infusions:   Past Medical History  Diagnosis Date  . Hyperlipidemia   . Hypertension   . COPD (chronic obstructive pulmonary disease)   . Stroke     left side weakness, ambulates with cane/ walker  . Right lower lobe lung mass   . Hypoxia   . Nodule of left lung   . Hx of radiation therapy 02/07/12-05/10/12    R central chest hilar,mediastinal region 63 gray, 35 fx  . Lung cancer 01/19/2012    Past Surgical History  Procedure Laterality Date  . Tonsillectomy      [place] Clinical Dietitian [phone]

## 2013-04-15 NOTE — Procedures (Signed)
Objective Swallowing Evaluation: Modified Barium Swallowing Study  Patient Details  Name: Benjamin Moss MRN: [id number] Date of Birth: [birth year]  Today's Date: 04/15/2013 Time: 1240-1315 SLP Time Calculation (min): 35 min  Past Medical History:  Past Medical History  Diagnosis Date  . Hyperlipidemia   . Hypertension   . COPD (chronic obstructive pulmonary disease)   . Stroke     left side weakness, ambulates with cane/ walker  . Right lower lobe lung mass   . Hypoxia   . Nodule of left lung   . Hx of radiation therapy 02/07/12-05/10/12    R central chest hilar,mediastinal region 63 gray, 35 fx  . Lung cancer 01/19/2012   Past Surgical History:  Past Surgical History  Procedure Laterality Date  . 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  PMH + for cva with dysphagia requiring modified diet (dys3/honey per Nov 2011 MBS), pt states he stopped using thickener when he "ran out of money".  CT head recently showed remote right sided infarcts.  Pt admits to occasionally becoming "choked" on food more than drink.       Assessment / Plan / Recommendation Clinical Impression  Dysphagia Diagnosis: Moderate oral phase dysphagia;Moderate pharyngeal phase dysphagia;Moderate cervical esophageal phase dysphagia   Clinical impression: Moderate oropharyngeal and cervical esophageal dysphagia with sensorimotor deficits- likely from CVA with exacerbation from acute illness.  Delayed oral transiting, decreased oral propulsion due to weakness noted.  Pharyngeal swallow characterized by delayed swallow initiation (up to 4 seconds at pyriform), decreased laryngeal elevation/closure contributing to stasis/laryngeal penetration.  Pt independently conducted extended breath hold with MULTIPLE dry swallows aiding pharyngeal clearance.  This maneuever is tiring for pt but effective.  Cued throat clear/cough removed majority of  laryngeal penetrates/aspirates.  Chin tuck, head turn postures not helpful to decrease stasis.   Mild silent aspiration of thin was cleared with cued cough- aspiration noted with thin conducting chin tuck.     Please note, pt belched during testing with appearance of backflow to proximal esophagus - suspect esoph component to his dysphagia.  Also pt noted to cough during testing, which was not always indicative of airway infiltration of barium.  Skilled intervention included educating pt to findings, reinforcement of effective compensation strategies.  Recommend continue current diet (soft/thin) with strict precautions.  His asp risk did not significantly decrease with thick vs thin liquid, therefore for QOL rec thin.  Also recommend pt follow esophageal precautions due to his report of "vomiting" at home during each meal.       Treatment Recommendation  Therapy as outlined in treatment plan below    Diet Recommendation Dysphagia 3 (Mechanical Soft);Thin liquid   Liquid Administration via: Cup Medication Administration: Whole meds with puree (follow with liquid) Supervision: Patient able to self feed Compensations: Slow rate;Small sips/bites;Multiple dry swallows after each bite/sip;Follow solids with liquid;Hard cough after swallow Postural Changes and/or Swallow Maneuvers: Seated upright 90 degrees;Upright 30-60 min after meal    Other  Recommendations Recommended Consults: MBS Oral Care Recommendations: Oral care BID   Follow Up Recommendations  Skilled Nursing facility    Frequency and Duration min 2x/week  2 weeks   Pertinent Vitals/Pain Afebrile, decreased   SLP Swallow Goals     General Date of Onset: 04/15/13 HPI: 68 yo male adm to [facility] with dyspnea at rest, found to have 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  PMH + for cva with dysphagia requiring modified diet (dys3/honey per Nov 2011 MBS), pt states he stopped using thickener when he "ran out of money".  CT head  recently showed remote right sided infarcts.  Pt admits to occasionally becoming "choked" on food more than drink.   Type of Study: Modified Barium Swallowing Study Reason for Referral: Objectively evaluate swallowing function Diet Prior to this Study: Dysphagia 3 (soft);Thin liquids Temperature Spikes Noted: No Respiratory Status: Nasal cannula History of Recent Intubation: No Behavior/Cognition: Alert;Cooperative;Pleasant mood (delayed responses) Oral Cavity - Dentition: Poor condition (lower no dentition) Oral Motor / Sensory Function: Impaired - see Bedside swallow eval Self-Feeding Abilities: Able to feed self Patient Positioning: Upright in chair Baseline Vocal Quality: Clear Volitional Cough: Weak (nonproductive) Volitional Swallow: Able to elicit Anatomy:  (appearance of cervical osteophytes C4-C5, C5-C6) Pharyngeal Secretions: Standing secretions in (comment) (pharynx)    Reason for Referral Objectively evaluate swallowing function   Oral Phase Oral Preparation/Oral Phase Oral Phase: Impaired Oral - Nectar Oral - Nectar Teaspoon: Delayed oral transit;Reduced posterior propulsion Oral - Nectar Cup: Delayed oral transit;Reduced posterior propulsion Oral - Thin Oral - Thin Teaspoon: Delayed oral transit;Reduced posterior propulsion Oral - Thin Cup: Delayed oral transit;Reduced posterior propulsion Oral - Thin Straw: Delayed oral transit;Reduced posterior propulsion Oral - Solids Oral - Puree: Delayed oral transit;Reduced posterior propulsion Oral - Regular: Impaired mastication;Delayed oral transit;Reduced posterior propulsion   Pharyngeal Phase Pharyngeal - Nectar Pharyngeal - Nectar Teaspoon: Delayed swallow initiation;Premature spillage to valleculae;Reduced pharyngeal peristalsis;Reduced epiglottic inversion;Reduced anterior laryngeal mobility;Reduced laryngeal elevation;Reduced airway/laryngeal closure;Pharyngeal residue - valleculae;Pharyngeal residue - pyriform  sinuses;Reduced tongue base retraction Pharyngeal - Nectar Cup: Delayed swallow initiation;Premature spillage to valleculae;Penetration/Aspiration during swallow;Pharyngeal residue - valleculae;Pharyngeal residue - pyriform sinuses Penetration/Aspiration details (nectar cup): Material enters airway, remains ABOVE vocal cords and not ejected out Pharyngeal - Thin Pharyngeal - Thin Teaspoon: Delayed swallow initiation;Premature spillage to valleculae;Penetration/Aspiration during swallow;Reduced epiglottic inversion;Reduced anterior laryngeal mobility;Reduced laryngeal elevation;Reduced pharyngeal peristalsis;Reduced airway/laryngeal closure;Reduced tongue base retraction;Pharyngeal residue - pyriform sinuses;Pharyngeal residue - valleculae Penetration/Aspiration details (thin teaspoon): Material enters airway, remains ABOVE vocal cords and not ejected out Pharyngeal - Thin Cup: Delayed swallow initiation;Premature spillage to valleculae;Reduced laryngeal elevation;Reduced anterior laryngeal mobility;Reduced epiglottic inversion;Reduced pharyngeal peristalsis;Reduced airway/laryngeal closure;Reduced tongue base retraction;Penetration/Aspiration during swallow;Trace aspiration;Pharyngeal residue - pyriform sinuses;Pharyngeal residue - valleculae Penetration/Aspiration details (thin cup): Material enters airway, passes BELOW cords without attempt by patient to eject out (silent aspiration) Pharyngeal - Thin Straw: Delayed swallow initiation;Premature spillage to valleculae;Reduced pharyngeal peristalsis;Reduced epiglottic inversion;Reduced anterior laryngeal mobility;Reduced laryngeal elevation;Reduced airway/laryngeal closure;Reduced tongue base retraction;Penetration/Aspiration during swallow;Pharyngeal residue - cp segment;Pharyngeal residue - posterior pharnyx Penetration/Aspiration details (thin straw): Material enters airway, passes BELOW cords without attempt by patient to eject out (silent  aspiration) Pharyngeal - Solids Pharyngeal - Puree: Delayed swallow initiation;Premature spillage to valleculae;Pharyngeal residue - pyriform sinuses;Reduced anterior laryngeal mobility;Reduced epiglottic inversion;Reduced laryngeal elevation;Reduced pharyngeal peristalsis;Reduced airway/laryngeal closure;Reduced tongue base retraction;Pharyngeal residue - valleculae Penetration/Aspiration details (puree): Material does not enter airway Pharyngeal - Regular: Delayed swallow initiation;Premature spillage to valleculae;Pharyngeal residue - valleculae;Reduced pharyngeal peristalsis;Reduced epiglottic inversion;Reduced anterior laryngeal mobility;Reduced laryngeal elevation;Reduced airway/laryngeal closure;Reduced tongue base retraction  Cervical Esophageal Phase    GO    Cervical Esophageal Phase Cervical Esophageal Phase: Impaired Cervical Esophageal Phase - Nectar Nectar Teaspoon: Reduced cricopharyngeal relaxation Nectar Cup: Reduced cricopharyngeal relaxation;Esophageal backflow into the pharynx Cervical Esophageal Phase - Thin Thin Teaspoon: Reduced cricopharyngeal relaxation Thin Cup: Reduced cricopharyngeal relaxation;Esophageal backflow into the pharynx Thin Straw: Reduced cricopharyngeal relaxation  Cervical Esophageal Phase - Solids Puree: Reduced cricopharyngeal relaxation Regular: Reduced cricopharyngeal relaxation Cervical Esophageal Phase - Comment Cervical Esophageal Comment: pt appeared with mild stasis in esophagus without awareness with frequent belching  and backflow        Luanna Salk, [place] [facility] SLP [phone]

## 2013-04-16 DIAGNOSIS — E43 Unspecified severe protein-calorie malnutrition: Secondary | ICD-10-CM

## 2013-04-16 LAB — GLUCOSE, CAPILLARY
Glucose-Capillary: 94 mg/dL (ref 70–99)
Glucose-Capillary: 141 mg/dL — ABNORMAL HIGH (ref 70–99)
Glucose-Capillary: 140 mg/dL — ABNORMAL HIGH (ref 70–99)
Glucose-Capillary: 160 mg/dL — ABNORMAL HIGH (ref 70–99)

## 2013-04-16 MED ORDER — PREDNISONE 20 MG PO TABS
40.0000 mg | ORAL_TABLET | Freq: Every day | ORAL | Status: DC
  Administered 2013-04-17: 40 mg via ORAL
  Filled 2013-04-16 (×3): qty 2

## 2013-04-16 NOTE — Progress Notes (Signed)
Patient 02 sats on room air at rest are 84%.  Sats at 96% on 3L at rest.

## 2013-04-16 NOTE — Progress Notes (Signed)
No new changes from previous nurse assessment, will continue to F/U with plan of care.

## 2013-04-16 NOTE — Progress Notes (Signed)
CSW met with patient. Patient is agreeable to [facility] for snf. Patient has been tearful. He states that he is just lonely. CSW offered emotional support. auth begun for [employer].  Koki Buxton C. Vineland MSW, [place]

## 2013-04-16 NOTE — Progress Notes (Signed)
Speech Language Pathology Treatment: Dysphagia  Patient Details Name: Benjamin Moss MRN: [id number] DOB: 10-29-1945 Today's Date: 04/16/2013 Time: 1165-7903 SLP Time Calculation (min): 22 min  Assessment / Plan / Recommendation Clinical Impression  Pt seen for skilled SLP intervention to assess tolerance of po diet, use of compensation strategies.  Pt is NOT following compensation techniques, SLP provided pt with oropharyngeal anatomy diagram and explained purpose of compensation strategies/written techniques- numbered 1-3.  SLP had pt demonstrate use of strategies and he waned from total cues to modified independence with trials.  Pt is coughing with intake - which unfortunately was not always indicative of airway compromise on MBS.  Use of straw caused pt to overtly "cough excessively" and pt admitted that he "choked".  Rec to continue diet with strict precautions.  This pt admits to coughing/choking with intake since his stroke in 2011, therefore mitigation of aspiration is functional goal.       HPI HPI: 68 yo male adm to [facility] 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  PMH + for cva with dysphagia requiring modified diet (dys3/honey per Nov 2011 MBS), pt states he stopped using thickener when he "ran out of money".  CT head recently showed remote right sided infarcts.  Pt admits to occasionally becoming "choked" on food more than drink.  BSE/MBS completed yesterday and pt placed on soft/thin diet with precautions.  SLP follow up today to assess tolerance of po diet and use of compensation strategies.     Pertinent Vitals Afebrile, decreased   SLP Plan  Continue with current plan of care    Recommendations Diet recommendations: Dysphagia 3 (mechanical soft);Thin liquid Medication Administration: Whole meds with puree (follow with liquids) Supervision: Patient able to self feed Compensations: Slow rate;Small sips/bites;Multiple dry swallows after each  bite/sip;Clear throat after each swallow;Hard cough after swallow Postural Changes and/or Swallow Maneuvers: Seated upright 90 degrees;Upright 30-60 min after meal              Oral Care Recommendations: Oral care BID Follow up Recommendations: Skilled Nursing facility Plan: Continue with current plan of care    [place], [place] SLP [phone]

## 2013-04-16 NOTE — Progress Notes (Signed)
CSW sent todays physical therapy note into [employer] for auth.  Susie Pousson C. [place] MSW, [place]

## 2013-04-16 NOTE — Progress Notes (Signed)
Physical Therapy Treatment Patient Details Name: Benjamin Moss MRN: [id number] DOB: February 02, 1946 Today's Date: 04/16/2013 Time: 1937-9024 PT Time Calculation (min): 33 min  PT Assessment / Plan / Recommendation  History of Present Illness 68 y.o. 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 Patient is unable to provide clear history secondary to his confusion. He states that it was very cold and hence his friend called 911 to bring him here. However per EDP report, patient did not have heat in his house and only has a stand alone he doesn't his bedroom. He had gone to the drug store to buy cigarettes and was picked up by EMS for mild confusion and found to be hypothermic. Patient does complain of cough with intermittent white sputum, chest pain on coughing and intermittent dyspnea. He denies fever, chills, headache, earache, sore throat, nausea, vomiting, abdominal pain, diarrhea, constipation, urinary frequency or dysuria. He states that he eats regular consistency food. In the ED, initial rectal temperature 91.5 which normalized to 98.4 with warming blanket, hypoxic, glucose 243, albumin 3, WBC 12.7, hemoglobin 10.8, negative troponin and CTA chest shows no PE but shows marked progression of metastatic lung cancer.    PT Comments   Pt in bed on 3 lts O2  avg 96%.  Assisted pt OOB to amb in hallway twice with one sitting rest break. Pt will need ST Rehab at SNF due to gait instability and limited activity tolerance.   Follow Up Recommendations  SNF     Does the patient have the potential to tolerate intense rehabilitation     Barriers to Discharge        Equipment Recommendations  None recommended by PT    Recommendations for Other Services    Frequency Min 3X/week   Progress towards PT Goals Progress  towards PT goals: Progressing toward goals  Plan      Precautions / Restrictions Precautions Precautions: Fall Precaution Comments: monitor O2 sats Restrictions Weight Bearing Restrictions: No     Pertinent Vitals/Pain SATURATION QUALIFICATIONS: (This note is used to comply with regulatory documentation for home oxygen)  Patient Saturations on Room Air at Rest = 91%  Patient Saturations on Room Air while Ambulating = 85%  Patient Saturations on 2 Liters of oxygen while Ambulating = 90%  Please briefly explain why patient needs home oxygen: pt present with 2/4 DOE and Hx     Mobility  Bed Mobility Overal bed mobility: Needs Assistance Bed Mobility: Supine to Sit;Sit to Supine Supine to sit: Min assist Sit to supine: Min assist General bed mobility comments: increased time and Min assist to support b LE on/ioff bed due to weakness Transfers Overall transfer level: Needs assistance Equipment used: Rolling walker (2 wheeled) Sit to Stand: Min assist;Mod assist General transfer comment: increased time and 25% VC's on hand placement esp with stand to sit. Ambulation/Gait Ambulation/Gait assistance: +2 physical assistance;Mod assist (for safety and equipment IV/dynamat) Ambulation Distance (Feet): 120 Feet (110 feet x 2 one sitting rest break) Assistive device: Rolling walker (2 wheeled) General Gait Details: Very unsteady gait with poor flex posture B hips/knees with limited activity tolerance and demon 32/4 DOE.  Lowest RA sat 84% but avg 88% during amb.  VC's on purse lip breathing and present with productive cough.     PT Goals (current goals can now be found in the care plan section)  Visit Information  Last PT Received On: 04/16/13 History of Present Illness: 68 y.o. 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 Patient is unable to provide clear history secondary to his confusion. He states that it was very cold and hence his friend called 911 to bring him here. However per EDP report, patient did not have heat in his house and only has a stand alone he doesn't his bedroom. He had gone to the drug store to buy cigarettes and was picked up by EMS for mild confusion and found to be hypothermic. Patient does complain of cough with intermittent white sputum, chest pain on coughing and intermittent dyspnea. He denies fever, chills, headache, earache, sore throat, nausea, vomiting, abdominal pain, diarrhea, constipation, urinary frequency or dysuria. He states that he eats regular consistency food. In the ED, initial rectal temperature 91.5 which normalized to 98.4 with warming blanket, hypoxic, glucose 243, albumin 3, WBC 12.7, hemoglobin 10.8, negative troponin and CTA chest shows no PE but shows marked progression of metastatic lung cancer.     Subjective Data      Cognition       Balance     End of Session PT - End of Session Equipment Utilized During Treatment: Gait belt;Oxygen Activity Tolerance: Treatment limited secondary to medical complications (Comment) Patient left: in bed;with call bell/phone within reach   Rica Koyanagi  PTA [facility]  Acute  Rehab Pager      [phone]

## 2013-04-16 NOTE — Progress Notes (Signed)
[facility] PROGRESS NOTE    Benjamin Moss CXK:[id number] DOB: Aug 28, 1945 DOA: 04/13/2013 PCP: No primary provider on file.  HPI/Brief narrative 68 y.o. 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 CT chest showed marked progression of metastatic lung cancer. Patient clinically improved and awaiting SNF bed. Oncology plans chemotherapy during outpatient followup in one-2 weeks.   Assessment/Plan:  Acute hypoxic respiratory failure  - Secondary to metastatic lung cancer and COPD exacerbation. No PE on CT chest.  - Treated empirically with oxygen, nebulizations, IV Solu-Medrol and oral doxycycline. - Clinically improving. Will transition steroids to oral prednisone taper. Complete 7 days of oral doxycycline. Will need oxygen at discharge.  Hypothermia  - Likely secondary to cold exposure and less likely due to sepsis. Patient is status post single dose of IV vancomycin and Zosyn. CT chest not suggestive of infection. Urine microscopy negative.  - TSH normal. Blood cultures x2: Negative to date. - Resolved after warming blankets.  Acute encephalopathy  -  Resolved  -  Mental status changes could be multifactorial secondary to hypothermia & hypoxemia. No obvious focal deficits.  CT head with contrast negative for acute findings or metastasis.  Dehydration  - Resolved after IV fluids.   Metastatic small small cell lung cancer  - It appears as though patient has not kept his followup appointment with oncology.  - Oncology consultation 1/12 appreciated: Plan followup appointment at [place] within one-2 weeks after discharge for chemotherapy consideration.  Mild COPD exacerbation  - Management as above -  improving. Change  steroids to by mouth prednisone taper.   Hyperglycemia/Type II DM-new diagnosis  -  hemoglobin A1c: 6.7. SSI.   Tobacco abuse  - Cessation counseling and patient oriented. Nicotine patch.   Hypertension  - Controlled   Anemia  - Likely from chronic disease. Stable.   History of CVA  - Continue Plavix. Dysphagia 3 diet and thin liquids as per speech therapy recommendation.  Hypokalemia  - Replaced -  magnesium normal  Severe malnutrition in the context of chronic disease - Management as per dietitian consult   Code Status:  Full Family Communication:  None at bedside.  Disposition Plan: SNF when bed available.   Consultants:  Medical oncology  Speech therapy  Procedures:  None   Antibiotics:  Oral doxycycline 1/10 >   Subjective: States that he feels "okay". Denies dyspnea or chest pain or headache. Denies feeling sad or depressed at this time.  Objective: Filed Vitals:   04/16/13 0548 04/16/13 0804 04/16/13 1300 04/16/13 1417  BP: 119/84  110/76   Pulse: 74  80   Temp: 98.1 F (36.7 C)  97.5 F (36.4 C)   TempSrc: Oral  Oral   Resp: 22  20   Height:      Weight: 50 kg (110 lb 3.7 oz)     SpO2: 93% 96% 91% 94%    Intake/Output Summary (Last 24 hours) at 04/16/13 1533 Last data filed at 04/16/13 1412  Gross per 24 hour  Intake    480 ml  Output   1625 ml  Net  -1145 ml   Filed Weights   04/14/13 0526 04/15/13 0450 04/16/13 0548  Weight: 47.6 kg (104 lb 15 oz) 47.4 kg (104 lb 8 oz) 50 kg (110 lb 3.7 oz)  Exam:  General exam: Small built thin cachectic, chronically ill looking male, lying comfortably in bed. Respiratory system: Distant breath sounds but seemed clear to auscultation without rhonchi, wheezing or crackles. No increased work of breathing.  Cardiovascular system: S1 & S2 heard, RRR. No JVD, murmurs, gallops, clicks or pedal edema.  Gastrointestinal system: Abdomen is nondistended, soft and nontender. Normal bowel sounds  heard. Central nervous system: Alert and oriented. No focal neurological deficits. Extremities: Right upper extremity grade 5/5 power, bilateral lower extremities grade 4+/5 power. Left upper extremity grade 3/5 power- from prior stroke   Data Reviewed: Basic Metabolic Panel:  Recent Labs Lab 04/13/13 1025 04/14/13 0750 04/15/13 0619  NA 136* 139 140  K 4.1 3.1* 3.9  CL 94* 103 104  CO2 19 23 25   GLUCOSE 243* 117* 99  BUN 18 20 23   CREATININE 1.13 0.90 0.87  CALCIUM 10.5 9.0 9.3  MG  --  1.8  --    Liver Function Tests:  Recent Labs Lab 04/13/13 1025  AST 13  ALT 6  ALKPHOS 72  BILITOT 0.4  PROT 7.3  ALBUMIN 3.0*    Recent Labs Lab 04/13/13 1025  LIPASE 18   No results found for this basename: AMMONIA,  in the last 168 hours CBC:  Recent Labs Lab 04/13/13 1233 04/14/13 0750 04/15/13 0619  WBC 12.7* 9.0 9.6  NEUTROABS 12.0*  --   --   HGB 10.8* 9.3* 9.1*  HCT 31.4* 26.3* 26.9*  MCV 90.5 88.0 89.7  PLT 323 333 301   Cardiac Enzymes:  Recent Labs Lab 04/13/13 1025  TROPONINI <0.30   BNP (last 3 results) No results found for this basename: PROBNP,  in the last 8760 hours CBG:  Recent Labs Lab 04/15/13 1452 04/15/13 1707 04/15/13 2159 04/16/13 0731 04/16/13 1215  GLUCAP 131* 140* 126* 94 141*    Recent Results (from the past 240 hour(s))  CULTURE, BLOOD (ROUTINE X 2)     Status: None   Collection Time    04/13/13 10:25 AM      Result Value Range Status   Specimen Description BLOOD RIGHT CHEST  5 ML IN [facility] BOTTLE   Final   Special Requests NONE   Final   Culture  Setup Time     Final   Value: 04/13/2013 17:49     Performed at [facility]   Culture     Final   Value:        BLOOD CULTURE RECEIVED NO GROWTH TO DATE CULTURE WILL BE HELD FOR 5 DAYS BEFORE ISSUING A FINAL NEGATIVE REPORT     Performed at [facility]   Report Status PENDING   Incomplete  CULTURE, BLOOD (ROUTINE X 2)     Status: None   Collection Time     04/13/13 12:39 PM      Result Value Range Status   Specimen Description BLOOD RIGHT FOREARM  4 ML IN [facility] BOTTLE   Final   Special Requests NONE   Final   Culture  Setup Time     Final   Value: 04/13/2013 17:48     Performed at [facility]   Culture     Final   Value:        BLOOD CULTURE RECEIVED NO GROWTH TO DATE CULTURE WILL BE HELD FOR 5 DAYS BEFORE ISSUING A FINAL NEGATIVE REPORT     Performed at [facility]   Report Status PENDING   Incomplete  Additional labs: 1. TSH: 1.646  2.  Hemoglobin A1c: 6.7      Studies: Dg Swallowing Func-speech Pathology  04/15/2013   Macario Golds, CCC-SLP     04/15/2013  1:53 PM Objective Swallowing Evaluation: Modified Barium Swallowing Study   Patient Details  Name: HARROL NOVELLO MRN: [id number] Date of Birth: 09/30/1945  Today's Date: 04/15/2013 Time: 1240-1315 SLP Time Calculation (min): 35 min  Past Medical History:  Past Medical History  Diagnosis Date  . Hyperlipidemia   . Hypertension   . COPD (chronic obstructive pulmonary disease)   . Stroke     left side weakness, ambulates with cane/ walker  . Right lower lobe lung mass   . Hypoxia   . Nodule of left lung   . Hx of radiation therapy 02/07/12-05/10/12    R central chest hilar,mediastinal region 63 gray, 35 fx  . Lung cancer 01/19/2012   Past Surgical History:  Past Surgical History  Procedure Laterality Date  . 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  PMH + for cva with  dysphagia requiring modified diet (dys3/honey per Nov 2011 MBS),  pt states he stopped using thickener when he "ran out of money".   CT head recently showed remote right sided infarcts.  Pt admits  to occasionally becoming "choked" on food more than drink.       Assessment / Plan / Recommendation Clinical Impression  Dysphagia Diagnosis: Moderate oral phase dysphagia;Moderate  pharyngeal phase dysphagia;Moderate cervical  esophageal phase  dysphagia   Clinical impression: Moderate oropharyngeal and cervical  esophageal dysphagia with sensorimotor deficits- likely from CVA  with exacerbation from acute illness.  Delayed oral transiting,  decreased oral propulsion due to weakness noted.  Pharyngeal  swallow characterized by delayed swallow initiation (up to 4  seconds at pyriform), decreased laryngeal elevation/closure  contributing to stasis/laryngeal penetration.  Pt independently  conducted extended breath hold with MULTIPLE dry swallows aiding  pharyngeal clearance.  This maneuever is tiring for pt but  effective.  Cued throat clear/cough removed majority of laryngeal  penetrates/aspirates.  Chin tuck, head turn postures not helpful  to decrease stasis.   Mild silent aspiration of thin was cleared  with cued cough- aspiration noted with thin conducting chin tuck.      Please note, pt belched during testing with appearance of  backflow to proximal esophagus - suspect esoph component to his  dysphagia.  Also pt noted to cough during testing, which was not  always indicative of airway infiltration of barium.  Skilled  intervention included educating pt to findings, reinforcement of  effective compensation strategies.  Recommend continue current  diet (soft/thin) with strict precautions.  His asp risk did not  significantly decrease with thick vs thin liquid, therefore for  QOL rec thin.  Also recommend pt follow esophageal precautions  due to his report of "vomiting" at home during each meal.       Treatment Recommendation  Therapy as outlined in treatment plan below    Diet Recommendation Dysphagia 3 (Mechanical Soft);Thin liquid   Liquid Administration via: Cup Medication Administration: Whole meds with puree (follow with  liquid) Supervision: Patient able to self feed Compensations: Slow rate;Small sips/bites;Multiple dry swallows  after each bite/sip;Follow solids with liquid;Hard cough after  swallow Postural Changes and/or  Swallow Maneuvers: Seated upright 90  degrees;Upright 30-60 min after meal    Other  Recommendations Recommended Consults: MBS Oral Care Recommendations: Oral care BID   Follow Up Recommendations  Skilled Nursing facility    Frequency and Duration min 2x/week  2 weeks   Pertinent Vitals/Pain Afebrile, decreased   SLP Swallow Goals     General Date of Onset: 04/15/13 HPI: 68 yo male adm to 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  PMH + for cva with  dysphagia requiring modified diet (dys3/honey per Nov 2011 MBS),  pt states he stopped using thickener when he "ran out of money".   CT head recently showed remote right sided infarcts.  Pt admits  to occasionally becoming "choked" on food more than drink.   Type of Study: Modified Barium Swallowing Study Reason for Referral: Objectively evaluate swallowing function Diet Prior to this Study: Dysphagia 3 (soft);Thin liquids Temperature Spikes Noted: No Respiratory Status: Nasal cannula History of Recent Intubation: No Behavior/Cognition: Alert;Cooperative;Pleasant mood (delayed  responses) Oral Cavity - Dentition: Poor condition (lower no dentition) Oral Motor / Sensory Function: Impaired - see Bedside swallow  eval Self-Feeding Abilities: Able to feed self Patient Positioning: Upright in chair Baseline Vocal Quality: Clear Volitional Cough: Weak (nonproductive) Volitional Swallow: Able to elicit Anatomy:  (appearance of cervical osteophytes C4-C5, C5-C6) Pharyngeal Secretions: Standing secretions in (comment) (pharynx)     Reason for Referral Objectively evaluate swallowing function   Oral Phase Oral Preparation/Oral Phase Oral Phase: Impaired Oral - Nectar Oral - Nectar Teaspoon: Delayed oral transit;Reduced posterior  propulsion Oral - Nectar Cup: Delayed oral transit;Reduced posterior  propulsion Oral - Thin Oral - Thin Teaspoon: Delayed oral transit;Reduced posterior  propulsion Oral - Thin Cup: Delayed oral  transit;Reduced posterior  propulsion Oral - Thin Straw: Delayed oral transit;Reduced posterior  propulsion Oral - Solids Oral - Puree: Delayed oral transit;Reduced posterior propulsion Oral - Regular: Impaired mastication;Delayed oral transit;Reduced  posterior propulsion   Pharyngeal Phase Pharyngeal - Nectar Pharyngeal - Nectar Teaspoon: Delayed swallow  initiation;Premature spillage to valleculae;Reduced pharyngeal  peristalsis;Reduced epiglottic inversion;Reduced anterior  laryngeal mobility;Reduced laryngeal elevation;Reduced  airway/laryngeal closure;Pharyngeal residue -  valleculae;Pharyngeal residue - pyriform sinuses;Reduced tongue  base retraction Pharyngeal - Nectar Cup: Delayed swallow initiation;Premature  spillage to valleculae;Penetration/Aspiration during  swallow;Pharyngeal residue - valleculae;Pharyngeal residue -  pyriform sinuses Penetration/Aspiration details (nectar cup): Material enters  airway, remains ABOVE vocal cords and not ejected out Pharyngeal - Thin Pharyngeal - Thin Teaspoon: Delayed swallow initiation;Premature  spillage to valleculae;Penetration/Aspiration during  swallow;Reduced epiglottic inversion;Reduced anterior laryngeal  mobility;Reduced laryngeal elevation;Reduced pharyngeal  peristalsis;Reduced airway/laryngeal closure;Reduced tongue base  retraction;Pharyngeal residue - pyriform sinuses;Pharyngeal  residue - valleculae Penetration/Aspiration details (thin teaspoon): Material enters  airway, remains ABOVE vocal cords and not ejected out Pharyngeal - Thin Cup: Delayed swallow initiation;Premature  spillage to valleculae;Reduced laryngeal elevation;Reduced  anterior laryngeal mobility;Reduced epiglottic inversion;Reduced  pharyngeal peristalsis;Reduced airway/laryngeal closure;Reduced  tongue base retraction;Penetration/Aspiration during  swallow;Trace aspiration;Pharyngeal residue - pyriform  sinuses;Pharyngeal residue - valleculae Penetration/Aspiration details (thin  cup): Material enters  airway, passes BELOW cords without attempt by patient to eject  out (silent aspiration) Pharyngeal - Thin Straw: Delayed swallow initiation;Premature  spillage to valleculae;Reduced pharyngeal peristalsis;Reduced  epiglottic inversion;Reduced anterior laryngeal mobility;Reduced  laryngeal elevation;Reduced airway/laryngeal closure;Reduced  tongue base retraction;Penetration/Aspiration during  swallow;Pharyngeal residue - cp segment;Pharyngeal residue -  posterior pharnyx Penetration/Aspiration details (thin straw): Material enters  airway, passes BELOW cords without attempt by patient to eject  out (silent aspiration) Pharyngeal - Solids Pharyngeal - Puree: Delayed swallow initiation;Premature spillage  to valleculae;Pharyngeal residue - pyriform sinuses;Reduced  anterior laryngeal mobility;Reduced epiglottic inversion;Reduced  laryngeal elevation;Reduced pharyngeal peristalsis;Reduced  airway/laryngeal closure;Reduced tongue base  retraction;Pharyngeal residue - valleculae Penetration/Aspiration details (puree): Material does not enter  airway Pharyngeal - Regular: Delayed swallow initiation;Premature  spillage to valleculae;Pharyngeal residue - valleculae;Reduced  pharyngeal peristalsis;Reduced epiglottic inversion;Reduced  anterior laryngeal mobility;Reduced laryngeal elevation;Reduced  airway/laryngeal closure;Reduced tongue base retraction  Cervical Esophageal Phase    GO    Cervical Esophageal Phase Cervical Esophageal Phase: Impaired Cervical Esophageal Phase - Nectar Nectar Teaspoon: Reduced cricopharyngeal relaxation Nectar Cup: Reduced cricopharyngeal relaxation;Esophageal  backflow into the pharynx Cervical Esophageal Phase - Thin Thin Teaspoon: Reduced cricopharyngeal relaxation Thin Cup: Reduced cricopharyngeal relaxation;Esophageal backflow  into the pharynx Thin Straw: Reduced cricopharyngeal relaxation Cervical Esophageal Phase - Solids Puree: Reduced cricopharyngeal relaxation  Regular: Reduced cricopharyngeal relaxation Cervical Esophageal Phase - Comment Cervical Esophageal Comment: pt appeared with mild stasis in  esophagus without awareness with frequent belching  and backflow        Luanna Salk, MS [facility] SLP [phone]         Scheduled Meds: . amLODipine  10 mg Oral Daily  . clopidogrel  75 mg Oral Daily  . doxycycline  100 mg Oral Q12H  . enoxaparin (LOVENOX) injection  40 mg Subcutaneous Q24H  . feeding supplement (ENSURE COMPLETE)  237 mL Oral TID BM  . guaiFENesin  600 mg Oral BID  . insulin aspart  0-9 Units Subcutaneous TID WC  . ipratropium-albuterol  3 mL Nebulization TID  . methylPREDNISolone (SOLU-MEDROL) injection  40 mg Intravenous Q8H  . nicotine  21 mg Transdermal Daily  . sodium chloride  3 mL Intravenous Q12H   Continuous Infusions:   Principal Problem:   Acute respiratory failure with hypoxia Active Problems:   COPD   Hyperlipidemia   Hypertension   Lung cancer   Leukocytosis   Hypothermia   Acute respiratory failure   Anemia   Acute encephalopathy   Dehydration   Metastatic primary lung cancer   Hypokalemia   Protein-calorie malnutrition, severe    Time spent:  40 minutes     Jamerius Boeckman, MD, FACP, FHM. [facility] Pager [phone]  If 7PM-7AM, please contact night-coverage [email or web address] Password [email or web address] 04/16/2013, 3:33 PM    LOS: 3 days

## 2013-04-17 DIAGNOSIS — R0902 Hypoxemia: Secondary | ICD-10-CM

## 2013-04-17 DIAGNOSIS — I10 Essential (primary) hypertension: Secondary | ICD-10-CM

## 2013-04-17 DIAGNOSIS — F17200 Nicotine dependence, unspecified, uncomplicated: Secondary | ICD-10-CM

## 2013-04-17 LAB — GLUCOSE, CAPILLARY
GLUCOSE-CAPILLARY: 87 mg/dL (ref 70–99)
Glucose-Capillary: 108 mg/dL — ABNORMAL HIGH (ref 70–99)

## 2013-04-17 MED ORDER — GUAIFENESIN ER 600 MG PO TB12: 600.0000 mg | ORAL_TABLET | Freq: Two times a day (BID) | ORAL | Status: AC

## 2013-04-17 MED ORDER — HYDROCODONE-ACETAMINOPHEN 5-325 MG PO TABS: 1.0000 | ORAL_TABLET | Freq: Four times a day (QID) | ORAL | Status: DC | PRN

## 2013-04-17 MED ORDER — NICOTINE 21 MG/24HR TD PT24: 21.0000 mg | MEDICATED_PATCH | Freq: Every day | TRANSDERMAL | Status: AC

## 2013-04-17 MED ORDER — ENSURE COMPLETE PO LIQD: 237.0000 mL | Freq: Three times a day (TID) | ORAL | Status: AC

## 2013-04-17 MED ORDER — HEPARIN SOD (PORK) LOCK FLUSH 100 UNIT/ML IV SOLN
500.0000 [IU] | INTRAVENOUS | Status: AC | PRN
  Administered 2013-04-17: 500 [IU]

## 2013-04-17 NOTE — Progress Notes (Addendum)
Patient cleared for discharge. [employer] auth obtained. Packet copied and placed in [place]. [facility] can accept patient at 130pm to sign his own paperwork. CSW met with patient. Patient is alert and oriented X3. Patient is in better spirits today and appears hopeful that his condition will improve. He continues to be agreeable to discharge to [facility] today. CSW asked if there was anyone CSW needed to contact, he states that he is calling them right now. ptar called for 130pm.  Yuliana Vandrunen C. Kamas MSW, [place]

## 2013-04-17 NOTE — Discharge Summary (Signed)
Physician Discharge Summary  Benjamin Moss DVV:[id number] DOB: Mar 12, 1946 DOA: 04/13/2013  PCP: No primary provider on file.  Admit date: 04/13/2013 Discharge date: 04/17/2013  Time spent: 45 minutes  Recommendations for Outpatient Follow-up:   Acute hypoxic respiratory failure  - Secondary to metastatic lung cancer and COPD exacerbation. No PE on CT chest.  -  Continue O2 via [place] @ 4 L. Titrate to maintain SPO2> 92% - D/C  With Albuterol PRN.  -Will have Pt F/U to establish care with [facility] pulmonology and request spirometry pre-/post bronchodilator, DLCO.  - Complete 7 days of oral doxycycline. Will need oxygen at discharge.   Hypothermia  - Likely secondary to cold exposure and less likely due to sepsis. Patient is status post single dose of IV vancomycin and Zosyn. CT chest not suggestive of infection. Urine microscopy negative.  - TSH normal. Blood cultures x2: Negative to date.  - Resolved after warming blankets.   Acute encephalopathy  - Resolved  - CT head with contrast negative for acute findings or metastasis.   Dehydration  - Resolved after IV fluids.   Metastatic small small cell lung cancer  - Pt has not kept his followup appointment with oncology.  - Schedule patient to follow with Dr. Lorna Few (Oncology) in one week to discuss cancer plan of care as an outpatient .   Mild COPD exacerbation  - Patient has never been evaluated by pulmonologist will have patient followup [facility] pulmonology for spirometry pre-/post bronchodilator, DLCO  -Continue albuterol PRN until evaluated by pulmonology    Hyperglycemia/Type II DM-new diagnosis  - hemoglobin A1c: 6.7. SSI.   Tobacco abuse  - Cessation counseling and patient oriented. Nicotine patch.    Hypertension  - Controlled   Anemia  - Likely from chronic disease. Stable.   History of CVA  - Continue Plavix. Dysphagia 3 diet and thin liquids as per speech therapy recommendation.   Hypokalemia  - Replaced    - magnesium normal   Severe malnutrition in the context of chronic disease  - Ensure Complete po TID, each supplement provides 350 kcal and 13 grams of protein  - Encouraged frequent snacking and meals for weight gain  - Provided examples of ways to increase protein intake in diet    Discharge Diagnoses:  Principal Problem:   Acute respiratory failure with hypoxia Active Problems:   COPD   Hyperlipidemia   Hypertension   Lung cancer   Leukocytosis   Hypothermia   Acute respiratory failure   Anemia   Acute encephalopathy   Dehydration   Metastatic primary lung cancer   Hypokalemia   Protein-calorie malnutrition, severe   Discharge Condition: Stable  Diet recommendation: Dysphagia 3 diet and thin liquids  Filed Weights   04/15/13 0450 04/16/13 0548 04/17/13 0511  Weight: 47.4 kg (104 lb 8 oz) 50 kg (110 lb 3.7 oz) 51 kg (112 lb 7 oz)    History of present illness:  68 y.o. 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 CT chest showed marked progression of metastatic lung cancer.  The following is a history of patient's NSCLC treatment; smoker with a history of synchronous lung cancer presenting as stage IIIa non-small cell lung cancer and the right lung as well as a stage IA at the left lung base who was undergoing concurrent chemoradiation,  last cycle on last cycle, #7, day 1 on 04/30/2012, last seen at the office on 07/19/12, after which time he lost to follow up, despite attempts to reach him. It appears that he believed that he "was finished with treatment" per patient report . CT scans of the chest performed the end of February 2014 had shown interval decrease size of right lower lobe mass consistent with positive response to  therapy.The small hypermetabolic nodule in the superior segment of the left lower lobe had also slightly decreased in size Also, stable to improved small mediastinal lymph nodes with no progression of disease was noted. Continued observation would repeat the scans within 3 months was recommended. However, the patient failed to followup since.  In addition, he was last seen by radiation oncology in March of 2014, and failed to followup in 6 months time, as it was recommended. Per chart note, if the disease in the right chest were to be into remission, SBRT for his left lung lesion was to be considered.  On 04/13/2013, the patient brought by EMS to the emergency Department with progressive shortness of breath at rest,as well as confusion.he was also found to be hypothermic by EMS with initial rectal temperature of 91.5.cardiac enzymes were negative. CTA on 1/10 with contrast showed no pulmonary emboli, however marked progression of metastatic lung cancer was noted. The primary mass centered in the superior segment of the right lower lobe shows significant interval enlargement measuring 5.7 x 6.4 cm. Adjacent parenchymal changes show post radiation therapy changes. There may be lymphangitic spread of tumor in the RLL as well . Innumerable new metastatic nodules are identified throughout all lobes of both lungs. Largest are a 1.2 cm nodule in the right middle lobe, 1.3 cm nodule at the posterior basilar right lower lobe and 1.3 cm nodule in theLLL. There are too many new metastatic nodules to count. Progressive lymphadenopathy is also identified in the right hilum and mediastinum. Largest right hilar lymph node measures 2.3 x 3.0 cm. Subcarinal lymphadenopathy measures 1.8 cm . There is a trace amount of pleural fluid on the right. No pericardial fluid is identified. The heart size is normal. No bony lesions are identified.CT of the head with contrast on 04/14/2013 showedNo evidence of acute intracranial abnormality  or metastatic disease.  He was admitted for further workup and management of symptoms, including bronchodilators and IV Solu-Medrol, N. Now on oral doxycycline for mild COPD exacerbation.Marland Kitchen He is also on IV fluids to improve his dehydration status. As he continues to smoke, concerning for a for tobacco cessation was recommended, and a nicotine patch was placed.he is full code.   04/17/2013 patient currently on 4 L O2 via [place] states negative SOB, negative CP states his understanding of the plan of care is that he will be discharged today SNF. Also states he understands he is to followup with Dr. Inda Merlin (oncologist) for further chemotherapy.     Procedures:  Consultations:  Dr. Lorna Few (oncology)    Discharge Exam: Filed Vitals:   04/16/13 1937 04/16/13 2155 04/17/13 0511 04/17/13 0908  BP:  98/73 115/81   Pulse:  91 80   Temp:  98.5 F (36.9 C) 98.1 F (36.7 C)   TempSrc:  Oral Oral   Resp:  28 24   Height:      Weight:   51 kg (112 lb 7 oz)   SpO2: 97% 96% 93% 94%    General: A./O. x4, NAD Cardiovascular: Regular rhythm and rate, negative murmurs  rubs or gallops Respiratory: Very coarse decreased breath sounds RLL> LLL  Discharge Instructions     Medication List    ASK your doctor about these medications       albuterol 108 (90 BASE) MCG/ACT inhaler  Commonly known as:  PROVENTIL HFA;VENTOLIN HFA  Inhale 2 puffs into the lungs every 6 (six) hours as needed for wheezing.     amLODipine 10 MG tablet  Commonly known as:  NORVASC  Take 10 mg by mouth daily.     clopidogrel 75 MG tablet  Commonly known as:  PLAVIX  Take 75 mg by mouth daily.     hydrochlorothiazide 25 MG tablet  Commonly known as:  HYDRODIURIL  Take 25 mg by mouth daily.       No Known Allergies    The results of significant diagnostics from this hospitalization (including imaging, microbiology, ancillary and laboratory) are listed below for reference.    Significant Diagnostic  Studies: Ct Head W Wo Contrast  04/14/2013   CLINICAL DATA:  68 year old male with left hemiparesis and headache. History of lung cancer. Marland Kitchen  EXAM: CT HEAD WITHOUT AND WITH CONTRAST  TECHNIQUE: Contiguous axial images were obtained from the base of the skull through the vertex without and with intravenous contrast  CONTRAST:  161mL OMNIPAQUE IOHEXOL 300 MG/ML  SOLN  COMPARISON:  01/09/2012 MRI and 02/08/2010 CT  FINDINGS: Remote right basal ganglia and right frontoparietal infarcts again noted.  Mild chronic small-vessel white matter ischemic changes are again identified.  No acute intracranial abnormalities are identified, including abnormal enhancement, mass lesion or mass effect, hydrocephalus, extra-axial fluid collection, midline shift, hemorrhage, or acute infarction. The visualized bony calvarium is unremarkable.  IMPRESSION: No evidence of acute intracranial abnormality or metastatic disease.  Remote right side infarcts and chronic small-vessel white matter ischemic changes.   Electronically Signed   By: Hassan Rowan M.D.   On: 04/14/2013 13:52   Ct Angio Chest Pe W/cm &/or Wo Cm  04/13/2013   CLINICAL DATA:  Shortness of breath and history of lung carcinoma.  EXAM: CT ANGIOGRAPHY CHEST WITH CONTRAST  TECHNIQUE: Multidetector CT imaging of the chest was performed using the standard protocol during bolus administration of intravenous contrast. Multiplanar CT image reconstructions including MIPs were obtained to evaluate the vascular anatomy.  CONTRAST:  158mL OMNIPAQUE IOHEXOL 350 MG/ML SOLN  COMPARISON:  Multiple prior CT exams of the chest. The latest is dated 09/18/2012.  FINDINGS: The pulmonary arteries are well opacified at the time of the examination. There is no evidence of pulmonary embolism.  There is significant progression of lung carcinoma and metastatic disease in the chest. The primary mass centered in the superior segment of the right lower lobe shows significant interval enlargement with  maximal transverse dimensions of approximately 5.7 x 6.4 cm. Adjacent parenchymal changes likely reflect post radiation therapy changes. There may be lymphangitic spread of tumor in the right lower lobe, as well, which is strongly suspected given the multitude of other metastatic lesions identified.  Innumerable new metastatic nodules are identified throughout all lobes of both lungs. Largest measured nodules are a 1.2 cm nodule in the right middle lobe, 1.3 cm nodule at the posterior basilar right lower lobe and 1.3 cm nodule in the left lower lobe. There are too many new metastatic nodules to count.  Progressive lymphadenopathy is also identified in the right hilum and mediastinum. Largest right hilar lymph node measures approximately 2.3 x 3.0 cm. Subcarinal lymphadenopathy measures 1.8 cm in short  axis.  There is a trace amount of pleural fluid on the right. No pericardial fluid is identified. The heart size is normal. No bony lesions are identified.  Review of the MIP images confirms the above findings.  IMPRESSION: Marked progression of metastatic lung carcinoma in the chest with significant enlargement of the primary right lower lobe mass as well as innumerable new metastatic nodules throughout both lungs. There may also be lymphangitic spread of tumor in the right lower lobe.   Electronically Signed   By: Aletta Edouard M.D.   On: 04/13/2013 13:05   Dg Chest Port 1 View  04/13/2013   CLINICAL DATA:  Shortness of breath.  Lung cancer.  EXAM: PORTABLE CHEST - 1 VIEW  COMPARISON:  Chest CT 09/18/2012.  FINDINGS: The a power port is in good position. The cardiac silhouette, mediastinal and hilar contours are stable. There is a large right hilar mass and right hilar adenopathy. Underlying advanced emphysematous changes with increased interstitial markings in both lungs, right greater than left which could reflect asymmetric edema. Could not exclude the possibly of interstitial spread of tumor on the right.  Suspect small pulmonary nodules No definite pleural effusion. The bony thorax is grossly intact.  IMPRESSION: Enlarging right hilar mass and hilar adenopathy.  Increased interstitial markings in both lungs could reflect asymmetric noncardiogenic pulmonary edema. Could not exclude the possibility of interstitial spread of tumor in the right lung.  Underlying emphysematous changes.  Suspect pulmonary metastasis.   Electronically Signed   By: Kalman Jewels M.D.   On: 04/13/2013 11:07   Dg Swallowing Func-speech Pathology  04/15/2013   Macario Golds, CCC-SLP     04/15/2013  1:53 PM Objective Swallowing Evaluation: Modified Barium Swallowing Study   Patient Details  Name: Benjamin Moss MRN: [id number] Date of Birth: Mar 14, 1946  Today's Date: 04/15/2013 Time: 1240-1315 SLP Time Calculation (min): 35 min  Past Medical History:  Past Medical History  Diagnosis Date  . Hyperlipidemia   . Hypertension   . COPD (chronic obstructive pulmonary disease)   . Stroke     left side weakness, ambulates with cane/ walker  . Right lower lobe lung mass   . Hypoxia   . Nodule of left lung   . Hx of radiation therapy 02/07/12-05/10/12    R central chest hilar,mediastinal region 63 gray, 35 fx  . Lung cancer 01/19/2012   Past Surgical History:  Past Surgical History  Procedure Laterality Date  . Tonsillectomy     HPI:  68 yo male adm to [facility] with dyspnea at rest, found to have  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  PMH + for cva with  dysphagia requiring modified diet (dys3/honey per Nov 2011 MBS),  pt states he stopped using thickener when he "ran out of money".   CT head recently showed remote right sided infarcts.  Pt admits  to occasionally becoming "choked" on food more than drink.       Assessment / Plan / Recommendation Clinical Impression  Dysphagia Diagnosis: Moderate oral phase dysphagia;Moderate  pharyngeal phase dysphagia;Moderate cervical esophageal phase  dysphagia   Clinical impression: Moderate oropharyngeal and  cervical  esophageal dysphagia with sensorimotor deficits- likely from CVA  with exacerbation from acute illness.  Delayed oral transiting,  decreased oral propulsion due to weakness noted.  Pharyngeal  swallow characterized by delayed swallow initiation (up to 4  seconds at pyriform), decreased laryngeal elevation/closure  contributing to stasis/laryngeal penetration.  Pt independently  conducted extended breath hold  with MULTIPLE dry swallows aiding  pharyngeal clearance.  This maneuever is tiring for pt but  effective.  Cued throat clear/cough removed majority of laryngeal  penetrates/aspirates.  Chin tuck, head turn postures not helpful  to decrease stasis.   Mild silent aspiration of thin was cleared  with cued cough- aspiration noted with thin conducting chin tuck.      Please note, pt belched during testing with appearance of  backflow to proximal esophagus - suspect esoph component to his  dysphagia.  Also pt noted to cough during testing, which was not  always indicative of airway infiltration of barium.  Skilled  intervention included educating pt to findings, reinforcement of  effective compensation strategies.  Recommend continue current  diet (soft/thin) with strict precautions.  His asp risk did not  significantly decrease with thick vs thin liquid, therefore for  QOL rec thin.  Also recommend pt follow esophageal precautions  due to his report of "vomiting" at home during each meal.       Treatment Recommendation  Therapy as outlined in treatment plan below    Diet Recommendation Dysphagia 3 (Mechanical Soft);Thin liquid   Liquid Administration via: Cup Medication Administration: Whole meds with puree (follow with  liquid) Supervision: Patient able to self feed Compensations: Slow rate;Small sips/bites;Multiple dry swallows  after each bite/sip;Follow solids with liquid;Hard cough after  swallow Postural Changes and/or Swallow Maneuvers: Seated upright 90  degrees;Upright 30-60 min after meal    Other   Recommendations Recommended Consults: MBS Oral Care Recommendations: Oral care BID   Follow Up Recommendations  Skilled Nursing facility    Frequency and Duration min 2x/week  2 weeks   Pertinent Vitals/Pain Afebrile, decreased   SLP Swallow Goals     General Date of Onset: 04/15/13 HPI: 68 yo male adm to [facility] with dyspnea at rest, found to have  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  PMH + for cva with  dysphagia requiring modified diet (dys3/honey per Nov 2011 MBS),  pt states he stopped using thickener when he "ran out of money".   CT head recently showed remote right sided infarcts.  Pt admits  to occasionally becoming "choked" on food more than drink.   Type of Study: Modified Barium Swallowing Study Reason for Referral: Objectively evaluate swallowing function Diet Prior to this Study: Dysphagia 3 (soft);Thin liquids Temperature Spikes Noted: No Respiratory Status: Nasal cannula History of Recent Intubation: No Behavior/Cognition: Alert;Cooperative;Pleasant mood (delayed  responses) Oral Cavity - Dentition: Poor condition (lower no dentition) Oral Motor / Sensory Function: Impaired - see Bedside swallow  eval Self-Feeding Abilities: Able to feed self Patient Positioning: Upright in chair Baseline Vocal Quality: Clear Volitional Cough: Weak (nonproductive) Volitional Swallow: Able to elicit Anatomy:  (appearance of cervical osteophytes C4-C5, C5-C6) Pharyngeal Secretions: Standing secretions in (comment) (pharynx)     Reason for Referral Objectively evaluate swallowing function   Oral Phase Oral Preparation/Oral Phase Oral Phase: Impaired Oral - Nectar Oral - Nectar Teaspoon: Delayed oral transit;Reduced posterior  propulsion Oral - Nectar Cup: Delayed oral transit;Reduced posterior  propulsion Oral - Thin Oral - Thin Teaspoon: Delayed oral transit;Reduced posterior  propulsion Oral - Thin Cup: Delayed oral transit;Reduced posterior  propulsion Oral - Thin Straw: Delayed oral transit;Reduced posterior   propulsion Oral - Solids Oral - Puree: Delayed oral transit;Reduced posterior propulsion Oral - Regular: Impaired mastication;Delayed oral transit;Reduced  posterior propulsion   Pharyngeal Phase Pharyngeal - Nectar Pharyngeal - Nectar Teaspoon: Delayed swallow  initiation;Premature spillage to valleculae;Reduced pharyngeal  peristalsis;Reduced epiglottic inversion;Reduced anterior  laryngeal mobility;Reduced laryngeal elevation;Reduced  airway/laryngeal closure;Pharyngeal residue -  valleculae;Pharyngeal residue - pyriform sinuses;Reduced tongue  base retraction Pharyngeal - Nectar Cup: Delayed swallow initiation;Premature  spillage to valleculae;Penetration/Aspiration during  swallow;Pharyngeal residue - valleculae;Pharyngeal residue -  pyriform sinuses Penetration/Aspiration details (nectar cup): Material enters  airway, remains ABOVE vocal cords and not ejected out Pharyngeal - Thin Pharyngeal - Thin Teaspoon: Delayed swallow initiation;Premature  spillage to valleculae;Penetration/Aspiration during  swallow;Reduced epiglottic inversion;Reduced anterior laryngeal  mobility;Reduced laryngeal elevation;Reduced pharyngeal  peristalsis;Reduced airway/laryngeal closure;Reduced tongue base  retraction;Pharyngeal residue - pyriform sinuses;Pharyngeal  residue - valleculae Penetration/Aspiration details (thin teaspoon): Material enters  airway, remains ABOVE vocal cords and not ejected out Pharyngeal - Thin Cup: Delayed swallow initiation;Premature  spillage to valleculae;Reduced laryngeal elevation;Reduced  anterior laryngeal mobility;Reduced epiglottic inversion;Reduced  pharyngeal peristalsis;Reduced airway/laryngeal closure;Reduced  tongue base retraction;Penetration/Aspiration during  swallow;Trace aspiration;Pharyngeal residue - pyriform  sinuses;Pharyngeal residue - valleculae Penetration/Aspiration details (thin cup): Material enters  airway, passes BELOW cords without attempt by patient to eject  out (silent  aspiration) Pharyngeal - Thin Straw: Delayed swallow initiation;Premature  spillage to valleculae;Reduced pharyngeal peristalsis;Reduced  epiglottic inversion;Reduced anterior laryngeal mobility;Reduced  laryngeal elevation;Reduced airway/laryngeal closure;Reduced  tongue base retraction;Penetration/Aspiration during  swallow;Pharyngeal residue - cp segment;Pharyngeal residue -  posterior pharnyx Penetration/Aspiration details (thin straw): Material enters  airway, passes BELOW cords without attempt by patient to eject  out (silent aspiration) Pharyngeal - Solids Pharyngeal - Puree: Delayed swallow initiation;Premature spillage  to valleculae;Pharyngeal residue - pyriform sinuses;Reduced  anterior laryngeal mobility;Reduced epiglottic inversion;Reduced  laryngeal elevation;Reduced pharyngeal peristalsis;Reduced  airway/laryngeal closure;Reduced tongue base  retraction;Pharyngeal residue - valleculae Penetration/Aspiration details (puree): Material does not enter  airway Pharyngeal - Regular: Delayed swallow initiation;Premature  spillage to valleculae;Pharyngeal residue - valleculae;Reduced  pharyngeal peristalsis;Reduced epiglottic inversion;Reduced  anterior laryngeal mobility;Reduced laryngeal elevation;Reduced  airway/laryngeal closure;Reduced tongue base retraction  Cervical Esophageal Phase    GO    Cervical Esophageal Phase Cervical Esophageal Phase: Impaired Cervical Esophageal Phase - Nectar Nectar Teaspoon: Reduced cricopharyngeal relaxation Nectar Cup: Reduced cricopharyngeal relaxation;Esophageal  backflow into the pharynx Cervical Esophageal Phase - Thin Thin Teaspoon: Reduced cricopharyngeal relaxation Thin Cup: Reduced cricopharyngeal relaxation;Esophageal backflow  into the pharynx Thin Straw: Reduced cricopharyngeal relaxation Cervical Esophageal Phase - Solids Puree: Reduced cricopharyngeal relaxation Regular: Reduced cricopharyngeal relaxation Cervical Esophageal Phase - Comment Cervical  Esophageal Comment: pt appeared with mild stasis in  esophagus without awareness with frequent belching  and backflow        Luanna Salk, MS [facility] SLP [phone]     Microbiology: Recent Results (from the past 240 hour(s))  CULTURE, BLOOD (ROUTINE X 2)     Status: None   Collection Time    04/13/13 10:25 AM      Result Value Range Status   Specimen Description BLOOD RIGHT CHEST  5 ML IN [facility] BOTTLE   Final   Special Requests NONE   Final   Culture  Setup Time     Final   Value: 04/13/2013 17:49     Performed at [facility]   Culture     Final   Value:        BLOOD CULTURE RECEIVED NO GROWTH TO DATE CULTURE WILL BE HELD FOR 5 DAYS BEFORE ISSUING A FINAL NEGATIVE REPORT     Performed at [facility]   Report Status PENDING   Incomplete  CULTURE, BLOOD (ROUTINE X 2)     Status: None   Collection Time  04/13/13 12:39 PM      Result Value Range Status   Specimen Description BLOOD RIGHT FOREARM  4 ML IN [facility] BOTTLE   Final   Special Requests NONE   Final   Culture  Setup Time     Final   Value: 04/13/2013 17:48     Performed at [facility]   Culture     Final   Value:        BLOOD CULTURE RECEIVED NO GROWTH TO DATE CULTURE WILL BE HELD FOR 5 DAYS BEFORE ISSUING A FINAL NEGATIVE REPORT     Performed at [facility]   Report Status PENDING   Incomplete     Labs: Basic Metabolic Panel:  Recent Labs Lab 04/13/13 1025 04/14/13 0750 04/15/13 0619  NA 136* 139 140  K 4.1 3.1* 3.9  CL 94* 103 104  CO2 19 23 25   GLUCOSE 243* 117* 99  BUN 18 20 23   CREATININE 1.13 0.90 0.87  CALCIUM 10.5 9.0 9.3  MG  --  1.8  --    Liver Function Tests:  Recent Labs Lab 04/13/13 1025  AST 13  ALT 6  ALKPHOS 72  BILITOT 0.4  PROT 7.3  ALBUMIN 3.0*    Recent Labs Lab 04/13/13 1025  LIPASE 18   No results found for this basename: AMMONIA,  in the last 168 hours CBC:  Recent Labs Lab 04/13/13 1233 04/14/13 0750 04/15/13 0619  WBC 12.7* 9.0  9.6  NEUTROABS 12.0*  --   --   HGB 10.8* 9.3* 9.1*  HCT 31.4* 26.3* 26.9*  MCV 90.5 88.0 89.7  PLT 323 333 301   Cardiac Enzymes:  Recent Labs Lab 04/13/13 1025  TROPONINI <0.30   BNP: BNP (last 3 results) No results found for this basename: PROBNP,  in the last 8760 hours CBG:  Recent Labs Lab 04/16/13 0731 04/16/13 1215 04/16/13 1709 04/16/13 2153 04/17/13 0728  GLUCAP 94 141* 140* 160* 87       Signed:  Dia Crawford, MD [facility] [phone] pager

## 2013-04-17 NOTE — Clinical Social Work Placement (Signed)
     Clinical Social Work Department CLINICAL SOCIAL WORK PLACEMENT NOTE 04/17/2013  Patient:  Benjamin Moss, Benjamin Moss  Account Number:  [id number] Admit date:  04/13/2013  Clinical Social Worker:  Levie Heritage  Date/time:  04/14/2013 03:54 PM  Clinical Social Work is seeking post-discharge placement for this patient at the following level of care:   SKILLED NURSING   (*CSW will update this form in Epic as items are completed)   04/14/2013  Patient/family provided with [place] Department of Clinical Social Works list of facilities offering this level of care within the geographic area requested by the patient (or if unable, by the patients family).  04/14/2013  Patient/family informed of their freedom to choose among providers that offer the needed level of care, that participate in Medicare, Medicaid or managed care program needed by the patient, have an available bed and are willing to accept the patient.  04/14/2013  Patient/family informed of [facility] ownership interest in [facility], as well as of the fact that they are under no obligation to receive care at this facility.  PASARR submitted to EDS on 04/14/2013 PASARR number received from EDS on 04/14/2013  FL2 transmitted to all facilities in geographic area requested by pt/family on  04/14/2013 FL2 transmitted to all facilities within larger geographic area on   Patient informed that his/her managed care company has contracts with or will negotiate with  certain facilities, including the following:     Patient/family informed of bed offers received:  04/16/2013 Patient chooses bed at [facility] Physician recommends and patient chooses bed at    Patient to be transferred to [facility] on  04/17/2013 Patient to be transferred to facility by ptar  The following physician request were entered in Epic:   Additional Comments:

## 2013-04-19 LAB — CULTURE, BLOOD (ROUTINE X 2)
CULTURE: NO GROWTH
CULTURE: NO GROWTH

## 2013-04-23 ENCOUNTER — Telehealth: Payer: Self-pay | Admitting: *Deleted

## 2013-04-23 DIAGNOSIS — C3490 Malignant neoplasm of unspecified part of unspecified bronchus or lung: Secondary | ICD-10-CM

## 2013-04-23 NOTE — Telephone Encounter (Signed)
Juliann Pulse from [facility] called [phone]) stating that they are needing a doctor to sign home health care orders.  Per Dr Vista Mink, this needs to go his PCP.  Left msg with information.  Per Dr Vista Mink, pt needs f/u in 1-2 weeks.  SLJ

## 2013-04-24 ENCOUNTER — Telehealth: Payer: Self-pay | Admitting: Internal Medicine

## 2013-04-24 NOTE — Telephone Encounter (Signed)
lvm for pt regarding to Jan appt....mailed pt appt sched, avs and letter

## 2013-04-30 ENCOUNTER — Other Ambulatory Visit: Payer: Medicare HMO

## 2013-04-30 ENCOUNTER — Ambulatory Visit: Payer: Medicare HMO | Admitting: Internal Medicine

## 2013-04-30 NOTE — Telephone Encounter (Signed)
Received call from [place] at [facility] stating that she has been informed that Dr Vista Mink would be the PCP for pt's home health orders.  Pt is no longer with [facility]   Called back and informed her that per Dr Vista Mink, home health orders need to go to PCP.  Tiffany verbalized understanding.  Ph # [phone]

## 2013-05-02 ENCOUNTER — Telehealth: Payer: Self-pay | Admitting: Internal Medicine

## 2013-05-02 NOTE — Telephone Encounter (Signed)
pt call and needed to r/s missed appt....done...pt aware of new d.t

## 2013-05-07 ENCOUNTER — Other Ambulatory Visit (HOSPITAL_BASED_OUTPATIENT_CLINIC_OR_DEPARTMENT_OTHER): Payer: Medicare HMO

## 2013-05-07 ENCOUNTER — Inpatient Hospital Stay (HOSPITAL_COMMUNITY)
Admission: AD | Admit: 2013-05-07 | Discharge: 2013-05-08 | DRG: 180 | Disposition: A | Discharge status: Hospice | Payer: Medicare HMO | Source: Ambulatory Visit | Attending: Internal Medicine | Admitting: Internal Medicine

## 2013-05-07 ENCOUNTER — Encounter (HOSPITAL_COMMUNITY): Payer: Self-pay | Admitting: Internal Medicine

## 2013-05-07 ENCOUNTER — Ambulatory Visit (HOSPITAL_BASED_OUTPATIENT_CLINIC_OR_DEPARTMENT_OTHER): Payer: Commercial Managed Care - HMO | Admitting: Physician Assistant

## 2013-05-07 ENCOUNTER — Encounter: Payer: Self-pay | Admitting: Physician Assistant

## 2013-05-07 VITALS — BP 78/52 | HR 120 | Temp 97.8°F | Resp 18 | Ht 69.0 in | Wt 101.6 lb

## 2013-05-07 DIAGNOSIS — R1310 Dysphagia, unspecified: Secondary | ICD-10-CM | POA: Diagnosis present

## 2013-05-07 DIAGNOSIS — G9340 Encephalopathy, unspecified: Secondary | ICD-10-CM

## 2013-05-07 DIAGNOSIS — E860 Dehydration: Secondary | ICD-10-CM

## 2013-05-07 DIAGNOSIS — Z66 Do not resuscitate: Secondary | ICD-10-CM | POA: Diagnosis present

## 2013-05-07 DIAGNOSIS — D72829 Elevated white blood cell count, unspecified: Secondary | ICD-10-CM

## 2013-05-07 DIAGNOSIS — K6289 Other specified diseases of anus and rectum: Secondary | ICD-10-CM | POA: Diagnosis present

## 2013-05-07 DIAGNOSIS — K5900 Constipation, unspecified: Secondary | ICD-10-CM | POA: Diagnosis present

## 2013-05-07 DIAGNOSIS — R29898 Other symptoms and signs involving the musculoskeletal system: Secondary | ICD-10-CM | POA: Diagnosis present

## 2013-05-07 DIAGNOSIS — M542 Cervicalgia: Secondary | ICD-10-CM

## 2013-05-07 DIAGNOSIS — I69998 Other sequelae following unspecified cerebrovascular disease: Secondary | ICD-10-CM

## 2013-05-07 DIAGNOSIS — I10 Essential (primary) hypertension: Secondary | ICD-10-CM

## 2013-05-07 DIAGNOSIS — R634 Abnormal weight loss: Secondary | ICD-10-CM | POA: Diagnosis present

## 2013-05-07 DIAGNOSIS — T68XXXA Hypothermia, initial encounter: Secondary | ICD-10-CM

## 2013-05-07 DIAGNOSIS — R51 Headache: Secondary | ICD-10-CM | POA: Diagnosis present

## 2013-05-07 DIAGNOSIS — Z923 Personal history of irradiation: Secondary | ICD-10-CM

## 2013-05-07 DIAGNOSIS — C3490 Malignant neoplasm of unspecified part of unspecified bronchus or lung: Principal | ICD-10-CM

## 2013-05-07 DIAGNOSIS — F17200 Nicotine dependence, unspecified, uncomplicated: Secondary | ICD-10-CM

## 2013-05-07 DIAGNOSIS — I69992 Facial weakness following unspecified cerebrovascular disease: Secondary | ICD-10-CM

## 2013-05-07 DIAGNOSIS — I959 Hypotension, unspecified: Secondary | ICD-10-CM | POA: Diagnosis present

## 2013-05-07 DIAGNOSIS — I639 Cerebral infarction, unspecified: Secondary | ICD-10-CM

## 2013-05-07 DIAGNOSIS — R911 Solitary pulmonary nodule: Secondary | ICD-10-CM

## 2013-05-07 DIAGNOSIS — C3430 Malignant neoplasm of lower lobe, unspecified bronchus or lung: Secondary | ICD-10-CM

## 2013-05-07 DIAGNOSIS — C779 Secondary and unspecified malignant neoplasm of lymph node, unspecified: Secondary | ICD-10-CM | POA: Diagnosis present

## 2013-05-07 DIAGNOSIS — D649 Anemia, unspecified: Secondary | ICD-10-CM

## 2013-05-07 DIAGNOSIS — E785 Hyperlipidemia, unspecified: Secondary | ICD-10-CM

## 2013-05-07 DIAGNOSIS — R5381 Other malaise: Secondary | ICD-10-CM | POA: Diagnosis present

## 2013-05-07 DIAGNOSIS — E43 Unspecified severe protein-calorie malnutrition: Secondary | ICD-10-CM

## 2013-05-07 DIAGNOSIS — E7800 Pure hypercholesterolemia, unspecified: Secondary | ICD-10-CM

## 2013-05-07 DIAGNOSIS — R627 Adult failure to thrive: Secondary | ICD-10-CM | POA: Diagnosis present

## 2013-05-07 DIAGNOSIS — R0600 Dyspnea, unspecified: Secondary | ICD-10-CM

## 2013-05-07 DIAGNOSIS — R5383 Other fatigue: Secondary | ICD-10-CM

## 2013-05-07 DIAGNOSIS — Z681 Body mass index (BMI) 19 or less, adult: Secondary | ICD-10-CM

## 2013-05-07 DIAGNOSIS — R112 Nausea with vomiting, unspecified: Secondary | ICD-10-CM | POA: Diagnosis present

## 2013-05-07 DIAGNOSIS — C7800 Secondary malignant neoplasm of unspecified lung: Secondary | ICD-10-CM | POA: Diagnosis present

## 2013-05-07 DIAGNOSIS — R918 Other nonspecific abnormal finding of lung field: Secondary | ICD-10-CM

## 2013-05-07 DIAGNOSIS — J449 Chronic obstructive pulmonary disease, unspecified: Secondary | ICD-10-CM

## 2013-05-07 DIAGNOSIS — I69959 Hemiplegia and hemiparesis following unspecified cerebrovascular disease affecting unspecified side: Secondary | ICD-10-CM

## 2013-05-07 DIAGNOSIS — R52 Pain, unspecified: Secondary | ICD-10-CM

## 2013-05-07 DIAGNOSIS — J9600 Acute respiratory failure, unspecified whether with hypoxia or hypercapnia: Secondary | ICD-10-CM

## 2013-05-07 DIAGNOSIS — I69991 Dysphagia following unspecified cerebrovascular disease: Secondary | ICD-10-CM

## 2013-05-07 DIAGNOSIS — R0609 Other forms of dyspnea: Secondary | ICD-10-CM

## 2013-05-07 DIAGNOSIS — Z515 Encounter for palliative care: Secondary | ICD-10-CM

## 2013-05-07 DIAGNOSIS — R0989 Other specified symptoms and signs involving the circulatory and respiratory systems: Secondary | ICD-10-CM

## 2013-05-07 DIAGNOSIS — J9601 Acute respiratory failure with hypoxia: Secondary | ICD-10-CM

## 2013-05-07 DIAGNOSIS — E876 Hypokalemia: Secondary | ICD-10-CM

## 2013-05-07 DIAGNOSIS — J4489 Other specified chronic obstructive pulmonary disease: Secondary | ICD-10-CM

## 2013-05-07 LAB — COMPREHENSIVE METABOLIC PANEL (CC13)
ALT: 8 U/L (ref 0–55)
ANION GAP: 16 meq/L — AB (ref 3–11)
AST: 9 U/L (ref 5–34)
Albumin: 2.8 g/dL — ABNORMAL LOW (ref 3.5–5.0)
Alkaline Phosphatase: 73 U/L (ref 40–150)
BILIRUBIN TOTAL: 0.25 mg/dL (ref 0.20–1.20)
BUN: 22.6 mg/dL (ref 7.0–26.0)
CO2: 22 meq/L (ref 22–29)
Calcium: 12.2 mg/dL — ABNORMAL HIGH (ref 8.4–10.4)
Chloride: 101 mEq/L (ref 98–109)
Creatinine: 1.1 mg/dL (ref 0.7–1.3)
Glucose: 139 mg/dl (ref 70–140)
Potassium: 2.9 mEq/L — CL (ref 3.5–5.1)
SODIUM: 139 meq/L (ref 136–145)
TOTAL PROTEIN: 7.4 g/dL (ref 6.4–8.3)

## 2013-05-07 LAB — CBC WITH DIFFERENTIAL/PLATELET
BASO%: 1.6 % (ref 0.0–2.0)
Basophils Absolute: 0.1 10*3/uL (ref 0.0–0.1)
EOS ABS: 0.2 10*3/uL (ref 0.0–0.5)
EOS%: 3.1 % (ref 0.0–7.0)
HCT: 33.2 % — ABNORMAL LOW (ref 38.4–49.9)
HGB: 11.1 g/dL — ABNORMAL LOW (ref 13.0–17.1)
LYMPH%: 23.4 % (ref 14.0–49.0)
MCH: 30.1 pg (ref 27.2–33.4)
MCHC: 33.4 g/dL (ref 32.0–36.0)
MCV: 90 fL (ref 79.3–98.0)
MONO#: 0.7 10*3/uL (ref 0.1–0.9)
MONO%: 10.4 % (ref 0.0–14.0)
NEUT%: 61.5 % (ref 39.0–75.0)
NEUTROS ABS: 4.3 10*3/uL (ref 1.5–6.5)
NRBC: 0 % (ref 0–0)
Platelets: 355 10*3/uL (ref 140–400)
RBC: 3.69 10*6/uL — AB (ref 4.20–5.82)
RDW: 14.8 % — ABNORMAL HIGH (ref 11.0–14.6)
WBC: 7 10*3/uL (ref 4.0–10.3)
lymph#: 1.6 10*3/uL (ref 0.9–3.3)

## 2013-05-07 LAB — TECHNOLOGIST REVIEW: Technologist Review: ADEQUATE

## 2013-05-07 MED ORDER — GUAIFENESIN ER 600 MG PO TB12
600.0000 mg | ORAL_TABLET | Freq: Two times a day (BID) | ORAL | Status: DC
  Filled 2013-05-07: qty 1

## 2013-05-07 MED ORDER — LORAZEPAM 1 MG PO TABS: 1.0000 mg | ORAL_TABLET | ORAL | Status: DC | PRN

## 2013-05-07 MED ORDER — ACETAMINOPHEN 325 MG PO TABS: 650.0000 mg | ORAL_TABLET | Freq: Four times a day (QID) | ORAL | Status: DC | PRN

## 2013-05-07 MED ORDER — FLEET ENEMA 7-19 GM/118ML RE ENEM: 1.0000 | ENEMA | Freq: Once | RECTAL | Status: AC | PRN

## 2013-05-07 MED ORDER — ONDANSETRON HCL 4 MG/2ML IJ SOLN: 4.0000 mg | Freq: Four times a day (QID) | INTRAMUSCULAR | Status: DC | PRN

## 2013-05-07 MED ORDER — MORPHINE SULFATE (CONCENTRATE) 10 MG /0.5 ML PO SOLN
10.0000 mg | ORAL | Status: DC | PRN
  Administered 2013-05-07: 20 mg via ORAL
  Filled 2013-05-07: qty 1

## 2013-05-07 MED ORDER — HYDROMORPHONE HCL PF 1 MG/ML IJ SOLN: 0.5000 mg | INTRAMUSCULAR | Status: DC | PRN

## 2013-05-07 MED ORDER — SODIUM CHLORIDE 0.9 % IJ SOLN
INTRAMUSCULAR | Status: AC
  Administered 2013-05-07: 10 mL
  Filled 2013-05-07: qty 10

## 2013-05-07 MED ORDER — LORAZEPAM 2 MG/ML IJ SOLN: 1.0000 mg | INTRAMUSCULAR | Status: DC | PRN

## 2013-05-07 MED ORDER — NICOTINE 21 MG/24HR TD PT24
21.0000 mg | MEDICATED_PATCH | Freq: Every day | TRANSDERMAL | Status: DC
  Administered 2013-05-07 – 2013-05-08 (×2): 21 mg via TRANSDERMAL
  Filled 2013-05-07 (×2): qty 1

## 2013-05-07 MED ORDER — POTASSIUM CHLORIDE IN NACL 40-0.9 MEQ/L-% IV SOLN
INTRAVENOUS | Status: DC
  Administered 2013-05-07: 20 mL/h via INTRAVENOUS
  Filled 2013-05-07 (×2): qty 1000

## 2013-05-07 MED ORDER — HYDROCODONE-ACETAMINOPHEN 5-325 MG PO TABS: 1.0000 | ORAL_TABLET | Freq: Four times a day (QID) | ORAL | Status: DC | PRN

## 2013-05-07 MED ORDER — POLYETHYLENE GLYCOL 3350 17 G PO PACK
17.0000 g | PACK | Freq: Every day | ORAL | Status: DC | PRN
  Filled 2013-05-07: qty 1

## 2013-05-07 MED ORDER — ALTEPLASE 2 MG IJ SOLR
2.0000 mg | Freq: Once | INTRAMUSCULAR | Status: AC
  Administered 2013-05-07: 2 mg
  Filled 2013-05-07: qty 2

## 2013-05-07 MED ORDER — IPRATROPIUM-ALBUTEROL 0.5-2.5 (3) MG/3ML IN SOLN
3.0000 mL | Freq: Three times a day (TID) | RESPIRATORY_TRACT | Status: DC
  Administered 2013-05-07 – 2013-05-08 (×4): 3 mL via RESPIRATORY_TRACT
  Filled 2013-05-07 (×4): qty 3

## 2013-05-07 MED ORDER — SODIUM CHLORIDE 0.9 % IV BOLUS (SEPSIS)
1000.0000 mL | Freq: Once | INTRAVENOUS | Status: AC
  Administered 2013-05-07: 1000 mL via INTRAVENOUS

## 2013-05-07 MED ORDER — ACETAMINOPHEN 650 MG RE SUPP: 650.0000 mg | Freq: Four times a day (QID) | RECTAL | Status: DC | PRN

## 2013-05-07 MED ORDER — IBUPROFEN 800 MG PO TABS
400.0000 mg | ORAL_TABLET | ORAL | Status: DC | PRN
  Administered 2013-05-08: 400 mg via ORAL
  Filled 2013-05-07: qty 1

## 2013-05-07 MED ORDER — ENSURE COMPLETE PO LIQD
237.0000 mL | Freq: Three times a day (TID) | ORAL | Status: DC
  Administered 2013-05-08 (×2): 237 mL via ORAL

## 2013-05-07 MED ORDER — SENNA 8.6 MG PO TABS
2.0000 | ORAL_TABLET | Freq: Every day | ORAL | Status: DC
  Administered 2013-05-07: 17.2 mg via ORAL
  Filled 2013-05-07: qty 2

## 2013-05-07 MED ORDER — POTASSIUM CHLORIDE 10 MEQ/100ML IV SOLN
10.0000 meq | INTRAVENOUS | Status: DC
  Filled 2013-05-07 (×4): qty 100

## 2013-05-07 MED ORDER — DOCUSATE SODIUM 100 MG PO CAPS
200.0000 mg | ORAL_CAPSULE | Freq: Two times a day (BID) | ORAL | Status: DC
  Administered 2013-05-07 – 2013-05-08 (×3): 200 mg via ORAL
  Filled 2013-05-07 (×4): qty 2

## 2013-05-07 MED ORDER — PROMETHAZINE HCL 25 MG/ML IJ SOLN: 12.5000 mg | Freq: Four times a day (QID) | INTRAMUSCULAR | Status: DC | PRN

## 2013-05-07 MED ORDER — ONDANSETRON HCL 4 MG PO TABS: 4.0000 mg | ORAL_TABLET | Freq: Four times a day (QID) | ORAL | Status: DC | PRN

## 2013-05-07 MED ORDER — MAGNESIUM SULFATE 40 MG/ML IJ SOLN
2.0000 g | Freq: Once | INTRAMUSCULAR | Status: DC
  Filled 2013-05-07: qty 50

## 2013-05-07 MED ORDER — MORPHINE SULFATE 2 MG/ML IJ SOLN: 1.0000 mg | INTRAMUSCULAR | Status: DC | PRN

## 2013-05-07 MED ORDER — BISACODYL 10 MG RE SUPP: 10.0000 mg | Freq: Every day | RECTAL | Status: DC | PRN

## 2013-05-07 NOTE — Progress Notes (Signed)
tPA removed from PICC line x 2 ports.  GBR from each port, flushed with 10cc NS in each port and a new cap was placed.  Benjamin Moss   

## 2013-05-07 NOTE — Progress Notes (Signed)
OT Cancellation Note  Patient Details Name: Benjamin Moss MRN: [id number] DOB: [birth year]   Cancelled Treatment:    Reason Eval/Treat Not Completed: Patient not medically ready (K+ 2.9 today)  Yaminah Clayborn A 05/07/2013, 4:29 PM

## 2013-05-07 NOTE — Progress Notes (Addendum)
INITIAL NUTRITION ASSESSMENT  DOCUMENTATION CODES Per approved criteria  -Underweight -Severe malnutrition in the context of chronic illness  Pt meets criteria for severe MALNUTRITION in the context of chronic illness as evidenced by 10% weight loss in one month, <75% PO intake for > one month, and severe muscle wasting and subcutaneous body fat loss in all body regions.   INTERVENTION: -Recommend Ensure Complete po TID, each supplement provides 350 kcal and 13 grams of protein -Encouraged PO intake -Provided pt with Ensure coupons for d/c  NUTRITION DIAGNOSIS: Inadequate oral intake related to nausea/vomiting as evidenced by PO intake < 75%, wt loss.   Goal: Pt to meet >/= 90% of their estimated nutrition needs    Monitor:  Total protein/energy intake, labs, weights, GI profile  Reason for Assessment: MST/Underweight BMI  68 y.o. male  Admitting Dx: <principal problem not specified>  ASSESSMENT: The patient is a 68 y.o. year-old male with history of metastatic squamous cell lung carcinoma followed by Dr. 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 The patient was admitted from 04/13/13 through 04/17/13 due to SOB and he was treated with doxycycline and bronchodilators and discharged to SNF on O2. He was discharged to home from SNF soon thereafter where he lives alone. He states he gets around with a cane and is responsible for shopping and fixing his meals. He states that since arriving home, he has had post-meal nausea and vomiting, nonbilious, nonbloody. He has had constipation with pellet-like stools. He has cough, nonproductive and without hemoptysis, no obvious wheeze, + SOB previously with walking but currently while at rest  -Diet recall indicates pt  consumes "oodles of noodles" for breakfast with the occasional sausage/egg, and chicken or pork for afternoon or dinner meal. Pt prepares meals for self -Was receiving Ensure during previous admit in 04/15/2013. Was unable to continually purchase supplement d/t financial reasons. Will provide pt with coupons to assist in supplements affordability upon d/c -Enjoys strawberry Ensure. Will add Ensure TID -Reported feelings of nausea, vomiting and constipation -MD noted pt to receive Colace and Senna for constipation, appetite will likely improve once pt able to have regular BMs -Pt with 12 lbs wt loss in less than one month  -PO intake approximately 50%. SLP evaluated pt during previous admission and recommend Dysphagia 3/thin. Tolerating diet texture. Denied any nausea/vomiting -Admitted with dehydration -Not chemo candidate per MD  Nutrition Focused Physical Exam:  Subcutaneous Fat:  Orbital Region: severe Upper Arm Region: severe Thoracic and Lumbar Region: severe  Muscle:  Temple Region: severe Clavicle Bone Region: severe Clavicle and Acromion Bone Region: severe Scapular Bone Region: severe Dorsal Hand: severe Patellar Region: severe Anterior Thigh Region: severe Posterior Calf Region: severe  Edema: N/A    Height: Ht Readings from Last 1 Encounters:  05/07/13 5\' 9"  (1.753 m)    Weight: Wt Readings from Last 1 Encounters:  05/07/13 100 lb 12 oz (45.7 kg)    Ideal Body Weight: 160 lbs  % Ideal Body Weight: 63%  Wt Readings from Last 10 Encounters:  05/07/13 100 lb 12 oz (45.7 kg)  05/07/13 101 lb 9.6 oz (46.085 kg)  04/17/13 112 lb 7 oz (51 kg)  07/19/12 116 lb 4.8 oz (52.753 kg)  06/07/12 116 lb 1.6 oz (52.663 kg)  05/10/12 118 lb 9.6 oz (53.797 kg)  05/08/12 116 lb 8 oz (52.844 kg)  05/01/12 120 lb 1.6 oz (54.477 kg)  04/30/12 115 lb 9.6 oz (52.436 kg)  04/24/12 119 lb 12.8 oz (54.341 kg)    Usual Body Weight: 115-120 lbs per previous medical records  %  Usual Body Weight: 87%  BMI:  Body mass index is 14.87 kg/(m^2). Underweight  Estimated Nutritional Needs: Kcal: 1400-1600 Protein: 80-90 gram Fluid: 1600 ml/daily  Skin: WDl  Diet Order: Dysphagia 3  EDUCATION NEEDS: -No education needs identified at this time  No intake or output data in the 24 hours ending 05/07/13 1553  Last BM: None during admit   Labs:   Recent Labs Lab 05/07/13 1019  NA 139  K 2.9*  CO2 22  BUN 22.6  CREATININE 1.1  CALCIUM 12.2*  GLUCOSE 139    CBG (last 3)  No results found for this basename: GLUCAP,  in the last 72 hours  Scheduled Meds: . docusate sodium  200 mg Oral BID  . feeding supplement (ENSURE COMPLETE)  237 mL Oral TID BM  . guaiFENesin  600 mg Oral BID  . ipratropium-albuterol  3 mL Nebulization TID  . magnesium sulfate 1 - 4 g bolus IVPB  2 g Intravenous Once  . nicotine  21 mg Transdermal Daily  . potassium chloride  10 mEq Intravenous Q1 Hr x 4  . senna  2 tablet Oral QHS  . sodium chloride  1,000 mL Intravenous Once    Continuous Infusions: . 0.9 % NaCl with KCl 40 mEq / L      Past Medical History  Diagnosis Date  . Hyperlipidemia   . Hypertension   . COPD (chronic obstructive pulmonary disease)   . Stroke     left side weakness, ambulates with cane/ walker  . Right lower lobe lung mass   . Hypoxia   . Nodule of left lung   . Hx of radiation therapy 02/07/12-05/10/12    R central chest hilar,mediastinal region 63 gray, 35 fx  . Lung cancer 01/19/2012    Past Surgical History  Procedure Laterality Date  . Tonsillectomy      [place] Clinical Dietitian [phone]

## 2013-05-07 NOTE — H&P (Addendum)
[facility] History and Physical  Benjamin Moss YDX:[id number] DOB: 1946/03/15 DOA: 05/07/2013  Referring physician:  Curt Moss PCP:  Benjamin Jewel, MD   Chief Complaint:  Weakness, nausea  HPI:  The patient is a 68 y.o. 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  The patient was admitted from 04/13/13 through 04/17/13 due to SOB and he was treated with doxycycline and bronchodilators and discharged to SNF on O2.  He was discharged to home from SNF soon thereafter where he lives alone.  He states he gets around with a cane and is responsible for shopping and fixing his meals.  He states that since arriving home, he has had post-meal nausea and vomiting, nonbilious, nonbloody.  He has had constipation with pellet-like stools.  He has cough, nonproductive and without hemoptysis, no obvious wheeze, + SOB previously with walking but currently while at rest.  He denies fevers, but is always cold.  Denies dysuria or other symptoms of acute infection.    He presented to Oncology today for routine follow up.  He has lost 12.5-lbs since he was discharged, currently 45.7kg, VS in clinic BP 78/52, HR 120, 98% on RA,  Labs notable for potassium 2.9, BUN 23, creatinine 1.1, calcium 12.2, hemoglobin 11.1.  Benjamin Moss spoke with patient about his SOB, deconditioning, electrolyte disturbance.  Due to his decreased functional status, he is no longer a candidate for chemotherapy and is a good inpatient hospice candidate.  Benjamin Moss and she discussed his poor prognosis with him and recommended palliative care consultation and inpatient hospice referral which he was amenable to.  He is being admitted to  [facility] for dehydration and electrolyte abnormality and hospice placement.    Review of Systems:  General:  Chronic chills, + weight loss or gain, + headache HEENT:  Denies changes to hearing and vision, rhinorrhea, sinus congestion, sore throat CV:  Denies chest pain and palpitations, lower extremity edema.  PULM:  + SOB, cough.  Denies wheezing GI:  Per HPI GU:  Denies dysuria, frequency, urgency ENDO:  Denies polyuria, polydipsia.   HEME:  Denies hematemesis, blood in stools, melena, abnormal bruising or bleeding.  LYMPH:  Denies lymphadenopathy.   MSK:  Denies arthralgias, myalgias.   DERM:  Denies skin rash or ulcer.   NEURO:  Chronic left-sided weakness including facial droop since stroke, denies confusion PSYCH:  Denies anxiety and depression.    Past Medical History  Diagnosis Date  . Hyperlipidemia   . Hypertension   . COPD (chronic obstructive pulmonary disease)   . Stroke     left side weakness, ambulates with cane/ walker  . Right lower lobe lung mass   . Hypoxia   . Nodule of left lung   . Hx of radiation therapy 02/07/12-05/10/12    R central chest hilar,mediastinal region 63 gray, 35 fx  . Lung cancer 01/19/2012   Past Surgical History  Procedure Laterality Date  . Tonsillectomy     Social History:  reports that he has been smoking Cigarettes.  He has a 100 pack-year smoking history. He has never used smokeless tobacco. He reports that he does not drink alcohol or use illicit drugs. Discharged from [facility] three weeks ago.  Lives at home alone, ambulates with  cane.  Has one sister who is paralyzed and unable to make medical decisions for him.  He has no other living family members.  He asks that his neighbor, Benjamin Moss, make medical decisions on his behalf, although Benjamin Moss does not have HPOA.    No Known Allergies  Family History  Problem Relation Age of Onset  . Cancer Neg Hx   . Alcoholism Father      Prior to Admission medications    Medication Sig Start Date End Date Taking? Authorizing Provider  albuterol (PROVENTIL HFA;VENTOLIN HFA) 108 (90 BASE) MCG/ACT inhaler Inhale 2 puffs into the lungs every 6 (six) hours as needed for wheezing. 06/07/12  Yes Benjamin Promise, MD  amLODipine (NORVASC) 10 MG tablet Take 10 mg by mouth daily.   Yes Historical Provider, MD  clopidogrel (PLAVIX) 75 MG tablet Take 75 mg by mouth daily.   Yes Historical Provider, MD  feeding supplement, ENSURE COMPLETE, (ENSURE COMPLETE) LIQD Take 237 mLs by mouth 3 (three) times daily between meals. 04/17/13  Yes Benjamin Bossier, MD  guaiFENesin (MUCINEX) 600 MG 12 hr tablet Take 1 tablet (600 mg total) by mouth 2 (two) times daily. 04/17/13  Yes Benjamin Bossier, MD  hydrochlorothiazide (HYDRODIURIL) 25 MG tablet Take 25 mg by mouth daily.   Yes Historical Provider, MD  HYDROcodone-acetaminophen (NORCO/VICODIN) 5-325 MG per tablet Take 1-2 tablets by mouth every 6 (six) hours as needed for moderate pain or severe pain. 04/17/13  Yes Benjamin Bossier, MD  nicotine (NICODERM CQ - DOSED IN MG/24 HOURS) 21 mg/24hr patch Place 1 patch (21 mg total) onto the skin daily. 04/17/13  Yes Benjamin Bossier, MD   Physical Exam: Filed Vitals:   05/07/13 1253  BP: 95/71  Pulse: 93  Temp: 98.1 F (36.7 C)  TempSrc: Oral  Resp: 16  Height: 5\' 9"  (1.753 m)  Weight: 45.7 kg (100 lb 12 oz)  SpO2: 94%     General:  Cachectic [name], mild tachypnea at rest  Eyes:  PERRL, anicteric, non-injected.  ENT:  Nares clear.  OP clear, non-erythematous without plaques or exudates.  MMM.  Poor dentition  Neck:  Supple without TM or JVD.    Lymph:  No cervical, supraclavicular, or submandibular LAD.  Cardiovascular:  RRR, normal S1, S2, without m/r/g.  2+ pulses, warm extremities  Respiratory:  Rhonchorous, + wheezes, no focal rales, tachypnea without accessory muscle use.    Abdomen:  NABS.  Soft, ND/NT.  scaphoid  Skin:  No rashes or focal lesions.  Musculoskeletal:  Normal bulk  and tone.  No LE edema.  Psychiatric:  A & O x 4.  Appropriate affect.  Neurologic:  Left facial droop, 3/5 strength left hand, 4/5 left arm and leg, right side 5/5 strength.  Sensation intact.  Labs on Admission:  Basic Metabolic Panel:  Recent Labs Lab 05/07/13 1019  Moss 139  K 2.9*  CO2 22  GLUCOSE 139  BUN 22.6  CREATININE 1.1  CALCIUM 12.2*   Liver Function Tests:  Recent Labs Lab 05/07/13 1019  AST 9  ALT 8  ALKPHOS 73  BILITOT 0.25  PROT 7.4  ALBUMIN 2.8*   No results found for this basename: LIPASE, AMYLASE,  in the last 168 hours No results found for this basename: AMMONIA,  in the last 168 hours CBC:  Recent Labs Lab 05/07/13 1019  WBC 7.0  NEUTROABS 4.3  HGB 11.1*  HCT 33.2*  MCV 90.0  PLT 355  Cardiac Enzymes: No results found for this basename: CKTOTAL, CKMB, CKMBINDEX, TROPONINI,  in the last 168 hours  BNP (last 3 results) No results found for this basename: PROBNP,  in the last 8760 hours CBG: No results found for this basename: GLUCAP,  in the last 168 hours  Radiological Exams on Admission: No results found.   Assessment/Plan Active Problems:   TOBACCO ABUSE   DYSPHAGIA DUE TO CEREBROVASCULAR DISEASE   COPD   Anemia   Dehydration   Metastatic primary lung cancer   Hypokalemia  ---  Mr. Mustard is a 68 yo M with progressive metastatic lung cancer with declining functional status.  This is his second admission in less than a month with dehydration and SOB.  I spoke with him about goals of care.  No family was present, however, RN Josph Macho witnessed conversation.  I verified that the patient is amenable to hospice care.  He voiced understanding that hospice care is provided to people who have less than a 6 month life expectancy and that he qualifies because his cancer is progressing.  He understands he is no longer a candidate for chemotherapy. He understands that the goal of hospice care is to provide comfort at end-of-life and that we  try to avoid painful or invasive procedures when possible.  He voiced understanding and agreed with no CPR, chest compressions/shocks, mechanical ventilation.  He is DNR.  I advised Mr. Berns to discuss the change in his goals of care when he next speaks with Benjamin Moss, his neighbor whom he has appointed to make medical decisions on his behalf.   -  Appreciate Oncology assistance -  SW for inpatient hospice referral -  Palliative care consult for symptom management (nausea)  Dehydration, likely secondary to nausea and vomiting and ongoing diuretic use -  D/c HCTZ -  Start IVF  Nausea and vomiting, unclear etiology.  May be related to constipation, impending bowel obstruction from cancer -  Zofran and phenergan prn -  Consider reglan with meals if zofran/phenergan not working -  Liver function tests normal -  No epigastric pain to suggest pancreatitis -  No dysuria to suggest UTI  Hypokalemia, likely due to dehydration and HCTZ use -  D/c HCTZ -  IV potassium and IV KCl in IVF -  Magnesium 2gm IV once -  Add on magnesium and phos  Constipation -  Start colace and senna -  miralax prn -  Bisacodyl and enemas prn  Rectal pain, likely related to constipation -  tx constipation  Headache -  Tylenol and ibuprofen prn  SOB, likely secondary to COPD and metastatic cancer.  No clinical evidence of pneumonia.   -   Start duonebs -   Add prn morphine (sol'n because of nausea/difficulty tolerating PO) -  Continue guaifenesin  HTN now with hypotension due to dehydration and progressive cancer -  Stop norvasc and HCTZ -  IVF  Stroke with left hemiparesis and persistent dysphagia.  Seen by SLP during last admission -  D/c plavix -  Dysphagia 3 with thin liquids  Severe protein-calorie malnutrition -  Palliative diet and supplements as tolerated  Cigarette/tobacco use, quit 3-4 weeks ago -  Continue nicotine patch  Diet:  Dysphagia 3 with thin Access:  port IVF:   yes Proph:  SCD  Code Status: DNR Family Communication: patient alone Disposition Plan: Admit to med-surg  Time spent: 60 min Janece Canterbury [facility] Pager [phone]  If 7PM-7AM, please contact night-coverage [email or web address] Password  [email or web address] 05/07/2013, 2:18 PM

## 2013-05-07 NOTE — Progress Notes (Addendum)
[place] [phone]   [phone]  OFFICE PROGRESS NOTE  No primary provider on file. No primary provider on file.  DIAGNOSIS: non-small cell lung cancer, squamous cell carcinoma presenting with synchronous tumor as stage IIIA (T2a., N2, M0) in the right lung and questionably stage IA (T1a., N0, M0) in the left lung based on the imaging studies also the final pathology did not show metastatic squamous cell carcinoma in the mediastinal lymph nodes or the left lower lobe mass   PRIOR THERAPY: Concurrent chemoradiation with weekly carboplatin for AUC of 2 and paclitaxel 45 mg/M2, last dose was given 04/30/2012.    CURRENT THERAPY: Observation.  INTERVAL HISTORY: Benjamin Moss 68 y.o. male returns to the clinic today for followup visit. He had been recently hospitalized and was in a short term nursing facility briefly. Patient states that he left the facility early as he was unhappy with his treatment. He presents today for further evaluation. Recent CT angiography of the chest revealed marked progression of metastatic lung carcinoma within the chest was significant enlargement in the primary right lower lobe mass as well as innumerable metastatic nodules throughout both lungs. There is some question of lymphangitic spread of tumor in the right lower lobe. He complains of feeling cold. He is at home alone and self reports that he is able to bathe dress and cook for himself however also admits that he does not always perform these tasks. He has a friend that checks in on him about once a week bringing him food and helping to straighten his home, and he also states that he has a nurse that comes in once a month. He initially he voiced interested in some sort of treatment for his progressive disease however now is feeling too weak to proceed with any treatment. His by mouth intake is poor both of food and fluids. He has lost approximately 19 pounds since 05/01/2012.  He is down 11 pounds from 04/17/2013. He complains of feeling cold.  MEDICAL HISTORY: Past Medical History  Diagnosis Date  . Hyperlipidemia   . Hypertension   . COPD (chronic obstructive pulmonary disease)   . Stroke     left side weakness, ambulates with cane/ walker  . Right lower lobe lung mass   . Hypoxia   . Nodule of left lung   . Hx of radiation therapy 02/07/12-05/10/12    R central chest hilar,mediastinal region 63 gray, 35 fx  . Lung cancer 01/19/2012    ALLERGIES:  has No Known Allergies.  MEDICATIONS:  No current outpatient prescriptions on file.   No current facility-administered medications for this visit.    SURGICAL HISTORY:  Past Surgical History  Procedure Laterality Date  . Tonsillectomy      REVIEW OF SYSTEMS:  A comprehensive review of systems was negative except for: Constitutional: positive for anorexia, fatigue and weight loss Respiratory: positive for dyspnea on exertion generalized weakness   PHYSICAL EXAMINATION: General appearance: alert, cooperative, appears older than stated age, cachectic, fatigued and no distress Head: Normocephalic, without obvious abnormality, atraumatic Neck: no adenopathy Resp: clear to auscultation bilaterally Cardio: regular rate and rhythm, S1, S2 normal, no murmur, click, rub or gallop GI: soft, non-tender; bowel sounds normal; no masses,  no organomegaly Extremities: extremities normal, atraumatic, no cyanosis or edema Neurologic: Grossly normal  ECOG PERFORMANCE STATUS: 2 - Symptomatic, <50% confined to bed  Blood pressure 78/52, pulse 120, temperature 97.8 F (36.6 C), temperature source Oral,  resp. rate 18, height 5\' 9"  (1.753 m), weight 101 lb 9.6 oz (46.085 kg), SpO2 98.00%.  LABORATORY DATA: Lab Results  Component Value Date   WBC 7.0 05/07/2013   HGB 11.1* 05/07/2013   HCT 33.2* 05/07/2013   MCV 90.0 05/07/2013   PLT 355 05/07/2013      Chemistry      Component Value Date/Time   NA 139 05/07/2013 1019    NA 140 04/15/2013 0619   K 2.9* 05/07/2013 1019   K 3.9 04/15/2013 0619   CL 104 04/15/2013 0619   CL 101 09/18/2012 1040   CO2 22 05/07/2013 1019   CO2 25 04/15/2013 0619   BUN 22.6 05/07/2013 1019   BUN 23 04/15/2013 0619   CREATININE 1.1 05/07/2013 1019   CREATININE 0.87 04/15/2013 0619      Component Value Date/Time   CALCIUM 12.2* 05/07/2013 1019   CALCIUM 9.3 04/15/2013 0619   ALKPHOS 73 05/07/2013 1019   ALKPHOS 72 04/13/2013 1025   AST 9 05/07/2013 1019   AST 13 04/13/2013 1025   ALT 8 05/07/2013 1019   ALT 6 04/13/2013 1025   BILITOT 0.25 05/07/2013 1019   BILITOT 0.4 04/13/2013 1025       RADIOGRAPHIC STUDIES: Ct Head W Wo Contrast  04/14/2013   CLINICAL DATA:  68 year old male with left hemiparesis and headache. History of lung cancer. Marland Kitchen  EXAM: CT HEAD WITHOUT AND WITH CONTRAST  TECHNIQUE: Contiguous axial images were obtained from the base of the skull through the vertex without and with intravenous contrast  CONTRAST:  151mL OMNIPAQUE IOHEXOL 300 MG/ML  SOLN  COMPARISON:  01/09/2012 MRI and 02/08/2010 CT  FINDINGS: Remote right basal ganglia and right frontoparietal infarcts again noted.  Mild chronic small-vessel white matter ischemic changes are again identified.  No acute intracranial abnormalities are identified, including abnormal enhancement, mass lesion or mass effect, hydrocephalus, extra-axial fluid collection, midline shift, hemorrhage, or acute infarction. The visualized bony calvarium is unremarkable.  IMPRESSION: No evidence of acute intracranial abnormality or metastatic disease.  Remote right side infarcts and chronic small-vessel white matter ischemic changes.   Electronically Signed   By: Hassan Rowan M.D.   On: 04/14/2013 13:52   Ct Angio Chest Pe W/cm &/or Wo Cm  04/13/2013   CLINICAL DATA:  Shortness of breath and history of lung carcinoma.  EXAM: CT ANGIOGRAPHY CHEST WITH CONTRAST  TECHNIQUE: Multidetector CT imaging of the chest was performed using the standard protocol during bolus  administration of intravenous contrast. Multiplanar CT image reconstructions including MIPs were obtained to evaluate the vascular anatomy.  CONTRAST:  181mL OMNIPAQUE IOHEXOL 350 MG/ML SOLN  COMPARISON:  Multiple prior CT exams of the chest. The latest is dated 09/18/2012.  FINDINGS: The pulmonary arteries are well opacified at the time of the examination. There is no evidence of pulmonary embolism.  There is significant progression of lung carcinoma and metastatic disease in the chest. The primary mass centered in the superior segment of the right lower lobe shows significant interval enlargement with maximal transverse dimensions of approximately 5.7 x 6.4 cm. Adjacent parenchymal changes likely reflect post radiation therapy changes. There may be lymphangitic spread of tumor in the right lower lobe, as well, which is strongly suspected given the multitude of other metastatic lesions identified.  Innumerable new metastatic nodules are identified throughout all lobes of both lungs. Largest measured nodules are a 1.2 cm nodule in the right middle lobe, 1.3 cm nodule at the posterior basilar right lower  lobe and 1.3 cm nodule in the left lower lobe. There are too many new metastatic nodules to count.  Progressive lymphadenopathy is also identified in the right hilum and mediastinum. Largest right hilar lymph node measures approximately 2.3 x 3.0 cm. Subcarinal lymphadenopathy measures 1.8 cm in short axis.  There is a trace amount of pleural fluid on the right. No pericardial fluid is identified. The heart size is normal. No bony lesions are identified.  Review of the MIP images confirms the above findings.  IMPRESSION: Marked progression of metastatic lung carcinoma in the chest with significant enlargement of the primary right lower lobe mass as well as innumerable new metastatic nodules throughout both lungs. There may also be lymphangitic spread of tumor in the right lower lobe.   Electronically Signed   By:  Aletta Edouard M.D.   On: 04/13/2013 13:05   Dg Chest Port 1 View  04/13/2013   CLINICAL DATA:  Shortness of breath.  Lung cancer.  EXAM: PORTABLE CHEST - 1 VIEW  COMPARISON:  Chest CT 09/18/2012.  FINDINGS: The a power port is in good position. The cardiac silhouette, mediastinal and hilar contours are stable. There is a large right hilar mass and right hilar adenopathy. Underlying advanced emphysematous changes with increased interstitial markings in both lungs, right greater than left which could reflect asymmetric edema. Could not exclude the possibly of interstitial spread of tumor on the right. Suspect small pulmonary nodules No definite pleural effusion. The bony thorax is grossly intact.  IMPRESSION: Enlarging right hilar mass and hilar adenopathy.  Increased interstitial markings in both lungs could reflect asymmetric noncardiogenic pulmonary edema. Could not exclude the possibility of interstitial spread of tumor in the right lung.  Underlying emphysematous changes.  Suspect pulmonary metastasis.   Electronically Signed   By: Kalman Jewels M.D.   On: 04/13/2013 11:07   Dg Swallowing Func-speech Pathology  04/15/2013   Macario Golds, CCC-SLP     04/15/2013  1:53 PM Objective Swallowing Evaluation: Modified Barium Swallowing Study   Patient Details  Name: Benjamin Moss MRN: [id number] Date of Birth: March 09, 1946  Today's Date: 04/15/2013 Time: 1240-1315 SLP Time Calculation (min): 35 min  Past Medical History:  Past Medical History  Diagnosis Date  . Hyperlipidemia   . Hypertension   . COPD (chronic obstructive pulmonary disease)   . Stroke     left side weakness, ambulates with cane/ walker  . Right lower lobe lung mass   . Hypoxia   . Nodule of left lung   . Hx of radiation therapy 02/07/12-05/10/12    R central chest hilar,mediastinal region 63 gray, 35 fx  . Lung cancer 01/19/2012   Past Surgical History:  Past Surgical History  Procedure Laterality Date  . 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  PMH + for cva with  dysphagia requiring modified diet (dys3/honey per Nov 2011 MBS),  pt states he stopped using thickener when he "ran out of money".   CT head recently showed remote right sided infarcts.  Pt admits  to occasionally becoming "choked" on food more than drink.       Assessment / Plan / Recommendation Clinical Impression  Dysphagia Diagnosis: Moderate oral phase dysphagia;Moderate  pharyngeal phase dysphagia;Moderate cervical esophageal phase  dysphagia   Clinical impression: Moderate oropharyngeal and cervical  esophageal dysphagia with sensorimotor deficits- likely from CVA  with exacerbation from acute illness.  Delayed oral transiting,  decreased oral propulsion due to weakness noted.  Pharyngeal  swallow characterized by delayed swallow initiation (up to 4  seconds at pyriform), decreased laryngeal elevation/closure  contributing to stasis/laryngeal penetration.  Pt independently  conducted extended breath hold with MULTIPLE dry swallows aiding  pharyngeal clearance.  This maneuever is tiring for pt but  effective.  Cued throat clear/cough removed majority of laryngeal  penetrates/aspirates.  Chin tuck, head turn postures not helpful  to decrease stasis.   Mild silent aspiration of thin was cleared  with cued cough- aspiration noted with thin conducting chin tuck.      Please note, pt belched during testing with appearance of  backflow to proximal esophagus - suspect esoph component to his  dysphagia.  Also pt noted to cough during testing, which was not  always indicative of airway infiltration of barium.  Skilled  intervention included educating pt to findings, reinforcement of  effective compensation strategies.  Recommend continue current  diet (soft/thin) with strict precautions.  His asp risk did not  significantly decrease with thick vs thin liquid, therefore for  QOL rec thin.  Also recommend pt follow  esophageal precautions  due to his report of "vomiting" at home during each meal.       Treatment Recommendation  Therapy as outlined in treatment plan below    Diet Recommendation Dysphagia 3 (Mechanical Soft);Thin liquid   Liquid Administration via: Cup Medication Administration: Whole meds with puree (follow with  liquid) Supervision: Patient able to self feed Compensations: Slow rate;Small sips/bites;Multiple dry swallows  after each bite/sip;Follow solids with liquid;Hard cough after  swallow Postural Changes and/or Swallow Maneuvers: Seated upright 90  degrees;Upright 30-60 min after meal    Other  Recommendations Recommended Consults: MBS Oral Care Recommendations: Oral care BID   Follow Up Recommendations  Skilled Nursing facility    Frequency and Duration min 2x/week  2 weeks   Pertinent Vitals/Pain Afebrile, decreased   SLP Swallow Goals     General Date of Onset: 04/15/13 HPI: 68 yo male adm to [facility] with dyspnea at rest, found to have  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  PMH + for cva with  dysphagia requiring modified diet (dys3/honey per Nov 2011 MBS),  pt states he stopped using thickener when he "ran out of money".   CT head recently showed remote right sided infarcts.  Pt admits  to occasionally becoming "choked" on food more than drink.   Type of Study: Modified Barium Swallowing Study Reason for Referral: Objectively evaluate swallowing function Diet Prior to this Study: Dysphagia 3 (soft);Thin liquids Temperature Spikes Noted: No Respiratory Status: Nasal cannula History of Recent Intubation: No Behavior/Cognition: Alert;Cooperative;Pleasant mood (delayed  responses) Oral Cavity - Dentition: Poor condition (lower no dentition) Oral Motor / Sensory Function: Impaired - see Bedside swallow  eval Self-Feeding Abilities: Able to feed self Patient Positioning: Upright in chair Baseline Vocal Quality: Clear Volitional Cough: Weak (nonproductive) Volitional Swallow: Able to elicit Anatomy:   (appearance of cervical osteophytes C4-C5, C5-C6) Pharyngeal Secretions: Standing secretions in (comment) (pharynx)     Reason for Referral Objectively evaluate swallowing function   Oral Phase Oral Preparation/Oral Phase Oral Phase: Impaired Oral - Nectar Oral - Nectar Teaspoon: Delayed oral transit;Reduced posterior  propulsion Oral - Nectar Cup: Delayed oral transit;Reduced posterior  propulsion Oral - Thin Oral - Thin Teaspoon: Delayed oral transit;Reduced posterior  propulsion Oral - Thin Cup: Delayed oral transit;Reduced posterior  propulsion Oral -  Thin Straw: Delayed oral transit;Reduced posterior  propulsion Oral - Solids Oral - Puree: Delayed oral transit;Reduced posterior propulsion Oral - Regular: Impaired mastication;Delayed oral transit;Reduced  posterior propulsion   Pharyngeal Phase Pharyngeal - Nectar Pharyngeal - Nectar Teaspoon: Delayed swallow  initiation;Premature spillage to valleculae;Reduced pharyngeal  peristalsis;Reduced epiglottic inversion;Reduced anterior  laryngeal mobility;Reduced laryngeal elevation;Reduced  airway/laryngeal closure;Pharyngeal residue -  valleculae;Pharyngeal residue - pyriform sinuses;Reduced tongue  base retraction Pharyngeal - Nectar Cup: Delayed swallow initiation;Premature  spillage to valleculae;Penetration/Aspiration during  swallow;Pharyngeal residue - valleculae;Pharyngeal residue -  pyriform sinuses Penetration/Aspiration details (nectar cup): Material enters  airway, remains ABOVE vocal cords and not ejected out Pharyngeal - Thin Pharyngeal - Thin Teaspoon: Delayed swallow initiation;Premature  spillage to valleculae;Penetration/Aspiration during  swallow;Reduced epiglottic inversion;Reduced anterior laryngeal  mobility;Reduced laryngeal elevation;Reduced pharyngeal  peristalsis;Reduced airway/laryngeal closure;Reduced tongue base  retraction;Pharyngeal residue - pyriform sinuses;Pharyngeal  residue - valleculae Penetration/Aspiration details (thin  teaspoon): Material enters  airway, remains ABOVE vocal cords and not ejected out Pharyngeal - Thin Cup: Delayed swallow initiation;Premature  spillage to valleculae;Reduced laryngeal elevation;Reduced  anterior laryngeal mobility;Reduced epiglottic inversion;Reduced  pharyngeal peristalsis;Reduced airway/laryngeal closure;Reduced  tongue base retraction;Penetration/Aspiration during  swallow;Trace aspiration;Pharyngeal residue - pyriform  sinuses;Pharyngeal residue - valleculae Penetration/Aspiration details (thin cup): Material enters  airway, passes BELOW cords without attempt by patient to eject  out (silent aspiration) Pharyngeal - Thin Straw: Delayed swallow initiation;Premature  spillage to valleculae;Reduced pharyngeal peristalsis;Reduced  epiglottic inversion;Reduced anterior laryngeal mobility;Reduced  laryngeal elevation;Reduced airway/laryngeal closure;Reduced  tongue base retraction;Penetration/Aspiration during  swallow;Pharyngeal residue - cp segment;Pharyngeal residue -  posterior pharnyx Penetration/Aspiration details (thin straw): Material enters  airway, passes BELOW cords without attempt by patient to eject  out (silent aspiration) Pharyngeal - Solids Pharyngeal - Puree: Delayed swallow initiation;Premature spillage  to valleculae;Pharyngeal residue - pyriform sinuses;Reduced  anterior laryngeal mobility;Reduced epiglottic inversion;Reduced  laryngeal elevation;Reduced pharyngeal peristalsis;Reduced  airway/laryngeal closure;Reduced tongue base  retraction;Pharyngeal residue - valleculae Penetration/Aspiration details (puree): Material does not enter  airway Pharyngeal - Regular: Delayed swallow initiation;Premature  spillage to valleculae;Pharyngeal residue - valleculae;Reduced  pharyngeal peristalsis;Reduced epiglottic inversion;Reduced  anterior laryngeal mobility;Reduced laryngeal elevation;Reduced  airway/laryngeal closure;Reduced tongue base retraction  Cervical Esophageal Phase    GO     Cervical Esophageal Phase Cervical Esophageal Phase: Impaired Cervical Esophageal Phase - Nectar Nectar Teaspoon: Reduced cricopharyngeal relaxation Nectar Cup: Reduced cricopharyngeal relaxation;Esophageal  backflow into the pharynx Cervical Esophageal Phase - Thin Thin Teaspoon: Reduced cricopharyngeal relaxation Thin Cup: Reduced cricopharyngeal relaxation;Esophageal backflow  into the pharynx Thin Straw: Reduced cricopharyngeal relaxation Cervical Esophageal Phase - Solids Puree: Reduced cricopharyngeal relaxation Regular: Reduced cricopharyngeal relaxation Cervical Esophageal Phase - Comment Cervical Esophageal Comment: pt appeared with mild stasis in  esophagus without awareness with frequent belching  and backflow        Luanna Salk, MS [facility] SLP [phone]    ASSESSMENT/PLAN: this is a very pleasant 68 years old African American male with synchronous non-small cell lung cancer presenting as a stage IIIa in the right lung as well as a stage IA in the left lung base. He is status post concurrent chemoradiation and tolerated it fairly well with partial response. On the more recent CT angiogram he was found to have significant progression of disease. The patient is a cachectic and too weak to receive any sort of treatment for his progressive disease. Patient was discussed with and also seen by Dr. Julien Nordmann. Dr. Julien Nordmann had a discussion with the patient regarding admission for his dehydration and malnutrition. He was also found  to be hypokalemic at 2.9 mEq per liter. Patient was in agreement with the hospital admission as well as the inpatient hospice consult.  The patient is unable to properly care for himself. I spoke with Dr. Edwin Cap, of the hospitalist service, who agreed to admit Mr. Anastasi.   He was advised to call me immediately if he has any concerning symptoms in the interval.  All questions were answered. The patient knows to call the clinic with any problems, questions or concerns. We can  certainly see the patient much sooner if necessary.  Carlton Adam PA-C  ADDENDUM: Hematology/Oncology Attending: I had a face to face encounter with the patient today. I recommended his care plan. This is a very pleasant 68 years old [place] American male who presented with progressive non-small cell lung cancer, squamous cell carcinoma, was significant enlargement of the right lower lobe lung mass as well as innumerable bilateral pulmonary nodules. The patient is so weak and his performance status is ECoG 3 at this point. His social situation is also very difficult as the patient lives by himself at home and he has no close family members or friends to check them out. He has significant weakness and fatigue as well as poor by mouth intake and dehydration. I did not feel that it was safe for the patient to go back on home. I recommended for him admission to [facility] for further evaluation and management of his dehydration and weakness.  I do think the patient would be a good candidate for any further systemic chemotherapy for his progressive disease. I strongly recommended for him to consider palliative care and hospice referral and he is in agreement. I would see the patient on as needed basis and I would be happy to manage his hospice needs.  Disclaimer: This note was dictated with voice recognition software. Similar sounding words can inadvertently be transcribed and may not be corrected upon review. Eilleen Kempf., MD 05/07/2013

## 2013-05-07 NOTE — Consult Note (Signed)
Patient Benjamin Moss      DOB: 1946/03/17      ZCH:[id number]     Consult Note from the Palliative Medicine Team at [place] [facility] Requested by: Benjamin Moss     PCP: Benjamin Jewel, MD Reason for Consultation:Clarification of Benjamin Moss and options     Phone Number:[phone]  Assessment of patients Current state: Benjamin Moss 68 y.o. male with H/O metastatics lung cancer.  Recent CT angiography of the chest revealed marked progression of metastatic lung carcinoma within the chest was significant enlargement in the primary right lower lobe mass as well as innumerable metastatic nodules throughout both lungs. There is some question of lymphangitic spread of tumor in the right lower lobe.  He has poor social support.  He continues to decline physically and functionally.  He tells me today his best options is "to go to hospice and be comfortable"     This NP Benjamin Moss reviewed medical records, received report from team, assessed the patient and then meet at the patient's bedside along with his friend and neighbor Benjamin Moss(only support person he has) to discuss diagnosis prognosis, GOC, EOL wishes disposition and options.  Patient verbalizes he wishes for Benjamin Moss to be his spokes person if he cannot speak for himself.   The patient is able to verbalize an understanding of his medical situation and has good insight into his needs and wants at this time.  A detailed discussion was had today regarding advanced directives.  Concepts specific to code status, artifical feeding and hydration, continued IV antibiotics and rehospitalization was had.  The difference between a aggressive medical intervention path  and a palliative comfort care path for this patient at this time was had.  Values and goals of care important to patient  were attempted to be elicited.  Concept of Hospice and Palliative Care were discussed  Natural trajectory and expectations at EOL were discussed.  Questions  and concerns addressed. Marland Kitchen  PMT will continue to support holistically.    Goals of Care: 1.  Code Status: DNR/DNI-comfort is main focus of care   2. Scope of Treatment: 1. Vital Signs: daily  2. Respiratory/Oxygen:for comfort only 3. Nutritional Support/Tube Feeds:no artifical feeding now or in the future            Comfort feeds as tolerated 4. Antibiotics: none 5. Blood Products:none 6. IVF:  KVO for medications only 7. Review of Medications to be discontinued:minimize for comfort 8. Labs: none 9. Telemetry:none 10. Consults:none   4. Disposition:  Hopeful for hospice facility, will write for choice   3. Symptom Management:   1. Anxiety/Agitation: Ativan 1 mg IV every 4 hrs prn 2. Pain/Dyspnea: Morphine 1 mg IV every 1 hr prn   4. Psychosocial:  Emotional support offered to patient and his friend at bedside.  Both express appreciation for each other  5. Spiritual: Chaplain consulted   Brief Benjamin Moss 68 y.o. male returns to the clinic today for followup visit. He had been recently hospitalized and was in a short term nursing facility briefly. Patient states that he left the facility early as he was unhappy with his treatment. He presents today for further evaluation. Recent CT angiography of the chest revealed marked progression of metastatic lung carcinoma within the chest was significant enlargement in the primary right lower lobe mass as well as innumerable metastatic nodules throughout both lungs. There is some question of lymphangitic spread of tumor in the right lower lobe.  He complains of feeling cold. He is at home alone and self reports that he is able to bathe dress and cook for himself however also admits that he does not always perform these tasks. He has a friend that checks in on him about once a week bringing him food and helping to straighten his home, and he also states that he has a nurse that comes in once a month. He initially he voiced interested in  some sort of treatment for his progressive disease however now is feeling too weak to proceed with any treatment. His by mouth intake is poor both of food and fluids. He has lost approximately 19 pounds since 05/01/2012. He is down 11 pounds from 04/17/2013. He complains of feeling cold  Decisions regarding advanced directives today  ROS: weakness, fatigue, poor appetite with weight loss    PMH:  Past Medical History  Diagnosis Date  . Hyperlipidemia   . Hypertension   . COPD (chronic obstructive pulmonary disease)   . Stroke     left side weakness, ambulates with cane/ walker  . Right lower lobe lung mass   . Hypoxia   . Nodule of left lung   . Hx of radiation therapy 02/07/12-05/10/12    R central chest hilar,mediastinal region 63 gray, 35 fx  . Lung cancer 01/19/2012     PSH: Past Surgical History  Procedure Laterality Date  . Tonsillectomy     I have reviewed the [facility] and [facility] and  If appropriate update it with new information. No Known Allergies Scheduled Meds: . docusate sodium  200 mg Oral BID  . feeding supplement (ENSURE COMPLETE)  237 mL Oral TID BM  . guaiFENesin  600 mg Oral BID  . ipratropium-albuterol  3 mL Nebulization TID  . magnesium sulfate 1 - 4 g bolus IVPB  2 g Intravenous Once  . nicotine  21 mg Transdermal Daily  . potassium chloride  10 mEq Intravenous Q1 Hr x 4  . senna  2 tablet Oral QHS  . sodium chloride  1,000 mL Intravenous Once   Continuous Infusions: . 0.9 % NaCl with KCl 40 mEq / L     PRN Meds:.acetaminophen, acetaminophen, bisacodyl, HYDROmorphone (DILAUDID) injection, ibuprofen, LORazepam, morphine CONCENTRATE, ondansetron (ZOFRAN) IV, ondansetron, polyethylene glycol, promethazine, sodium phosphate    BP 95/71  Pulse 93  Temp(Src) 98.1 F (36.7 C) (Oral)  Resp 16  Ht 5\' 9"  (1.753 m)  Wt 45.7 kg (100 lb 12 oz)  BMI 14.87 kg/m2  SpO2 94%   PPS:30 %  No intake or output data in the 24 hours ending 05/07/13 1515 LBM: PTA                       Physical Exam:  General: frail, cachectic black male  NAD HEENT:  Poor dentati on, dry buccal membranes, no exudate Chest:   Diminished in bases  CTA CVS: tachycardic Abdomen: soft NT, flat Ext: without edeam Neuro: alert and oriented X3  Labs: CBC    Component Value Date/Time   WBC 7.0 05/07/2013 1019   WBC 9.6 04/15/2013 0619   RBC 3.69* 05/07/2013 1019   RBC 3.00* 04/15/2013 0619   HGB 11.1* 05/07/2013 1019   HGB 9.1* 04/15/2013 0619   HCT 33.2* 05/07/2013 1019   HCT 26.9* 04/15/2013 0619   PLT 355 05/07/2013 1019   PLT 301 04/15/2013 0619   MCV 90.0 05/07/2013 1019   MCV 89.7 04/15/2013 0619   MCH  30.1 05/07/2013 1019   MCH 30.3 04/15/2013 0619   MCHC 33.4 05/07/2013 1019   MCHC 33.8 04/15/2013 0619   RDW 14.8* 05/07/2013 1019   RDW 14.6 04/15/2013 0619   LYMPHSABS 1.6 05/07/2013 1019   LYMPHSABS 0.4* 04/13/2013 1233   MONOABS 0.7 05/07/2013 1019   MONOABS 0.3 04/13/2013 1233   EOSABS 0.2 05/07/2013 1019   EOSABS 0.0 04/13/2013 1233   BASOSABS 0.1 05/07/2013 1019   BASOSABS 0.0 04/13/2013 1233    BMET    Component Value Date/Time   Moss 139 05/07/2013 1019   Moss 140 04/15/2013 0619   K 2.9* 05/07/2013 1019   K 3.9 04/15/2013 0619   CL 104 04/15/2013 0619   CL 101 09/18/2012 1040   CO2 22 05/07/2013 1019   CO2 25 04/15/2013 0619   GLUCOSE 139 05/07/2013 1019   GLUCOSE 99 04/15/2013 0619   GLUCOSE 94 09/18/2012 1040   BUN 22.6 05/07/2013 1019   BUN 23 04/15/2013 0619   CREATININE 1.1 05/07/2013 1019   CREATININE 0.87 04/15/2013 0619   CALCIUM 12.2* 05/07/2013 1019   CALCIUM 9.3 04/15/2013 0619   GFRNONAA 88* 04/15/2013 0619   GFRAA >90 04/15/2013 0619    CMP     Component Value Date/Time   Moss 139 05/07/2013 1019   Moss 140 04/15/2013 0619   K 2.9* 05/07/2013 1019   K 3.9 04/15/2013 0619   CL 104 04/15/2013 0619   CL 101 09/18/2012 1040   CO2 22 05/07/2013 1019   CO2 25 04/15/2013 0619   GLUCOSE 139 05/07/2013 1019   GLUCOSE 99 04/15/2013 0619   GLUCOSE 94 09/18/2012 1040   BUN 22.6 05/07/2013 1019   BUN  23 04/15/2013 0619   CREATININE 1.1 05/07/2013 1019   CREATININE 0.87 04/15/2013 0619   CALCIUM 12.2* 05/07/2013 1019   CALCIUM 9.3 04/15/2013 0619   PROT 7.4 05/07/2013 1019   PROT 7.3 04/13/2013 1025   ALBUMIN 2.8* 05/07/2013 1019   ALBUMIN 3.0* 04/13/2013 1025   AST 9 05/07/2013 1019   AST 13 04/13/2013 1025   ALT 8 05/07/2013 1019   ALT 6 04/13/2013 1025   ALKPHOS 73 05/07/2013 1019   ALKPHOS 72 04/13/2013 1025   BILITOT 0.25 05/07/2013 1019   BILITOT 0.4 04/13/2013 1025   GFRNONAA 88* 04/15/2013 0619   GFRAA >90 04/15/2013 0619      Time In Time Out Total Time Spent with Patient Total Overall Time  1345 1500 70 min 75 min    Greater than 50%  of this time was spent counseling and coordinating care related to the above assessment and plan.  Benjamin Lessen NP  Palliative Medicine Team Team Phone # [phone] Pager [phone]   Discussed with Benjamin Moss

## 2013-05-07 NOTE — Patient Instructions (Signed)
You are being admitted to address your dehydration, malnutrition and hypokalemia. Per your request an inpatient hospice referral will be made

## 2013-05-08 DIAGNOSIS — C801 Malignant (primary) neoplasm, unspecified: Secondary | ICD-10-CM

## 2013-05-08 DIAGNOSIS — J449 Chronic obstructive pulmonary disease, unspecified: Secondary | ICD-10-CM

## 2013-05-08 DIAGNOSIS — C3490 Malignant neoplasm of unspecified part of unspecified bronchus or lung: Principal | ICD-10-CM

## 2013-05-08 DIAGNOSIS — J9600 Acute respiratory failure, unspecified whether with hypoxia or hypercapnia: Secondary | ICD-10-CM

## 2013-05-08 LAB — POTASSIUM: Potassium: 3.2 mEq/L — ABNORMAL LOW (ref 3.7–5.3)

## 2013-05-08 MED ORDER — DSS 100 MG PO CAPS
200.0000 mg | ORAL_CAPSULE | Freq: Two times a day (BID) | ORAL | Status: AC
Start: 2013-05-08 — End: ?

## 2013-05-08 MED ORDER — SENNA 8.6 MG PO TABS: 2.0000 | ORAL_TABLET | Freq: Every day | ORAL | Status: AC

## 2013-05-08 MED ORDER — ACETAMINOPHEN 325 MG PO TABS: 650.0000 mg | ORAL_TABLET | Freq: Four times a day (QID) | ORAL | Status: AC | PRN

## 2013-05-08 MED ORDER — IPRATROPIUM-ALBUTEROL 0.5-2.5 (3) MG/3ML IN SOLN: 3.0000 mL | Freq: Three times a day (TID) | RESPIRATORY_TRACT | Status: AC

## 2013-05-08 MED ORDER — MORPHINE SULFATE (CONCENTRATE) 10 MG /0.5 ML PO SOLN
5.0000 mg | Freq: Four times a day (QID) | ORAL | Status: DC
  Administered 2013-05-08 (×2): 5 mg via ORAL
  Filled 2013-05-08 (×2): qty 0.5

## 2013-05-08 MED ORDER — MORPHINE SULFATE (CONCENTRATE) 10 MG /0.5 ML PO SOLN: 5.0000 mg | Freq: Four times a day (QID) | ORAL | Status: AC

## 2013-05-08 MED ORDER — ONDANSETRON HCL 4 MG PO TABS: 4.0000 mg | ORAL_TABLET | Freq: Four times a day (QID) | ORAL | Status: AC | PRN

## 2013-05-08 NOTE — Discharge Summary (Signed)
Physician Discharge Summary  LETROY VAZGUEZ WCH:[id number] DOB: 06-26-1945 DOA: 05/07/2013  PCP: Antonietta Jewel, MD  Admit date: 05/07/2013 Discharge date: 05/08/2013  Recommendations for Outpatient Follow-up:  1. Discharge to residential hospice today  Discharge Diagnoses:  Active Problems:   TOBACCO ABUSE   DYSPHAGIA DUE TO CEREBROVASCULAR DISEASE   COPD   Anemia   Dehydration   Metastatic primary lung cancer   Hypokalemia   Palliative care encounter   Dyspnea   Pain, generalized    Discharge Condition: medically stable for discharge to residential hospice today  Diet recommendation: as tolerated   History of present illness:  68 y.o. year-old male with past medical history of metastatic squamous cell lung carcinoma followed by Dr. 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  Admission labs were notable for potassium 2.9, BUN 23, creatinine 1.1, calcium 12.2, hemoglobin 11.1. Per oncology due to pt decreased functional status, he is no longer a candidate for chemotherapy and is a good inpatient hospice candidate.   Assessment and Plan:  Principal Problem: Failure to thrive, dehydration, deconditioning, weakness - secondary to progressive lung ca - plan for hospice today - appreciate palliative care team for their assistance - Palliative care consult for symptom management (nausea)   Active Problems: Nausea and vomiting - my be related to constipation, impending bowel obstruction from cancer  - antiemetics PRN Hypokalemia - repleted - no further blood draws to ensure comfort care Rectal pain - ikely related to constipation  - tx constipation  Headache  - Tylenol prn, morphine conc soln  Dyspnea - ikely secondary to COPD and metastatic cancer. No  clinical evidence of pneumonia.  - cont duonebs  - added prn morphine (sol'n because of nausea/difficulty tolerating PO)  - continue guaifenesin  HTN - with hypotension due to dehydration and progressive cancer  - Stopper BP meds - continue IV fluids in hospital Stroke with left hemiparesis   - discontinued plavix  - dysphagia 3 with thin liquids  Severe protein-calorie malnutrition  - Dysphagia 3 diet with thin liquids per SLP eval Cigarette/tobacco use -  Continue nicotine patch   Code Status: DNR/DNI Family Communication: patient alone     Signed:  Leisa Lenz, MD  [facility] 05/08/2013, 4:04 PM  Pager #: [phone]  Discharge Exam: Filed Vitals:   05/08/13 1351  BP: 88/68  Pulse: 73  Temp: 98.4 F (36.9 C)  Resp: 16   Filed Vitals:   05/08/13 0653 05/08/13 0744 05/08/13 1346 05/08/13 1351  BP:    88/68  Pulse: 81   73  Temp:    98.4 F (36.9 C)  TempSrc:    Oral  Resp:    16  Height:      Weight:      SpO2: 98% 93% 98% 98%    General: Pt is sleeping, no acute distress Cardiovascular: Regular rate and rhythm, S1/S2 appreciated  Respiratory: diminished breath sounds, wheezing appreciated Abdominal: Soft, non tender, non distended, bowel sounds appreciated Extremities: pulses palpable bilaterally DP and PT Neuro: left facial droop, left side weakness  Discharge Instructions  Discharge Orders   Future Orders Complete By Expires   Call MD for:  difficulty breathing, headache or visual disturbances  As directed    Call MD for:  persistant dizziness or light-headedness  As directed  Call MD for:  persistant nausea and vomiting  As directed    Call MD for:  severe uncontrolled pain  As directed    Diet - low sodium heart healthy  As directed    Increase activity slowly  As directed        Medication List    STOP taking these medications       amLODipine 10 MG tablet  Commonly known as:  NORVASC     clopidogrel 75 MG tablet   Commonly known as:  PLAVIX     hydrochlorothiazide 25 MG tablet  Commonly known as:  HYDRODIURIL     HYDROcodone-acetaminophen 5-325 MG per tablet  Commonly known as:  NORCO/VICODIN      TAKE these medications       acetaminophen 325 MG tablet  Commonly known as:  TYLENOL  Take 2 tablets (650 mg total) by mouth every 6 (six) hours as needed for mild pain (or Fever >/= 101).     albuterol 108 (90 BASE) MCG/ACT inhaler  Commonly known as:  PROVENTIL HFA;VENTOLIN HFA  Inhale 2 puffs into the lungs every 6 (six) hours as needed for wheezing.     DSS 100 MG Caps  Take 200 mg by mouth 2 (two) times daily.     feeding supplement (ENSURE COMPLETE) Liqd  Take 237 mLs by mouth 3 (three) times daily between meals.     guaiFENesin 600 MG 12 hr tablet  Commonly known as:  MUCINEX  Take 1 tablet (600 mg total) by mouth 2 (two) times daily.     ipratropium-albuterol 0.5-2.5 (3) MG/3ML Soln  Commonly known as:  DUONEB  Take 3 mLs by nebulization 3 (three) times daily.     morphine CONCENTRATE 10 mg / 0.5 ml concentrated solution  Take 0.25 mLs (5 mg total) by mouth every 6 (six) hours.     nicotine 21 mg/24hr patch  Commonly known as:  NICODERM CQ - dosed in mg/24 hours  Place 1 patch (21 mg total) onto the skin daily.     ondansetron 4 MG tablet  Commonly known as:  ZOFRAN  Take 1 tablet (4 mg total) by mouth every 6 (six) hours as needed for nausea.     senna 8.6 MG Tabs tablet  Commonly known as:  SENOKOT  Take 2 tablets (17.2 mg total) by mouth at bedtime.           Follow-up Information   Follow up with [facility], [place]. (As needed)    Specialty:  Internal Medicine   Contact information:   [address] [place] [address] [phone]        The results of significant diagnostics from this hospitalization (including imaging, microbiology, ancillary and laboratory) are listed below for reference.    Significant Diagnostic Studies: Ct Head W Wo  Contrast  04-25-13   CLINICAL DATA:  68 year old male with left hemiparesis and headache. History of lung cancer. Marland Kitchen  EXAM: CT HEAD WITHOUT AND WITH CONTRAST  TECHNIQUE: Contiguous axial images were obtained from the base of the skull through the vertex without and with intravenous contrast  CONTRAST:  185mL OMNIPAQUE IOHEXOL 300 MG/ML  SOLN  COMPARISON:  01/09/2012 MRI and 02/08/2010 CT  FINDINGS: Remote right basal ganglia and right frontoparietal infarcts again noted.  Mild chronic small-vessel white matter ischemic changes are again identified.  No acute intracranial abnormalities are identified, including abnormal enhancement, mass lesion or mass effect, hydrocephalus, extra-axial fluid collection, midline shift, hemorrhage, or  acute infarction. The visualized bony calvarium is unremarkable.  IMPRESSION: No evidence of acute intracranial abnormality or metastatic disease.  Remote right side infarcts and chronic small-vessel white matter ischemic changes.   Electronically Signed   By: Hassan Rowan M.D.   On: 04/14/2013 13:52   Ct Angio Chest Pe W/cm &/or Wo Cm  04/13/2013   CLINICAL DATA:  Shortness of breath and history of lung carcinoma.  EXAM: CT ANGIOGRAPHY CHEST WITH CONTRAST  TECHNIQUE: Multidetector CT imaging of the chest was performed using the standard protocol during bolus administration of intravenous contrast. Multiplanar CT image reconstructions including MIPs were obtained to evaluate the vascular anatomy.  CONTRAST:  136mL OMNIPAQUE IOHEXOL 350 MG/ML SOLN  COMPARISON:  Multiple prior CT exams of the chest. The latest is dated 09/18/2012.  FINDINGS: The pulmonary arteries are well opacified at the time of the examination. There is no evidence of pulmonary embolism.  There is significant progression of lung carcinoma and metastatic disease in the chest. The primary mass centered in the superior segment of the right lower lobe shows significant interval enlargement with maximal transverse  dimensions of approximately 5.7 x 6.4 cm. Adjacent parenchymal changes likely reflect post radiation therapy changes. There may be lymphangitic spread of tumor in the right lower lobe, as well, which is strongly suspected given the multitude of other metastatic lesions identified.  Innumerable new metastatic nodules are identified throughout all lobes of both lungs. Largest measured nodules are a 1.2 cm nodule in the right middle lobe, 1.3 cm nodule at the posterior basilar right lower lobe and 1.3 cm nodule in the left lower lobe. There are too many new metastatic nodules to count.  Progressive lymphadenopathy is also identified in the right hilum and mediastinum. Largest right hilar lymph node measures approximately 2.3 x 3.0 cm. Subcarinal lymphadenopathy measures 1.8 cm in short axis.  There is a trace amount of pleural fluid on the right. No pericardial fluid is identified. The heart size is normal. No bony lesions are identified.  Review of the MIP images confirms the above findings.  IMPRESSION: Marked progression of metastatic lung carcinoma in the chest with significant enlargement of the primary right lower lobe mass as well as innumerable new metastatic nodules throughout both lungs. There may also be lymphangitic spread of tumor in the right lower lobe.   Electronically Signed   By: Aletta Edouard M.D.   On: 04/13/2013 13:05   Dg Chest Port 1 View  04/13/2013   CLINICAL DATA:  Shortness of breath.  Lung cancer.  EXAM: PORTABLE CHEST - 1 VIEW  COMPARISON:  Chest CT 09/18/2012.  FINDINGS: The a power port is in good position. The cardiac silhouette, mediastinal and hilar contours are stable. There is a large right hilar mass and right hilar adenopathy. Underlying advanced emphysematous changes with increased interstitial markings in both lungs, right greater than left which could reflect asymmetric edema. Could not exclude the possibly of interstitial spread of tumor on the right. Suspect small  pulmonary nodules No definite pleural effusion. The bony thorax is grossly intact.  IMPRESSION: Enlarging right hilar mass and hilar adenopathy.  Increased interstitial markings in both lungs could reflect asymmetric noncardiogenic pulmonary edema. Could not exclude the possibility of interstitial spread of tumor in the right lung.  Underlying emphysematous changes.  Suspect pulmonary metastasis.   Electronically Signed   By: Kalman Jewels M.D.   On: 04/13/2013 11:07   Dg Swallowing Func-speech Pathology  04/15/2013   Macario Golds,  CCC-SLP     04/15/2013  1:53 PM Objective Swallowing Evaluation: Modified Barium Swallowing Study   Patient Details  Name: Benjamin Moss MRN: [id number] Date of Birth: 07-18-1945  Today's Date: 04/15/2013 Time: 1240-1315 SLP Time Calculation (min): 35 min  Past Medical History:  Past Medical History  Diagnosis Date  . Hyperlipidemia   . Hypertension   . COPD (chronic obstructive pulmonary disease)   . Stroke     left side weakness, ambulates with cane/ walker  . Right lower lobe lung mass   . Hypoxia   . Nodule of left lung   . Hx of radiation therapy 02/07/12-05/10/12    R central chest hilar,mediastinal region 63 gray, 35 fx  . Lung cancer 01/19/2012   Past Surgical History:  Past Surgical History  Procedure Laterality Date  . Tonsillectomy     HPI:  68 yo male adm to [facility] with dyspnea at rest, found to have  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  PMH + for cva with  dysphagia requiring modified diet (dys3/honey per Nov 2011 MBS),  pt states he stopped using thickener when he "ran out of money".   CT head recently showed remote right sided infarcts.  Pt admits  to occasionally becoming "choked" on food more than drink.       Assessment / Plan / Recommendation Clinical Impression  Dysphagia Diagnosis: Moderate oral phase dysphagia;Moderate  pharyngeal phase dysphagia;Moderate cervical esophageal phase  dysphagia   Clinical impression: Moderate oropharyngeal and cervical   esophageal dysphagia with sensorimotor deficits- likely from CVA  with exacerbation from acute illness.  Delayed oral transiting,  decreased oral propulsion due to weakness noted.  Pharyngeal  swallow characterized by delayed swallow initiation (up to 4  seconds at pyriform), decreased laryngeal elevation/closure  contributing to stasis/laryngeal penetration.  Pt independently  conducted extended breath hold with MULTIPLE dry swallows aiding  pharyngeal clearance.  This maneuever is tiring for pt but  effective.  Cued throat clear/cough removed majority of laryngeal  penetrates/aspirates.  Chin tuck, head turn postures not helpful  to decrease stasis.   Mild silent aspiration of thin was cleared  with cued cough- aspiration noted with thin conducting chin tuck.      Please note, pt belched during testing with appearance of  backflow to proximal esophagus - suspect esoph component to his  dysphagia.  Also pt noted to cough during testing, which was not  always indicative of airway infiltration of barium.  Skilled  intervention included educating pt to findings, reinforcement of  effective compensation strategies.  Recommend continue current  diet (soft/thin) with strict precautions.  His asp risk did not  significantly decrease with thick vs thin liquid, therefore for  QOL rec thin.  Also recommend pt follow esophageal precautions  due to his report of "vomiting" at home during each meal.       Treatment Recommendation  Therapy as outlined in treatment plan below    Diet Recommendation Dysphagia 3 (Mechanical Soft);Thin liquid   Liquid Administration via: Cup Medication Administration: Whole meds with puree (follow with  liquid) Supervision: Patient able to self feed Compensations: Slow rate;Small sips/bites;Multiple dry swallows  after each bite/sip;Follow solids with liquid;Hard cough after  swallow Postural Changes and/or Swallow Maneuvers: Seated upright 90  degrees;Upright 30-60 min after meal    Other   Recommendations Recommended Consults: MBS Oral Care Recommendations: Oral care BID   Follow Up Recommendations  Skilled Nursing facility    Frequency and Duration min 2x/week  2 weeks   Pertinent Vitals/Pain Afebrile, decreased   SLP Swallow Goals     General Date of Onset: 04/15/13 HPI: 68 yo male adm to [facility] with dyspnea at rest, found to have  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  PMH + for cva with  dysphagia requiring modified diet (dys3/honey per Nov 2011 MBS),  pt states he stopped using thickener when he "ran out of money".   CT head recently showed remote right sided infarcts.  Pt admits  to occasionally becoming "choked" on food more than drink.   Type of Study: Modified Barium Swallowing Study Reason for Referral: Objectively evaluate swallowing function Diet Prior to this Study: Dysphagia 3 (soft);Thin liquids Temperature Spikes Noted: No Respiratory Status: Nasal cannula History of Recent Intubation: No Behavior/Cognition: Alert;Cooperative;Pleasant mood (delayed  responses) Oral Cavity - Dentition: Poor condition (lower no dentition) Oral Motor / Sensory Function: Impaired - see Bedside swallow  eval Self-Feeding Abilities: Able to feed self Patient Positioning: Upright in chair Baseline Vocal Quality: Clear Volitional Cough: Weak (nonproductive) Volitional Swallow: Able to elicit Anatomy:  (appearance of cervical osteophytes C4-C5, C5-C6) Pharyngeal Secretions: Standing secretions in (comment) (pharynx)     Reason for Referral Objectively evaluate swallowing function   Oral Phase Oral Preparation/Oral Phase Oral Phase: Impaired Oral - Nectar Oral - Nectar Teaspoon: Delayed oral transit;Reduced posterior  propulsion Oral - Nectar Cup: Delayed oral transit;Reduced posterior  propulsion Oral - Thin Oral - Thin Teaspoon: Delayed oral transit;Reduced posterior  propulsion Oral - Thin Cup: Delayed oral transit;Reduced posterior  propulsion Oral - Thin Straw: Delayed oral transit;Reduced posterior   propulsion Oral - Solids Oral - Puree: Delayed oral transit;Reduced posterior propulsion Oral - Regular: Impaired mastication;Delayed oral transit;Reduced  posterior propulsion   Pharyngeal Phase Pharyngeal - Nectar Pharyngeal - Nectar Teaspoon: Delayed swallow  initiation;Premature spillage to valleculae;Reduced pharyngeal  peristalsis;Reduced epiglottic inversion;Reduced anterior  laryngeal mobility;Reduced laryngeal elevation;Reduced  airway/laryngeal closure;Pharyngeal residue -  valleculae;Pharyngeal residue - pyriform sinuses;Reduced tongue  base retraction Pharyngeal - Nectar Cup: Delayed swallow initiation;Premature  spillage to valleculae;Penetration/Aspiration during  swallow;Pharyngeal residue - valleculae;Pharyngeal residue -  pyriform sinuses Penetration/Aspiration details (nectar cup): Material enters  airway, remains ABOVE vocal cords and not ejected out Pharyngeal - Thin Pharyngeal - Thin Teaspoon: Delayed swallow initiation;Premature  spillage to valleculae;Penetration/Aspiration during  swallow;Reduced epiglottic inversion;Reduced anterior laryngeal  mobility;Reduced laryngeal elevation;Reduced pharyngeal  peristalsis;Reduced airway/laryngeal closure;Reduced tongue base  retraction;Pharyngeal residue - pyriform sinuses;Pharyngeal  residue - valleculae Penetration/Aspiration details (thin teaspoon): Material enters  airway, remains ABOVE vocal cords and not ejected out Pharyngeal - Thin Cup: Delayed swallow initiation;Premature  spillage to valleculae;Reduced laryngeal elevation;Reduced  anterior laryngeal mobility;Reduced epiglottic inversion;Reduced  pharyngeal peristalsis;Reduced airway/laryngeal closure;Reduced  tongue base retraction;Penetration/Aspiration during  swallow;Trace aspiration;Pharyngeal residue - pyriform  sinuses;Pharyngeal residue - valleculae Penetration/Aspiration details (thin cup): Material enters  airway, passes BELOW cords without attempt by patient to eject  out (silent  aspiration) Pharyngeal - Thin Straw: Delayed swallow initiation;Premature  spillage to valleculae;Reduced pharyngeal peristalsis;Reduced  epiglottic inversion;Reduced anterior laryngeal mobility;Reduced  laryngeal elevation;Reduced airway/laryngeal closure;Reduced  tongue base retraction;Penetration/Aspiration during  swallow;Pharyngeal residue - cp segment;Pharyngeal residue -  posterior pharnyx Penetration/Aspiration details (thin straw): Material enters  airway, passes BELOW cords without attempt by patient to eject  out (silent aspiration) Pharyngeal - Solids Pharyngeal - Puree: Delayed swallow initiation;Premature spillage  to valleculae;Pharyngeal residue - pyriform sinuses;Reduced  anterior laryngeal mobility;Reduced epiglottic inversion;Reduced  laryngeal elevation;Reduced pharyngeal peristalsis;Reduced  airway/laryngeal closure;Reduced tongue base  retraction;Pharyngeal residue - valleculae Penetration/Aspiration  details (puree): Material does not enter  airway Pharyngeal - Regular: Delayed swallow initiation;Premature  spillage to valleculae;Pharyngeal residue - valleculae;Reduced  pharyngeal peristalsis;Reduced epiglottic inversion;Reduced  anterior laryngeal mobility;Reduced laryngeal elevation;Reduced  airway/laryngeal closure;Reduced tongue base retraction  Cervical Esophageal Phase    GO    Cervical Esophageal Phase Cervical Esophageal Phase: Impaired Cervical Esophageal Phase - Nectar Nectar Teaspoon: Reduced cricopharyngeal relaxation Nectar Cup: Reduced cricopharyngeal relaxation;Esophageal  backflow into the pharynx Cervical Esophageal Phase - Thin Thin Teaspoon: Reduced cricopharyngeal relaxation Thin Cup: Reduced cricopharyngeal relaxation;Esophageal backflow  into the pharynx Thin Straw: Reduced cricopharyngeal relaxation Cervical Esophageal Phase - Solids Puree: Reduced cricopharyngeal relaxation Regular: Reduced cricopharyngeal relaxation Cervical Esophageal Phase - Comment Cervical  Esophageal Comment: pt appeared with mild stasis in  esophagus without awareness with frequent belching  and backflow        Luanna Salk, MS [facility] SLP [phone]     Microbiology: Recent Results (from the past 240 hour(s))  TECHNOLOGIST REVIEW     Status: None   Collection Time    05/07/13 10:19 AM      Result Value Range Status   Technologist Review     Final   Value: Platelet Est Adequate. Few Platelet Clumps Present.     Labs: Basic Metabolic Panel:  Recent Labs Lab 05/07/13 1019 05/08/13 1145  NA 139  --   K 2.9* 3.2*  CO2 22  --   GLUCOSE 139  --   BUN 22.6  --   CREATININE 1.1  --   CALCIUM 12.2*  --    Liver Function Tests:  Recent Labs Lab 05/07/13 1019  AST 9  ALT 8  ALKPHOS 73  BILITOT 0.25  PROT 7.4  ALBUMIN 2.8*   No results found for this basename: LIPASE, AMYLASE,  in the last 168 hours No results found for this basename: AMMONIA,  in the last 168 hours CBC:  Recent Labs Lab 05/07/13 1019  WBC 7.0  NEUTROABS 4.3  HGB 11.1*  HCT 33.2*  MCV 90.0  PLT 355   Cardiac Enzymes: No results found for this basename: CKTOTAL, CKMB, CKMBINDEX, TROPONINI,  in the last 168 hours BNP: BNP (last 3 results) No results found for this basename: PROBNP,  in the last 8760 hours CBG: No results found for this basename: GLUCAP,  in the last 168 hours  Time coordinating discharge: Over 30 minutes

## 2013-05-08 NOTE — Progress Notes (Signed)
Patient stable from AM assessment. Report called to [facility] home. PAC to be left accessed per request from hospice. PAC flushed with 10cc normal saline. Patient to be transferred via PTAR to facility.

## 2013-05-08 NOTE — Progress Notes (Signed)
CSW received notification from [facility] that pt was approved for admission to [facility] and bed available today.  CSW met with pt at bedside and pt neighbor/friend, Timmothy Sours present at this time. Pt agreeable to transition to [facility] [place] [facility] this afternoon.  CSW notified MD in order for MD to complete discharge paperwork.  CSW assisted pt in completing [place] document and arranged witnesses and a notary to notarize document. CSW provided Mr. Mitzie Na original and copy of HCPOA at pt request and provided copy in discharge packet and copy in shadow chart.  CSW facilitated pt discharge needs to [facility] including contacting facility, confirming RN provided report, discussing with pt and pt neighbor, and arranging ambulance transport for pt to [facility].  Pt agreeable and appears to be coping appropriately with plans to transition to [place] for continued comfort care.  No further social work needs identified at this time.  CSW signing off.   Alison Murray, MSW, [place] Work [phone]

## 2013-05-08 NOTE — Progress Notes (Addendum)
Clinical Social Work Department BRIEF PSYCHOSOCIAL ASSESSMENT 05/08/2013  Patient:  Benjamin Moss, Benjamin Moss     Account Number:  [id number]     Admit date:  05/07/2013  Clinical Social Worker:  Ulyess Blossom  Date/Time:  05/08/2013 10:00 AM  Referred by:  Physician  Date Referred:  05/08/2013 Referred for  Residential hospice placement   Other Referral:   Interview type:  Patient Other interview type:    PSYCHOSOCIAL DATA Living Status:  ALONE Admitted from facility:   Level of care:   Primary support name:  Gwyndolyn Saxon [place]/neighbor/[phone] Primary support relationship to patient:  FRIEND Degree of support available:   lacking, pt has poor social support, appears Mr. Mitzie Na is only support person that pt has    CURRENT CONCERNS Current Concerns  Post-Acute Placement   Other Concerns:    SOCIAL WORK ASSESSMENT / PLAN CSW received referral for residential hospice placement.    CSW met with pt at bedside to discuss.    CSW introduced self and explained role.    CSW discussed with pt that CSW had received referral to assist pt with exploring residential hospice facility options with pt from PMT NP, Wadie Lessen. Pt expressed understanding.    CSW provided pt with list of residential hospice facilities and named the facilities locally for pt. Pt stated that his first choice would be [facility] and second choice would be [facility]. Pt discussed that he used to stay in [facility] and would like to go to the hospice home there. Pt agreeable to referral to [facility] as secondary choice as well and expressed understanding when CSW discussed that pt would need to transfer to first hospice home bed that became available.    CSW made referral to [facility] [place].    CSW made referral to [facility], Erling Conte.    CSW to follow up with pt once CSW receives response regarding bed availability.    CSW to continue to follow to provide support  and assist with residential hospice placement.   Assessment/plan status:  Psychosocial Support/Ongoing Assessment of Needs Other assessment/ plan:   discharge planning   Information/referral to community resources:   Residential hospice list, referral to [place]    PATIENT'S/FAMILY'S RESPONSE TO PLAN OF CARE: Pt alert and oriented x 4. Pt expressed that the wants to go to a hospice home where he can receive the appropriate care and remain comfortable. Pt agreable to [facility] or [facility].     Alison Murray, MSW, [place] Work [phone]

## 2013-05-08 NOTE — Progress Notes (Signed)
CSW received return phone call from [facility], Orbie Pyo.   Per [facility], facility has bed available today. [facility] liaison, Orbie Pyo plans to come to hospital between 12:30pm and 1 pm to assess and speak to pt. As long as pt eligible per [facility] then pt able to transition to [facility] [place] this afternoon.  CSW discussed with pt at bedside and pt agreeable to transition this afternoon as long as [place] feels pt appropriate.  CSW will update MD once [facility] liaison completes assessment.  CSW to continue to follow.  Alison Murray, MSW, [place] Work [phone]

## 2013-05-08 NOTE — Progress Notes (Signed)
Patient ID: Benjamin Moss, male   DOB: May 15, 1945, 68 y.o.   MRN: [id number] Progress Note from the Palliative Medicine Team at [place]: patient is awake and oriented  -continued conversation about GOC and discharge plan, he again verbalizes his wish for comfort and understands medical framework of a hospice facility     Objective: No Known Allergies Scheduled Meds: . docusate sodium  200 mg Oral BID  . feeding supplement (ENSURE COMPLETE)  237 mL Oral TID BM  . ipratropium-albuterol  3 mL Nebulization TID  . morphine CONCENTRATE  5 mg Oral Q6H  . nicotine  21 mg Transdermal Daily  . senna  2 tablet Oral QHS   Continuous Infusions: . 0.9 % NaCl with KCl 40 mEq / L 20 mL/hr (05/07/13 1913)   PRN Meds:.acetaminophen, acetaminophen, bisacodyl, ibuprofen, LORazepam, morphine injection, morphine CONCENTRATE, ondansetron (ZOFRAN) IV, ondansetron, promethazine  BP 96/67  Pulse 81  Temp(Src) 99.2 F (37.3 C) (Oral)  Resp 16  Ht 5\' 9"  (1.753 m)  Wt 45.7 kg (100 lb 12 oz)  BMI 14.87 kg/m2  SpO2 93%   PPS:30 %  Pain Score: c/o headache unable to scale   Intake/Output Summary (Last 24 hours) at 05/08/13 0910 Last data filed at 05/08/13 0220  Gross per 24 hour  Intake    200 ml  Output    200 ml  Net      0 ml        Physical Exam:  General: frail, cachectic black male NAD  HEENT: Poor dentati on, dry buccal membranes, no exudate  Chest: Diminished in bases CTA  CVS: tachycardic  Abdomen: soft NT, flat  Ext: without edeam Skin: warm and dry  Neuro: alert and oriented X3     Labs: CBC    Component Value Date/Time   WBC 7.0 05/07/2013 1019   WBC 9.6 04/15/2013 0619   RBC 3.69* 05/07/2013 1019   RBC 3.00* 04/15/2013 0619   HGB 11.1* 05/07/2013 1019   HGB 9.1* 04/15/2013 0619   HCT 33.2* 05/07/2013 1019   HCT 26.9* 04/15/2013 0619   PLT 355 05/07/2013 1019   PLT 301 04/15/2013 0619   MCV 90.0 05/07/2013 1019   MCV 89.7 04/15/2013 0619   MCH 30.1 05/07/2013 1019   MCH 30.3 04/15/2013 0619   MCHC 33.4 05/07/2013 1019   MCHC 33.8 04/15/2013 0619   RDW 14.8* 05/07/2013 1019   RDW 14.6 04/15/2013 0619   LYMPHSABS 1.6 05/07/2013 1019   LYMPHSABS 0.4* 04/13/2013 1233   MONOABS 0.7 05/07/2013 1019   MONOABS 0.3 04/13/2013 1233   EOSABS 0.2 05/07/2013 1019   EOSABS 0.0 04/13/2013 1233   BASOSABS 0.1 05/07/2013 1019   BASOSABS 0.0 04/13/2013 1233    BMET    Component Value Date/Time   NA 139 05/07/2013 1019   NA 140 04/15/2013 0619   K 2.9* 05/07/2013 1019   K 3.9 04/15/2013 0619   CL 104 04/15/2013 0619   CL 101 09/18/2012 1040   CO2 22 05/07/2013 1019   CO2 25 04/15/2013 0619   GLUCOSE 139 05/07/2013 1019   GLUCOSE 99 04/15/2013 0619   GLUCOSE 94 09/18/2012 1040   BUN 22.6 05/07/2013 1019   BUN 23 04/15/2013 0619   CREATININE 1.1 05/07/2013 1019   CREATININE 0.87 04/15/2013 0619   CALCIUM 12.2* 05/07/2013 1019   CALCIUM 9.3 04/15/2013 0619   GFRNONAA 88* 04/15/2013 0619   GFRAA >90 04/15/2013 0619    CMP  Component Value Date/Time   NA 139 05/07/2013 1019   NA 140 04/15/2013 0619   K 2.9* 05/07/2013 1019   K 3.9 04/15/2013 0619   CL 104 04/15/2013 0619   CL 101 09/18/2012 1040   CO2 22 05/07/2013 1019   CO2 25 04/15/2013 0619   GLUCOSE 139 05/07/2013 1019   GLUCOSE 99 04/15/2013 0619   GLUCOSE 94 09/18/2012 1040   BUN 22.6 05/07/2013 1019   BUN 23 04/15/2013 0619   CREATININE 1.1 05/07/2013 1019   CREATININE 0.87 04/15/2013 0619   CALCIUM 12.2* 05/07/2013 1019   CALCIUM 9.3 04/15/2013 0619   PROT 7.4 05/07/2013 1019   PROT 7.3 04/13/2013 1025   ALBUMIN 2.8* 05/07/2013 1019   ALBUMIN 3.0* 04/13/2013 1025   AST 9 05/07/2013 1019   AST 13 04/13/2013 1025   ALT 8 05/07/2013 1019   ALT 6 04/13/2013 1025   ALKPHOS 73 05/07/2013 1019   ALKPHOS 72 04/13/2013 1025   BILITOT 0.25 05/07/2013 1019   BILITOT 0.4 04/13/2013 1025   GFRNONAA 88* 04/15/2013 0619   GFRAA >90 04/15/2013 0619    Assessment and Plan: 1. Code Status:DNR/DNI-comfort is main focus of care 2. Symptom Control: 1. Anxiety/Agitation:  Ativan 1 mg IV every 4 hrs prn 2. Pain/Dyspnea: Morphine 1 mg IV every 1 hr prn                                 Roxanol  5 mg every 6 hr PO scheduled    3. Psycho/Social:  Emotional support offered to patient. He speaks to "wanting to put my things in storage, because I won't see them again".  He tells me [name] will help him with all that        He understands his limited prognosis and hopes for comfort and someone to "take care of him"  4. Disposition:  Hopeful for hospice facility for end of life care   Wadie Lessen NP  Palliative Medicine Team Team Phone # [phone] Pager [phone]  Discussed with Cristal Deer LCSW 1

## 2013-05-08 NOTE — Discharge Instructions (Signed)
Hospice Care  °Hospice is a care service which can be used by people who are terminally ill and in whom healing is no longer thought possible. Hospice care is for people believed to have no more than 6 months to live. It is meant to help with the two largest fears near the end of life (the fears of dying and of being alone), as well as pain management, and an attempt to allow people to pass away comfortably at home.  °Hospice staff: °· Administer appropriate pain relief. °· Provide nursing care. °· Offer reassurance and support to loved ones and family members. °· Provide services to keep people comfortable at home or in a hospice facility. °Together, you can see to it that your loved one is not alone during this last and important phase of life. °You, your family, and your caregivers help you decide when hospice services should begin. If your condition improves or the disease goes into remission, you can be discharged from the hospice program. You can return to hospice care at a later time if needed. °The hospice philosophy recognizes death as the final stage of life. It helps patients continue an alert, pain-free life, and manage symptoms while surrounded by their loved ones. Hospice affirms life without hurrying death. Hospice care treats the person rather than the disease. It emphasizes quality of life with family-centered care. Hospice care involves the patient and family and helps them make decisions.  °The care is designed to: °· Relieve or decrease pain. °· Control other problems. °· Provide as much quality time as possible. °· Allow people to die with dignity. °Unlike other medical care, the focus is no longer on curing disease. The goal of hospice care is to offer as high a quality of life as possible during the end of life. In this way, the last days of life may be spent with dignity.  °With hospice care, instead of spending the last weeks or months in a hospital, a person is with loved ones in the home  or a homelike setting. About 90 percent of hospice care is provided at home. But hospice is available wherever a person lives, including a nursing home or assisted-living residence. Some residential hospices designed specifically for hospice care also exist. Hospice care is available for many types of terminal illnesses. °Hospice services are meant to serve both the patient and family members. °· Comfort. In most cases, the individual stays in his or her home or in homelike surroundings instead of in a hospital. The core of hospice is a cooperative effort by family, friends and a team of professional and volunteer caregivers working together to meet your loved one's needs. This team supplies all necessary medicines and equipment. It works with both the person involved and family members to relieve pain and symptoms. °· Support. Individuals enjoy the support of loved ones by receiving much of the basic care from family and friends. A nurse may lead the team and coordinates the day-to-day care. A doctor is also part of the team. Chaplains and social workers are available to counsel the family and their loved one. They make sure emotional, spiritual, and social needs are being met. Trained volunteers perform a wide variety of tasks as needed, such as: °· Providing companionship. °· Doing light housekeeping. °· Preparing meals. °· Running errands. °· Providing respite for the family. °· Improving quality of life. Caring for someone who is dying is emotionally and physically demanding. This can be particularly true for family members   who are primary caregivers. But you can take comfort in knowing that hospice is an act of love that can improve the quality of life for all involved. Professionals are often available to tend to the needs of grieving family members as well.  Spiritual Care. Hospice care emphasizes the spiritual needs of you and your family. People differ in their spiritual needs and religious beliefs so  spiritual care is individualized to meet the persons' and family's needs. It may include helping you to look at what death means to you, to say good-bye, or to perform a specific religious ceremony or ritual. HOW TO SELECT A PROGRAM Most hospice programs are run by nonprofit, independent organizations. Some are affiliated with hospitals, nursing homes or home health care agencies. Some are for-profit organizations. You can learn about existing hospice programs in your area from your health caregivers. ASK THE FOLLOWING:  What services are available to the patient?  What services are offered to the family?  Are bereavement services available?  How involved are the family members?  How involved is the doctor?  Who makes up the hospice care team? How are they trained or screened?  How will the individual's pain and symptoms be managed?  If circumstances change, can services be provided in different settings, such as the home or the hospital?  Is the program reviewed and licensed by the state or certified in some other way?  Are all costs covered by insurance? How much you pay for hospice care can vary greatly. It depends on the length and type of care necessary and your insurance coverage. Medicare and most private insurance plans, including managed care organizations, cover hospice care. Hospice is also covered by Medicaid in most states. Some hospice programs provide services on a sliding fee scale, based on your ability to pay. They may also provide some durable medical equipment for support within the home. Document Released: 07/08/2003 Document Revised: 06/13/2011 Document Reviewed: 03/21/2005 [facility] Patient Information 2014 [place], [place].

## 2013-05-09 NOTE — Consult Note (Signed)
I have reviewed and discussed the care of this patient in detail with the nurse practitioner including pertinent patient records, physical exam findings and data. I agree with details of this encounter.  

## 2013-06-02 DEATH — deceased

## 2014-04-16 IMAGING — CT CT ANGIO CHEST
1 of 2 series · 19 of 32 positions shown · IV contrast (OMNIPAQUE 300)
Comparison: Multiple prior CT exams of the chest. The latest is
dated 09/18/2012.

CLINICAL DATA: Shortness of breath and history of lung carcinoma.

EXAM:
CT ANGIOGRAPHY CHEST WITH CONTRAST
TECHNIQUE: Multidetector CT imaging of the chest was performed using the
standard protocol during bolus administration of intravenous
contrast. Multiplanar CT image reconstructions including MIPs were
obtained to evaluate the vascular anatomy.
CONTRAST:  100mL OMNIPAQUE IOHEXOL 350 MG/ML SOLN

[Series 5: thins for pacs · axial · 0.64mm/px · z∈[-301,-14]mm · 19 of 315 slices shown]
[im 14/315  lung]
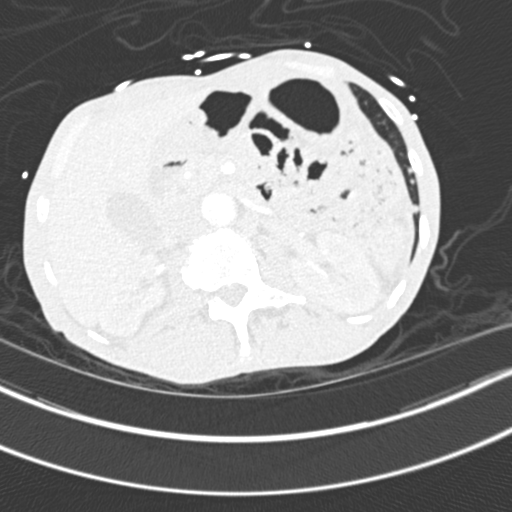
[im 28/315  soft-tissue]
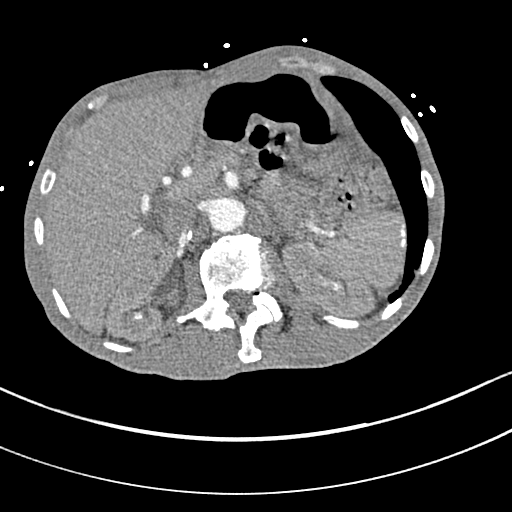
[im 41/315  lung]
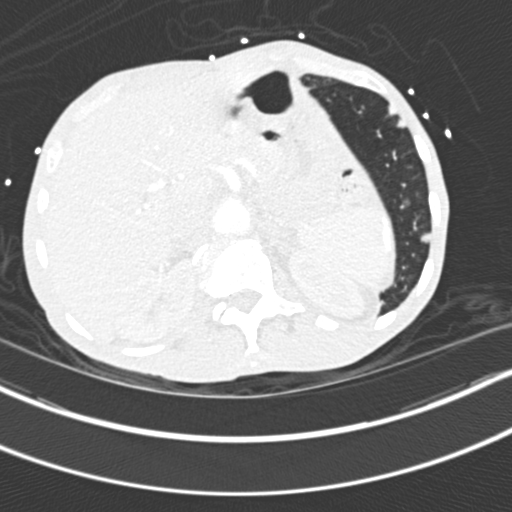
[im 69/315  soft-tissue]
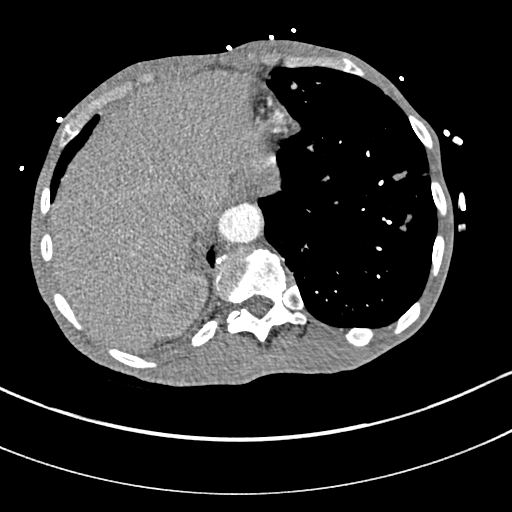
[im 82/315  lung]
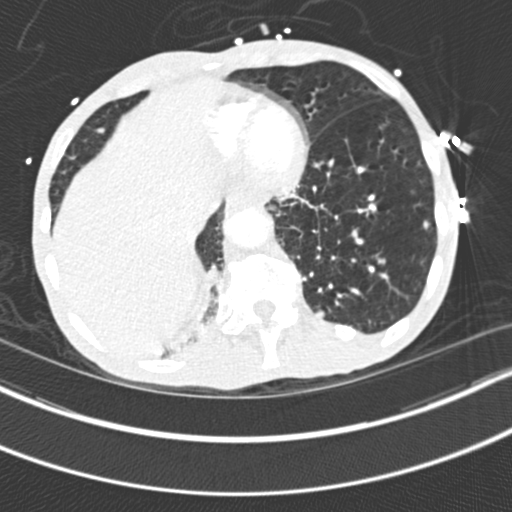
[im 96/315  soft-tissue]
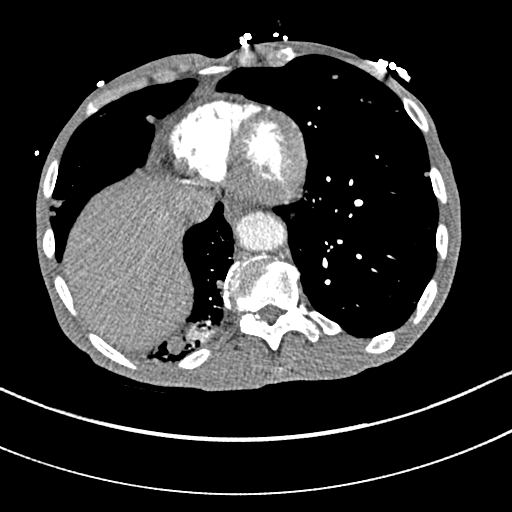
[im 110/315  lung]
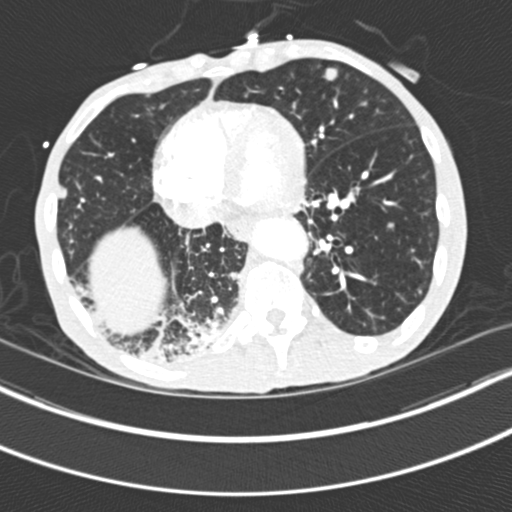
[im 123/315  soft-tissue]
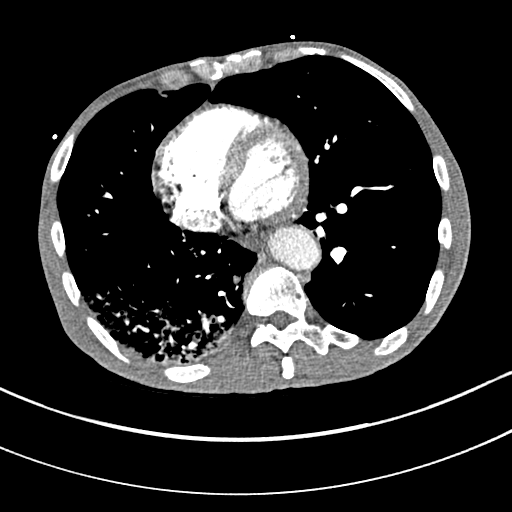
[im 137/315  lung]
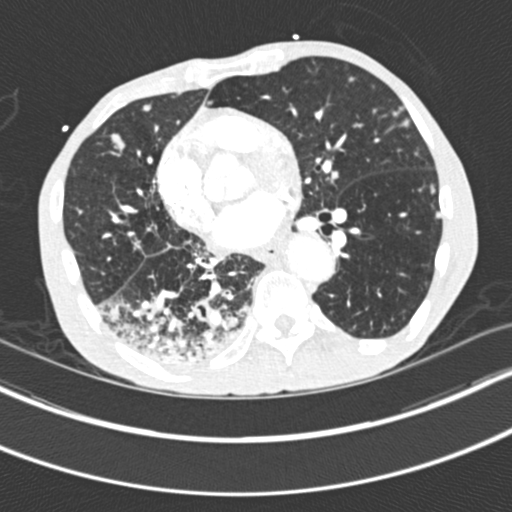
[im 164/315  soft-tissue]
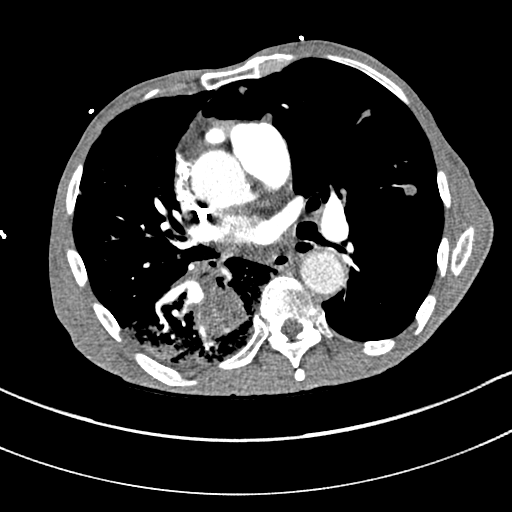
[im 178/315  lung]
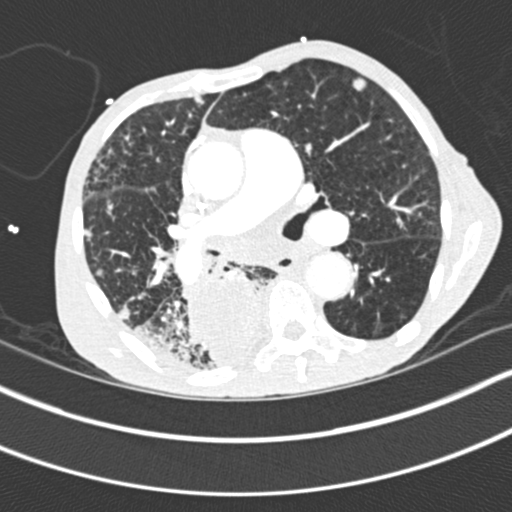
[im 192/315  soft-tissue]
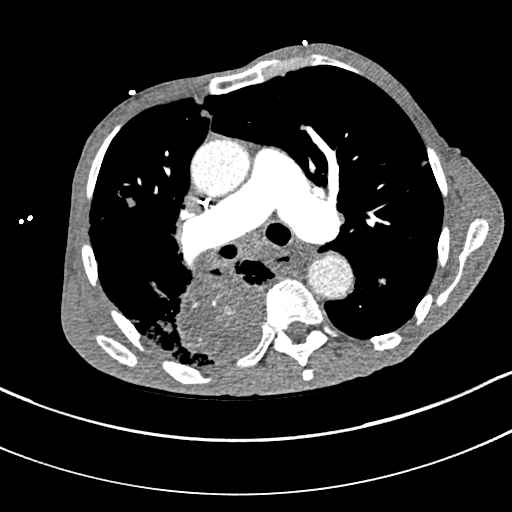
[im 205/315  lung]
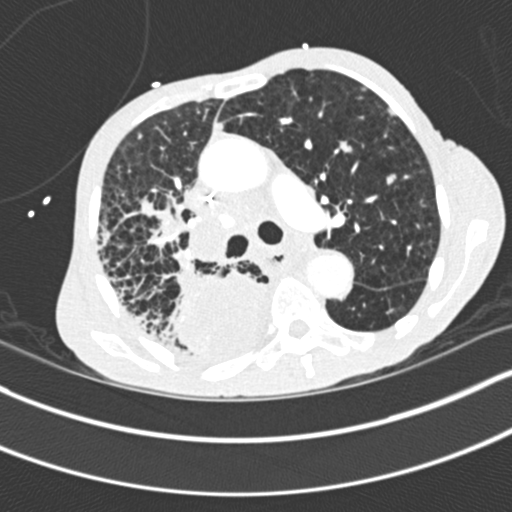
[im 219/315  soft-tissue]
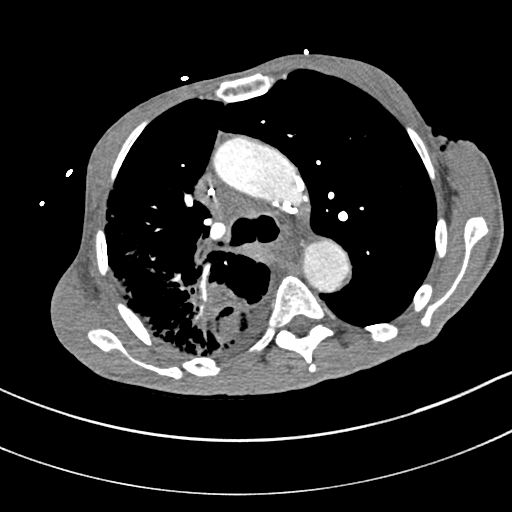
[im 233/315  lung]
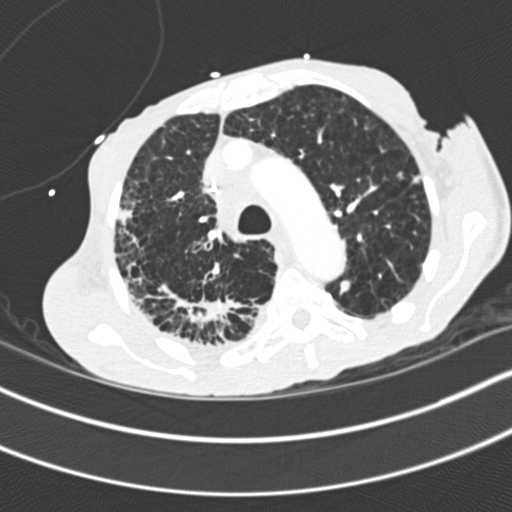
[im 246/315  soft-tissue]
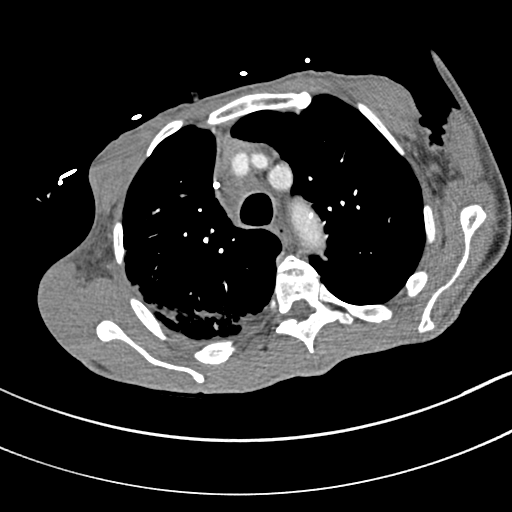
[im 274/315  lung]
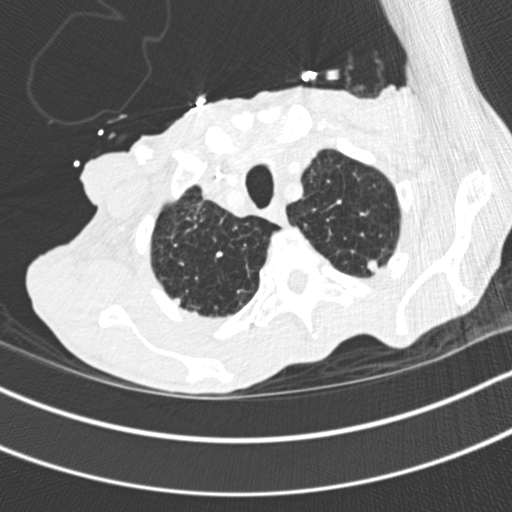
[im 287/315  soft-tissue]
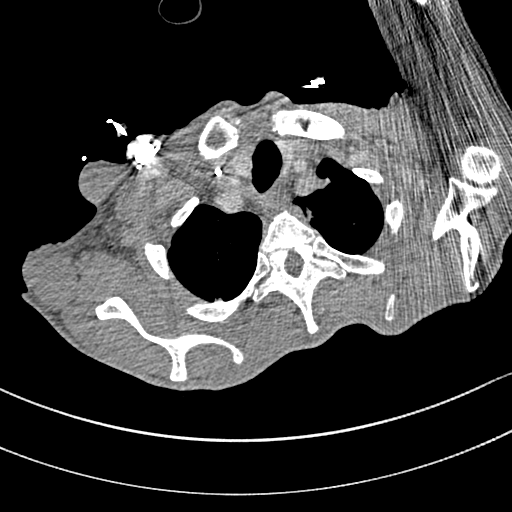
[im 301/315  lung]
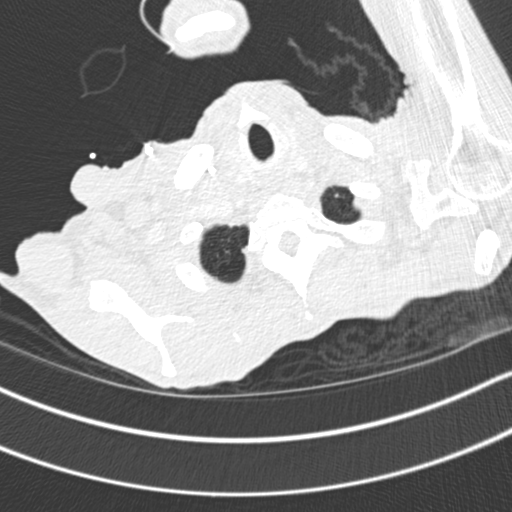

[19 of 32 positions shown; findings below may reference images not displayed]

FINDINGS: The pulmonary arteries are well opacified at the time of the
examination. There is no evidence of pulmonary embolism.

There is significant progression of lung carcinoma and metastatic
disease in the chest. The primary mass centered in the superior
segment of the right lower lobe shows significant interval
enlargement with maximal transverse dimensions of approximately
x 6.4 cm. Adjacent parenchymal changes likely reflect post radiation
therapy changes. There may be lymphangitic spread of tumor in the
right lower lobe, as well, which is strongly suspected given the
multitude of other metastatic lesions identified.

Innumerable new metastatic nodules are identified throughout all
lobes of both lungs. Largest measured nodules are a 1.2 cm nodule in
the right middle lobe, 1.3 cm nodule at the posterior basilar right
lower lobe and 1.3 cm nodule in the left lower lobe. There are too
many new metastatic nodules to count.

Progressive lymphadenopathy is also identified in the right hilum
and mediastinum. Largest right hilar lymph node measures
approximately 2.3 x 3.0 cm. Subcarinal lymphadenopathy measures
cm in short axis.

There is a trace amount of pleural fluid on the right. No
pericardial fluid is identified. The heart size is normal. No bony
lesions are identified.

Review of the MIP images confirms the above findings.
IMPRESSION: Marked progression of metastatic lung carcinoma in the chest with
significant enlargement of the primary right lower lobe mass as well
as innumerable new metastatic nodules throughout both lungs. There
may also be lymphangitic spread of tumor in the right lower lobe.

## 2015-09-22 ENCOUNTER — Other Ambulatory Visit: Payer: Self-pay | Admitting: Nurse Practitioner
